# Patient Record
Sex: Female | Born: 1945 | Race: White | Hispanic: No | Marital: Married | Smoking: Never smoker
Health system: Southern US, Community
[De-identification: ages and names within clinical notes are randomized; demographics above are authoritative.]

## PROBLEM LIST (undated history)

## (undated) DIAGNOSIS — B019 Varicella without complication: Secondary | ICD-10-CM

## (undated) DIAGNOSIS — Z9889 Other specified postprocedural states: Secondary | ICD-10-CM

## (undated) DIAGNOSIS — R609 Edema, unspecified: Secondary | ICD-10-CM

## (undated) DIAGNOSIS — R739 Hyperglycemia, unspecified: Secondary | ICD-10-CM

## (undated) DIAGNOSIS — R112 Nausea with vomiting, unspecified: Secondary | ICD-10-CM

## (undated) DIAGNOSIS — R7303 Prediabetes: Secondary | ICD-10-CM

## (undated) DIAGNOSIS — M8580 Other specified disorders of bone density and structure, unspecified site: Secondary | ICD-10-CM

## (undated) DIAGNOSIS — I10 Essential (primary) hypertension: Secondary | ICD-10-CM

## (undated) DIAGNOSIS — E785 Hyperlipidemia, unspecified: Secondary | ICD-10-CM

## (undated) HISTORY — PX: ABDOMINAL HYSTERECTOMY: SHX81

## (undated) HISTORY — PX: CHOLECYSTECTOMY: SHX55

## (undated) HISTORY — PX: BREAST SURGERY: SHX581

## (undated) HISTORY — PX: FRACTURE SURGERY: SHX138

## (undated) HISTORY — PX: OTHER SURGICAL HISTORY: SHX169

## (undated) HISTORY — PX: MANDIBLE FRACTURE SURGERY: SHX706

## (undated) HISTORY — PX: WRIST SURGERY: SHX841

## (undated) HISTORY — PX: FLEXIBLE SIGMOIDOSCOPY: SHX1649

---

## 1999-03-27 HISTORY — PX: BREAST BIOPSY: SHX20

## 1999-03-27 HISTORY — PX: BREAST EXCISIONAL BIOPSY: SUR124

## 2004-07-11 ENCOUNTER — Ambulatory Visit: Payer: Self-pay | Admitting: Internal Medicine

## 2005-10-25 ENCOUNTER — Ambulatory Visit: Payer: Self-pay | Admitting: Internal Medicine

## 2006-10-30 ENCOUNTER — Ambulatory Visit: Payer: Self-pay | Admitting: Internal Medicine

## 2007-03-26 ENCOUNTER — Ambulatory Visit: Payer: Self-pay | Admitting: Physician Assistant

## 2007-11-03 ENCOUNTER — Ambulatory Visit: Payer: Self-pay | Admitting: Internal Medicine

## 2008-11-12 ENCOUNTER — Ambulatory Visit: Payer: Self-pay | Admitting: Internal Medicine

## 2009-11-18 ENCOUNTER — Ambulatory Visit: Payer: Self-pay | Admitting: Internal Medicine

## 2010-04-11 ENCOUNTER — Ambulatory Visit: Payer: Self-pay | Admitting: Gastroenterology

## 2010-12-07 ENCOUNTER — Ambulatory Visit: Payer: Self-pay | Admitting: Internal Medicine

## 2011-12-27 ENCOUNTER — Ambulatory Visit: Payer: Self-pay | Admitting: Internal Medicine

## 2012-07-09 ENCOUNTER — Emergency Department: Payer: Self-pay | Admitting: Emergency Medicine

## 2012-08-08 ENCOUNTER — Encounter: Payer: Self-pay | Admitting: Nurse Practitioner

## 2012-08-08 ENCOUNTER — Encounter: Payer: Self-pay | Admitting: Cardiothoracic Surgery

## 2012-08-24 ENCOUNTER — Encounter: Payer: Self-pay | Admitting: Nurse Practitioner

## 2012-08-24 ENCOUNTER — Encounter: Payer: Self-pay | Admitting: Cardiothoracic Surgery

## 2013-01-29 ENCOUNTER — Ambulatory Visit: Payer: Self-pay | Admitting: Internal Medicine

## 2013-09-26 DIAGNOSIS — R7303 Prediabetes: Secondary | ICD-10-CM | POA: Insufficient documentation

## 2013-09-26 DIAGNOSIS — M8580 Other specified disorders of bone density and structure, unspecified site: Secondary | ICD-10-CM | POA: Insufficient documentation

## 2013-09-26 DIAGNOSIS — I10 Essential (primary) hypertension: Secondary | ICD-10-CM | POA: Insufficient documentation

## 2013-09-26 DIAGNOSIS — R609 Edema, unspecified: Secondary | ICD-10-CM | POA: Insufficient documentation

## 2013-09-26 DIAGNOSIS — E782 Mixed hyperlipidemia: Secondary | ICD-10-CM | POA: Insufficient documentation

## 2013-09-26 DIAGNOSIS — E669 Obesity, unspecified: Secondary | ICD-10-CM | POA: Insufficient documentation

## 2014-02-04 ENCOUNTER — Ambulatory Visit: Payer: Self-pay | Admitting: Internal Medicine

## 2014-06-11 ENCOUNTER — Ambulatory Visit: Payer: Self-pay | Admitting: Internal Medicine

## 2014-06-11 DIAGNOSIS — Z0000 Encounter for general adult medical examination without abnormal findings: Secondary | ICD-10-CM | POA: Insufficient documentation

## 2014-08-30 ENCOUNTER — Encounter: Payer: Self-pay | Admitting: *Deleted

## 2014-08-31 ENCOUNTER — Ambulatory Visit
Admission: RE | Admit: 2014-08-31 | Discharge: 2014-08-31 | Disposition: A | Discharge status: Home / Self Care | Payer: PPO | Source: Ambulatory Visit | Attending: Gastroenterology | Admitting: Gastroenterology

## 2014-08-31 ENCOUNTER — Ambulatory Visit: Payer: PPO | Admitting: Anesthesiology

## 2014-08-31 ENCOUNTER — Encounter: Payer: Self-pay | Admitting: *Deleted

## 2014-08-31 ENCOUNTER — Encounter
Admission: RE | Disposition: A | Discharge status: Home / Self Care | Payer: Self-pay | Source: Ambulatory Visit | Attending: Gastroenterology

## 2014-08-31 DIAGNOSIS — K2970 Gastritis, unspecified, without bleeding: Secondary | ICD-10-CM | POA: Insufficient documentation

## 2014-08-31 DIAGNOSIS — R1013 Epigastric pain: Secondary | ICD-10-CM | POA: Diagnosis not present

## 2014-08-31 DIAGNOSIS — I10 Essential (primary) hypertension: Secondary | ICD-10-CM | POA: Insufficient documentation

## 2014-08-31 DIAGNOSIS — K648 Other hemorrhoids: Secondary | ICD-10-CM | POA: Diagnosis not present

## 2014-08-31 DIAGNOSIS — Z79899 Other long term (current) drug therapy: Secondary | ICD-10-CM | POA: Diagnosis not present

## 2014-08-31 DIAGNOSIS — L83 Acanthosis nigricans: Secondary | ICD-10-CM | POA: Insufficient documentation

## 2014-08-31 DIAGNOSIS — K644 Residual hemorrhoidal skin tags: Secondary | ICD-10-CM | POA: Diagnosis not present

## 2014-08-31 DIAGNOSIS — K317 Polyp of stomach and duodenum: Secondary | ICD-10-CM | POA: Diagnosis not present

## 2014-08-31 DIAGNOSIS — Z7982 Long term (current) use of aspirin: Secondary | ICD-10-CM | POA: Insufficient documentation

## 2014-08-31 DIAGNOSIS — E785 Hyperlipidemia, unspecified: Secondary | ICD-10-CM | POA: Diagnosis not present

## 2014-08-31 HISTORY — DX: Essential (primary) hypertension: I10

## 2014-08-31 HISTORY — PX: ESOPHAGOGASTRODUODENOSCOPY: SHX5428

## 2014-08-31 HISTORY — DX: Varicella without complication: B01.9

## 2014-08-31 HISTORY — DX: Hyperglycemia, unspecified: R73.9

## 2014-08-31 HISTORY — PX: COLONOSCOPY WITH PROPOFOL: SHX5780

## 2014-08-31 HISTORY — DX: Hyperlipidemia, unspecified: E78.5

## 2014-08-31 HISTORY — DX: Other specified disorders of bone density and structure, unspecified site: M85.80

## 2014-08-31 SURGERY — COLONOSCOPY WITH PROPOFOL
Anesthesia: General

## 2014-08-31 MED ORDER — PROPOFOL 10 MG/ML IV BOLUS
INTRAVENOUS | Status: DC | PRN
  Administered 2014-08-31: 30 mg via INTRAVENOUS

## 2014-08-31 MED ORDER — FENTANYL CITRATE (PF) 100 MCG/2ML IJ SOLN
INTRAMUSCULAR | Status: DC | PRN
  Administered 2014-08-31: 50 ug via INTRAVENOUS

## 2014-08-31 MED ORDER — PROPOFOL INFUSION 10 MG/ML OPTIME
INTRAVENOUS | Status: DC | PRN
  Administered 2014-08-31: 75 ug/kg/min via INTRAVENOUS

## 2014-08-31 MED ORDER — EPHEDRINE SULFATE 50 MG/ML IJ SOLN
INTRAMUSCULAR | Status: DC | PRN
  Administered 2014-08-31 (×2): 10 mg via INTRAVENOUS

## 2014-08-31 MED ORDER — LIDOCAINE HCL (PF) 1 % IJ SOLN
2.0000 mL | Freq: Once | INTRAMUSCULAR | Status: AC
  Administered 2014-08-31: 0.05 mL via INTRADERMAL

## 2014-08-31 MED ORDER — ALFENTANIL 500 MCG/ML IJ INJ
INJECTION | INTRAMUSCULAR | Status: DC | PRN
  Administered 2014-08-31: 500 ug via INTRAVENOUS

## 2014-08-31 MED ORDER — GLYCOPYRROLATE 0.2 MG/ML IJ SOLN
INTRAMUSCULAR | Status: DC | PRN
  Administered 2014-08-31: 0.2 mg via INTRAVENOUS

## 2014-08-31 MED ORDER — SODIUM CHLORIDE 0.9 % IV SOLN
INTRAVENOUS | Status: DC
  Administered 2014-08-31: 1000 mL via INTRAVENOUS
  Administered 2014-08-31: 13:00:00 via INTRAVENOUS

## 2014-08-31 MED ORDER — LIDOCAINE HCL (PF) 1 % IJ SOLN
INTRAMUSCULAR | Status: AC
  Administered 2014-08-31: 0.05 mL via INTRADERMAL
  Filled 2014-08-31: qty 2

## 2014-08-31 MED ORDER — SODIUM CHLORIDE 0.9 % IV SOLN: INTRAVENOUS | Status: DC

## 2014-08-31 MED ORDER — MIDAZOLAM HCL 5 MG/5ML IJ SOLN
INTRAMUSCULAR | Status: DC | PRN
  Administered 2014-08-31: 1 mg via INTRAVENOUS

## 2014-08-31 MED ORDER — LIDOCAINE HCL (CARDIAC) 20 MG/ML IV SOLN
INTRAVENOUS | Status: DC | PRN
  Administered 2014-08-31: 100 mg via INTRAVENOUS

## 2014-08-31 NOTE — Transfer of Care (Signed)
Immediate Anesthesia Transfer of Care Note  Patient: Shelly Armstrong  Procedure(s) Performed: Procedure(s): COLONOSCOPY WITH PROPOFOL (N/A) ESOPHAGOGASTRODUODENOSCOPY (EGD) (N/A)  Patient Location: PACU  Anesthesia Type:General  Level of Consciousness: sedated  Airway & Oxygen Therapy: Patient Spontanous Breathing and Patient connected to nasal cannula oxygen  Post-op Assessment: Report given to RN and Post -op Vital signs reviewed and stable  Post vital signs: Reviewed and stable  Last Vitals:  Filed Vitals:   08/31/14 1430  BP: 97/54  Pulse: 62  Temp: 36 C  Resp: 11    Complications: No apparent anesthesia complications

## 2014-08-31 NOTE — H&P (Signed)
Outpatient short stay form Pre-procedure 08/31/2014 12:37 PM Lollie Sails MD  Primary Physician: Dr. Frazier Richards  Reason for visit:  Recent episode of diverticulitis treated with Cipro and Flagyl and recurrent requiring a second course of anti-biotics. Abnormal CT scan at that time. History of dyspepsia and intermittent epigastric pain. The epigastric abdominal pain may only occur 3 or 4 times a year and will last only for half an hour at a time. It is quite intense when it happens however. Never lasted longer enough for her to go to the hospital with it. He does have a history of a cholecystectomy remote done laparoscopically. He suddenly she had several bouts of diverticulitis requiring biotics. Her CT scan was done on 06/11/2014 for lower abdominal pain nausea vomiting diarrhea interpretation showed a 5-6 cm area of moderate to severe inflammation affecting the sigmoid and sigmoid mesentery. Palpable with acute diverticulitis however follow-up" neoplasm was recommended. There were no other acute or inflammatory findings in the abdomen or pelvis. No Dilated or inflamed loops of small bowel.  History of present illness:  As above    Current facility-administered medications:  .  0.9 %  sodium chloride infusion, , Intravenous, Continuous, Lollie Sails, MD .  0.9 %  sodium chloride infusion, , Intravenous, Continuous, Lollie Sails, MD .  lidocaine (PF) (XYLOCAINE) 1 % injection 2 mL, 2 mL, Intradermal, Once, Lollie Sails, MD  Prescriptions prior to admission  Medication Sig Dispense Refill Last Dose  . acetaminophen (TYLENOL) 500 MG tablet Take 1,000 mg by mouth every 6 (six) hours as needed for moderate pain.     Marland Kitchen aspirin 81 MG tablet Take 81 mg by mouth daily.     . Calcium-Vitamin D (CALTRATE 600 PLUS-VIT D PO) Take 1 tablet by mouth 2 (two) times daily.     Marland Kitchen losartan-hydrochlorothiazide (HYZAAR) 100-25 MG per tablet Take 1 tablet by mouth daily.   08/31/2014 at 07300   . Multiple Vitamin (MULTIVITAMIN WITH MINERALS) TABS tablet Take 1 tablet by mouth daily.     Marland Kitchen omeprazole (PRILOSEC) 40 MG capsule Take 40 mg by mouth daily.     . pravastatin (PRAVACHOL) 40 MG tablet Take 40 mg by mouth daily.        Allergies  Allergen Reactions  . Septra [Sulfamethoxazole-Trimethoprim]      Past Medical History  Diagnosis Date  . Hypertension   . Hyperlipidemia   . Hyperglycemia   . Osteopenia   . Chickenpox     Review of systems:      Physical Exam    Heart and lungs: Regular rate and rhythm without rub or gallop lungs are bilaterally clear    HEENT: Normocephalic atraumatic eyes are anicteric    Other:     Pertinant exam for procedure: Soft nontender nondistended bowel sounds positive and normal active    Planned proceedures: EGD and colonoscopy. I have discussed the risks benefits and complications of procedures to include not limited to bleeding, infection, perforation and the risk of sedation and the patient wishes to proceed. I have discussed the risks benefits and complications of procedures to include not limited to bleeding, infection, perforation and the risk of sedation and the patient wishes to proceed.    Lollie Sails, MD Gastroenterology 08/31/2014  12:37 PM

## 2014-08-31 NOTE — Anesthesia Postprocedure Evaluation (Signed)
  Anesthesia Post-op Note  Patient: Shelly Armstrong  Procedure(s) Performed: Procedure(s): COLONOSCOPY WITH PROPOFOL (N/A) ESOPHAGOGASTRODUODENOSCOPY (EGD) (N/A)  Anesthesia type:General  Patient location: PACU  Post pain: Pain level controlled  Post assessment: Post-op Vital signs reviewed, Patient's Cardiovascular Status Stable, Respiratory Function Stable, Patent Airway and No signs of Nausea or vomiting  Post vital signs: Reviewed and stable  Last Vitals:  Filed Vitals:   08/31/14 1458  BP: 146/57  Pulse: 57  Temp:   Resp: 9    Level of consciousness: awake, alert  and patient cooperative  Complications: No apparent anesthesia complications

## 2014-08-31 NOTE — Op Note (Addendum)
[facility] Gastroenterology Patient Name: Shelly Armstrong Procedure Date: 08/31/2014 12:58 PM MRN: [id number] Account #: [id number] Date of Birth: 1945/06/07 Admit Type: Outpatient Age: 69 Room: [facility] ENDO ROOM 3 Gender: Female Note Status: Supervisor Override Procedure:         Upper GI endoscopy Indications:       Epigastric abdominal pain Providers:         Lollie Sails, MD Referring MD:      Ocie Cornfield. Ouida Sills, MD (Referring MD) Medicines:         Monitored Anesthesia Care Complications:     No immediate complications. Small ecchymosis noted in the                     upper esophagus from scope passage, possible small web or                     ring opened . Procedure:         Pre-Anesthesia Assessment:                    - ASA Grade Assessment: II - A patient with mild systemic                     disease.                    After obtaining informed consent, the endoscope was passed                     under direct vision. Throughout the procedure, the                     patient's blood pressure, pulse, and oxygen saturations                     were monitored continuously. The Olympus GIF-160 endoscope                     (S#. [id number]) was introduced through the mouth, and                     advanced to the third part of duodenum. The upper GI                     endoscopy was accomplished without difficulty. The patient                     tolerated the procedure well. The patient tolerated the                     procedure well. Findings:      The Z-line was regular. Biopsies were taken with a cold forceps for       histology. The distal esophagus is noted to be tortuous.      The exam of the esophagus was otherwise normal.      Patchy minimal inflammation characterized by erythema was found in the       gastric body and in the gastric antrum. Biopsies were taken with a cold       forceps for histology. Biopsies were taken with a cold forceps  for       histology.      Multiple pale plaque-like sessile polyps with no bleeding and no       stigmata of recent bleeding were  found in the gastric body. Biopsies       were taken with a cold forceps for histology.      The examined duodenum was normal.      The cardia and gastric fundus were normal on retroflexion otherwise.      The scope was reintroduced to remove a small squamous papilloma in the       distal third of the esophagus. On Withdrawl of the scope there is       noticed a small mucosal ecchymosis above an inlet patch in the upper       third of the esophagus, no deeper defect was seen. Impression:        - Z-line regular. Biopsied.                    - Bile gastritis. Biopsied.                    - Multiple gastric polyps. Biopsied.                    - Normal examined duodenum.                    -small papillomatous lesion in the distal third of the                     esophagus removed. Recommendation:    - Await pathology results.                    - Return to GI clinic in 3 weeks.                    - Clear liquid diet today.                    - Full liquid diet for 2 days. Procedure Code(s): --- Professional ---                    [phone], Esophagogastroduodenoscopy, flexible, transoral;                     with biopsy, single or multiple Diagnosis Code(s): --- Professional ---                    535.40, Other specified gastritis, without mention of                     hemorrhage                    211.1, Benign neoplasm of stomach                    789.06, Abdominal pain, epigastric CPT copyright 2014 American Medical Association. All rights reserved. The codes documented in this report are preliminary and upon coder review may  be revised to meet current compliance requirements. Lollie Sails, MD 08/31/2014 1:24:45 PM This report has been signed electronically. Number of Addenda: 0 Note Initiated On: 08/31/2014 12:58 PM      [facility]

## 2014-08-31 NOTE — Anesthesia Preprocedure Evaluation (Signed)
Anesthesia Evaluation  Patient identified by MRN, date of birth, ID band  Reviewed: Allergy & Precautions, NPO status , Patient's Chart, lab work & pertinent test results  Airway Mallampati: II   Neck ROM: Full    Dental  (+) Upper Dentures, Lower Dentures   Pulmonary          Cardiovascular hypertension, Pt. on medications     Neuro/Psych    GI/Hepatic   Endo/Other    Renal/GU      Musculoskeletal   Abdominal   Peds  Hematology   Anesthesia Other Findings   Reproductive/Obstetrics                             Anesthesia Physical Anesthesia Plan  ASA: II  Anesthesia Plan: General   Post-op Pain Management:    Induction: Intravenous  Airway Management Planned: Nasal Cannula  Additional Equipment:   Intra-op Plan:   Post-operative Plan:   Informed Consent: I have reviewed the patients History and Physical, chart, labs and discussed the procedure including the risks, benefits and alternatives for the proposed anesthesia with the patient or authorized representative who has indicated his/her understanding and acceptance.     Plan Discussed with:   Anesthesia Plan Comments:         Anesthesia Quick Evaluation

## 2014-08-31 NOTE — Op Note (Signed)
[facility] Gastroenterology Patient Name: Shelly Armstrong Procedure Date: 08/31/2014 12:58 PM MRN: [id number] Account #: [id number] Date of Birth: 04-17-1945 Admit Type: Outpatient Age: 69 Room: [facility] ENDO ROOM 3 Gender: Female Note Status: Finalized Procedure:         Colonoscopy Indications:       Epigastric abdominal pain, Abnormal CT of the GI tract,                     Follow-up of diverticulitis Providers:         Lollie Sails, MD Referring MD:      Ocie Cornfield. Ouida Sills, MD (Referring MD) Medicines:         Propofol per Anesthesia, Monitored Anesthesia Care Complications:     No immediate complications. Procedure:         Pre-Anesthesia Assessment:                    - ASA Grade Assessment: III - A patient with severe                     systemic disease.                    After obtaining informed consent, the colonoscope was                     passed under direct vision. Throughout the procedure, the                     patient's blood pressure, pulse, and oxygen saturations                     were monitored continuously. The Colonoscope was                     introduced through the anus and advanced to the the cecum,                     identified by appendiceal orifice and ileocecal valve. The                     colonoscopy was unusually difficult due to significant                     looping and a tortuous colon. Successful completion of the                     procedure was aided by changing the patient to a supine                     position, changing the patient to a prone position and                     using manual pressure. The patient tolerated the procedure                     well. The quality of the bowel preparation was good. Findings:      The sigmoid colon, descending colon, splenic flexure, transverse colon       and hepatic flexure were significantly tortuous.      Multiple medium-mouthed diverticula were found in the sigmoid  colon, in       the descending colon and in the transverse colon.      Non-bleeding external and  internal hemorrhoids were found during       retroflexion, during perianal exam and during anoscopy. The hemorrhoids       were small. Impression:        - Tortuous colon.                    - Diverticulosis in the sigmoid colon, in the descending                     colon and in the transverse colon.                    - Non-bleeding external and internal hemorrhoids.                    - No specimens collected. Recommendation:    - Repeat colonoscopy in 10 years for screening purposes. Procedure Code(s): --- Professional ---                    [phone], Colonoscopy, flexible; diagnostic, including                     collection of specimen(s) by brushing or washing, when                     performed (separate procedure) Diagnosis Code(s): --- Professional ---                    455.0, Internal hemorrhoids without mention of complication                    455.3, External hemorrhoids without mention of complication                    789.06, Abdominal pain, epigastric                    562.11, Diverticulitis of colon (without mention of                     hemorrhage)                    751.5, Other anomalies of intestine                    793.4, Nonspecific (abnormal) findings on radiological and                     other examination of gastrointestinal tract CPT copyright 2014 American Medical Association. All rights reserved. The codes documented in this report are preliminary and upon coder review may  be revised to meet current compliance requirements. Lollie Sails, MD 08/31/2014 2:07:08 PM This report has been signed electronically. Number of Addenda: 0 Note Initiated On: 08/31/2014 12:58 PM Scope Withdrawal Time: 0 hours 8 minutes 58 seconds  Total Procedure Duration: 0 hours 35 minutes 0 seconds       [facility]

## 2014-09-02 ENCOUNTER — Encounter: Payer: Self-pay | Admitting: Gastroenterology

## 2014-09-02 LAB — SURGICAL PATHOLOGY

## 2014-10-14 ENCOUNTER — Other Ambulatory Visit: Payer: PPO

## 2014-10-18 ENCOUNTER — Encounter: Payer: Self-pay | Admitting: *Deleted

## 2014-10-18 DIAGNOSIS — I10 Essential (primary) hypertension: Secondary | ICD-10-CM | POA: Diagnosis not present

## 2014-10-18 DIAGNOSIS — Z79899 Other long term (current) drug therapy: Secondary | ICD-10-CM | POA: Diagnosis not present

## 2014-10-18 DIAGNOSIS — H2511 Age-related nuclear cataract, right eye: Secondary | ICD-10-CM | POA: Diagnosis not present

## 2014-10-18 DIAGNOSIS — Z7982 Long term (current) use of aspirin: Secondary | ICD-10-CM | POA: Diagnosis not present

## 2014-10-18 DIAGNOSIS — E119 Type 2 diabetes mellitus without complications: Secondary | ICD-10-CM | POA: Diagnosis not present

## 2014-10-19 ENCOUNTER — Ambulatory Visit: Payer: PPO | Admitting: Anesthesiology

## 2014-10-19 ENCOUNTER — Ambulatory Visit
Admission: RE | Admit: 2014-10-19 | Discharge: 2014-10-19 | Disposition: A | Discharge status: Home / Self Care | Payer: PPO | Source: Ambulatory Visit | Attending: Ophthalmology | Admitting: Ophthalmology

## 2014-10-19 ENCOUNTER — Encounter
Admission: RE | Disposition: A | Discharge status: Home / Self Care | Payer: Self-pay | Source: Ambulatory Visit | Attending: Ophthalmology

## 2014-10-19 ENCOUNTER — Encounter: Payer: Self-pay | Admitting: *Deleted

## 2014-10-19 DIAGNOSIS — Z7982 Long term (current) use of aspirin: Secondary | ICD-10-CM | POA: Insufficient documentation

## 2014-10-19 DIAGNOSIS — H2511 Age-related nuclear cataract, right eye: Secondary | ICD-10-CM | POA: Insufficient documentation

## 2014-10-19 DIAGNOSIS — E119 Type 2 diabetes mellitus without complications: Secondary | ICD-10-CM | POA: Insufficient documentation

## 2014-10-19 DIAGNOSIS — I10 Essential (primary) hypertension: Secondary | ICD-10-CM | POA: Insufficient documentation

## 2014-10-19 DIAGNOSIS — Z79899 Other long term (current) drug therapy: Secondary | ICD-10-CM | POA: Insufficient documentation

## 2014-10-19 HISTORY — PX: CATARACT EXTRACTION W/PHACO: SHX586

## 2014-10-19 HISTORY — DX: Nausea with vomiting, unspecified: R11.2

## 2014-10-19 HISTORY — DX: Other specified postprocedural states: Z98.890

## 2014-10-19 HISTORY — DX: Edema, unspecified: R60.9

## 2014-10-19 SURGERY — PHACOEMULSIFICATION, CATARACT, WITH IOL INSERTION
Anesthesia: Monitor Anesthesia Care | Laterality: Right

## 2014-10-19 MED ORDER — CEFUROXIME OPHTHALMIC INJECTION 1 MG/0.1 ML
INJECTION | OPHTHALMIC | Status: AC
  Filled 2014-10-19: qty 0.1

## 2014-10-19 MED ORDER — MIDAZOLAM HCL 2 MG/2ML IJ SOLN
INTRAMUSCULAR | Status: DC | PRN
  Administered 2014-10-19: 1 mg via INTRAVENOUS

## 2014-10-19 MED ORDER — EPINEPHRINE HCL 1 MG/ML IJ SOLN
INTRAOCULAR | Status: DC | PRN
  Administered 2014-10-19: 250 mL

## 2014-10-19 MED ORDER — CARBACHOL 0.01 % IO SOLN
INTRAOCULAR | Status: DC | PRN
  Administered 2014-10-19: .5 mL via INTRAOCULAR

## 2014-10-19 MED ORDER — NA CHONDROIT SULF-NA HYALURON 40-17 MG/ML IO SOLN
INTRAOCULAR | Status: AC
  Filled 2014-10-19: qty 1

## 2014-10-19 MED ORDER — FENTANYL CITRATE (PF) 100 MCG/2ML IJ SOLN
INTRAMUSCULAR | Status: DC | PRN
  Administered 2014-10-19: 50 ug via INTRAVENOUS

## 2014-10-19 MED ORDER — POVIDONE-IODINE 5 % OP SOLN
1.0000 "application " | OPHTHALMIC | Status: AC | PRN
  Administered 2014-10-19: 1 via OPHTHALMIC

## 2014-10-19 MED ORDER — CEFUROXIME OPHTHALMIC INJECTION 1 MG/0.1 ML
INJECTION | OPHTHALMIC | Status: DC | PRN
  Administered 2014-10-19: 0.1 mL via INTRACAMERAL

## 2014-10-19 MED ORDER — MOXIFLOXACIN HCL 0.5 % OP SOLN: 1.0000 [drp] | Freq: Once | OPHTHALMIC | Status: DC

## 2014-10-19 MED ORDER — POVIDONE-IODINE 5 % OP SOLN
OPHTHALMIC | Status: AC
  Filled 2014-10-19: qty 30

## 2014-10-19 MED ORDER — TETRACAINE HCL 0.5 % OP SOLN
OPHTHALMIC | Status: AC
  Filled 2014-10-19: qty 2

## 2014-10-19 MED ORDER — MOXIFLOXACIN HCL 0.5 % OP SOLN
OPHTHALMIC | Status: AC
  Filled 2014-10-19: qty 3

## 2014-10-19 MED ORDER — TETRACAINE HCL 0.5 % OP SOLN
1.0000 [drp] | OPHTHALMIC | Status: AC | PRN
  Administered 2014-10-19: 1 [drp] via OPHTHALMIC

## 2014-10-19 MED ORDER — ARMC OPHTHALMIC DILATING GEL
1.0000 "application " | OPHTHALMIC | Status: AC | PRN
  Administered 2014-10-19 (×2): 1 via OPHTHALMIC

## 2014-10-19 MED ORDER — EPINEPHRINE HCL 1 MG/ML IJ SOLN
INTRAMUSCULAR | Status: AC
  Filled 2014-10-19: qty 1

## 2014-10-19 MED ORDER — MOXIFLOXACIN HCL 0.5 % OP SOLN
OPHTHALMIC | Status: DC | PRN
  Administered 2014-10-19: 2 [drp] via OPHTHALMIC

## 2014-10-19 MED ORDER — SODIUM CHLORIDE 0.9 % IV SOLN
INTRAVENOUS | Status: DC
  Administered 2014-10-19: 11:00:00 via INTRAVENOUS

## 2014-10-19 SURGICAL SUPPLY — 19 items
CANNULA ANT/CHMB 27GA (MISCELLANEOUS) ×3 IMPLANT
GLOVE BIO SURGEON STRL SZ8 (GLOVE) ×6 IMPLANT
GLOVE BIOGEL M 6.5 STRL (GLOVE) ×3 IMPLANT
GLOVE SURG LX 8.0 MICRO (GLOVE) ×2
GLOVE SURG LX STRL 8.0 MICRO (GLOVE) ×1 IMPLANT
GOWN STRL REUS W/ TWL LRG LVL3 (GOWN DISPOSABLE) ×2 IMPLANT
GOWN STRL REUS W/TWL LRG LVL3 (GOWN DISPOSABLE) ×4
LENS IOL TECNIS 29.5 (Intraocular Lens) ×3 IMPLANT
PACK CATARACT (MISCELLANEOUS) ×3 IMPLANT
PACK CATARACT BRASINGTON LX (MISCELLANEOUS) ×3 IMPLANT
PACK EYE AFTER SURG (MISCELLANEOUS) ×3 IMPLANT
SOL BSS BAG (MISCELLANEOUS) ×3
SOL PREP PVP 2OZ (MISCELLANEOUS)
SOLUTION BSS BAG (MISCELLANEOUS) ×1 IMPLANT
SOLUTION PREP PVP 2OZ (MISCELLANEOUS) IMPLANT
SYR 5ML LL (SYRINGE) ×3 IMPLANT
SYR TB 1ML 27GX1/2 LL (SYRINGE) ×3 IMPLANT
WATER STERILE IRR 1000ML POUR (IV SOLUTION) ×3 IMPLANT
WIPE NON LINTING 3.25X3.25 (MISCELLANEOUS) ×3 IMPLANT

## 2014-10-19 NOTE — Transfer of Care (Signed)
Immediate Anesthesia Transfer of Care Note  Patient: Shelly Armstrong  Procedure(s) Performed: Procedure(s) with comments: CATARACT EXTRACTION PHACO AND INTRAOCULAR LENS PLACEMENT (IOC) (Right) - casette lot# [id number]   [place]  00:29 AP17.7 CDE 5.09  Patient Location: PACU  Anesthesia Type:MAC  Level of Consciousness: awake, alert , oriented and patient cooperative  Airway & Oxygen Therapy: Patient Spontanous Breathing  Post-op Assessment: Report given to RN and Post -op Vital signs reviewed and stable  Post vital signs: Reviewed and stable  Last Vitals:  Filed Vitals:   10/19/14 1141  BP: 149/66  Pulse:   Temp: 36.5 C  Resp: 16    Complications: No apparent anesthesia complications

## 2014-10-19 NOTE — Anesthesia Preprocedure Evaluation (Signed)
Anesthesia Evaluation  Patient identified by MRN, date of birth, ID band Patient awake    Reviewed: Allergy & Precautions, H&P , NPO status , Patient's Chart, lab work & pertinent test results, reviewed documented beta blocker date and time   History of Anesthesia Complications (+) PONV and history of anesthetic complications  Airway Mallampati: II  TM Distance: >3 FB Neck ROM: full    Dental no notable dental hx. (+) Teeth Intact   Pulmonary neg pulmonary ROS,  breath sounds clear to auscultation  Pulmonary exam normal       Cardiovascular Exercise Tolerance: Good hypertension, negative cardio ROS  Rhythm:regular Rate:Normal     Neuro/Psych negative neurological ROS  negative psych ROS   GI/Hepatic negative GI ROS, Neg liver ROS,   Endo/Other  negative endocrine ROSdiabetes  Renal/GU      Musculoskeletal   Abdominal   Peds  Hematology negative hematology ROS (+)   Anesthesia Other Findings   Reproductive/Obstetrics negative OB ROS                             Anesthesia Physical Anesthesia Plan  ASA: II  Anesthesia Plan: MAC   Post-op Pain Management:    Induction:   Airway Management Planned:   Additional Equipment:   Intra-op Plan:   Post-operative Plan:   Informed Consent: I have reviewed the patients History and Physical, chart, labs and discussed the procedure including the risks, benefits and alternatives for the proposed anesthesia with the patient or authorized representative who has indicated his/her understanding and acceptance.     Plan Discussed with: CRNA  Anesthesia Plan Comments:         Anesthesia Quick Evaluation

## 2014-10-19 NOTE — H&P (Signed)
  All labs reviewed. Abnormal studies sent to patients PCP when indicated.  Previous H&P reviewed, patient examined, there are NO CHANGES.  Shelly Armstrong LOUIS7/26/201611:14 AM

## 2014-10-19 NOTE — Op Note (Signed)
PREOPERATIVE DIAGNOSIS:  Nuclear sclerotic cataract of the right eye.   POSTOPERATIVE DIAGNOSIS: nuclear slerotic cataract  right eye   OPERATIVE PROCEDURE:  Procedure(s): CATARACT EXTRACTION PHACO AND INTRAOCULAR LENS PLACEMENT (IOC)   SURGEON:  Birder Robson, MD.   ANESTHESIA:  Anesthesiologist: Molli Barrows, MD CRNA: Bernardo Heater, CRNA  1.      Managed anesthesia care. 2.      Topical tetracaine drops followed by 2% Xylocaine jelly applied in the preoperative holding area.   COMPLICATIONS:  None.   TECHNIQUE:   Stop and chop   DESCRIPTION OF PROCEDURE:  The patient was examined and consented in the preoperative holding area where the aforementioned topical anesthesia was applied to the right eye and then brought back to the Operating Room where the right eye was prepped and draped in the usual sterile ophthalmic fashion and a lid speculum was placed. A paracentesis was created with the side port blade and the anterior chamber was filled with viscoelastic. A near clear corneal incision was performed with the steel keratome. A continuous curvilinear capsulorrhexis was performed with a cystotome followed by the capsulorrhexis forceps. Hydrodissection and hydrodelineation were carried out with BSS on a blunt cannula. The lens was removed in a stop and chop  technique and the remaining cortical material was removed with the irrigation-aspiration handpiece. The capsular bag was inflated with viscoelastic and the Technis [id number]  lens was placed in the capsular bag without complication. The remaining viscoelastic was removed from the eye with the irrigation-aspiration handpiece. The wounds were hydrated. The anterior chamber was flushed with Miostat and the eye was inflated to physiologic pressure. 0.1 mL of cefuroxime concentration 10 mg/mL was placed in the anterior chamber. The wounds were found to be water tight. The eye was dressed with Vigamox. The patient was given protective glasses to  wear throughout the day and a shield with which to sleep tonight. The patient was also given drops with which to begin a drop regimen today and will follow-up with me in one day.  Implant Name Type Inv. Item Serial No. Manufacturer Lot No. LRB No. Used  LENS IOL TECNIS  29.5 - [id number] Intraocular Lens LENS IOL TECNIS  29.5 [id number] AMO   Right 1   Procedure(s) with comments: CATARACT EXTRACTION PHACO AND INTRAOCULAR LENS PLACEMENT (IOC) (Right) - casette lot# [id number]   [place]  00:29 AP17.7 CDE 5.09  Electronically signed: Shanena Pellegrino LOUIS 10/19/2014 11:39 AM

## 2014-10-19 NOTE — Anesthesia Postprocedure Evaluation (Signed)
  Anesthesia Post-op Note  Patient: Shelly Armstrong  Procedure(s) Performed: Procedure(s) with comments: CATARACT EXTRACTION PHACO AND INTRAOCULAR LENS PLACEMENT (IOC) (Right) - casette lot# [id number]   [place]  00:29 AP17.7 CDE 5.09  Anesthesia type:MAC  Patient location: PACU  Post pain: Pain level controlled  Post assessment: Post-op Vital signs reviewed, Patient's Cardiovascular Status Stable, Respiratory Function Stable, Patent Airway and No signs of Nausea or vomiting  Post vital signs: Reviewed and stable  Last Vitals:  Filed Vitals:   10/19/14 1141  BP: 149/66  Pulse:   Temp: 36.5 C  Resp: 16    Level of consciousness: awake, alert  and patient cooperative  Complications: No apparent anesthesia complications

## 2014-10-19 NOTE — Discharge Instructions (Signed)
AMBULATORY SURGERY  °DISCHARGE INSTRUCTIONS ° ° °1) The drugs that you were given will stay in your system until tomorrow so for the next 24 hours you should not: ° °A) Drive an automobile °B) Make any legal decisions °C) Drink any alcoholic beverage ° ° °2) You may resume regular meals tomorrow.  Today it is better to start with liquids and gradually work up to solid foods. ° °You may eat anything you prefer, but it is better to start with liquids, then soup and crackers, and gradually work up to solid foods. ° ° °3) Please notify your doctor immediately if you have any unusual bleeding, trouble breathing, redness and pain at the surgery site, drainage, fever, or pain not relieved by medication. ° ° ° °4) Additional Instructions: ° ° ° °Cataract Surgery °Care After °Refer to this sheet in the next few weeks. These instructions provide you with information on caring for yourself after your procedure. Your caregiver may also give you more specific instructions. Your treatment has been planned according to current medical practices, but problems sometimes occur. Call your caregiver if you have any problems or questions after your procedure.  °HOME CARE INSTRUCTIONS  °· Avoid strenuous activities as directed by your caregiver. °· Ask your caregiver when you can resume driving. °· Use eyedrops or other medicines to help healing and control pressure inside your eye as directed by your caregiver. °· Only take over-the-counter or prescription medicines for pain, discomfort, or fever as directed by your caregiver. °· Do not to touch or rub your eyes. °· You may be instructed to use a protective shield during the first few days and nights after surgery. If not, wear sunglasses to protect your eyes. This is to protect the eye from pressure or from being accidentally bumped. °· Keep the area around your eye clean and dry. Avoid swimming or allowing water to hit you directly in the face while showering. Keep soap and shampoo  out of your eyes. °· Do not bend or lift heavy objects. Bending increases pressure in the eye. You can walk, climb stairs, and do light household chores. °· Do not put a contact lens into the eye that had surgery until your caregiver says it is okay to do so. °· Ask your doctor when you can return to work. This will depend on the kind of work that you do. If you work in a dusty environment, you may be advised to wear protective eyewear for a period of time. °· Ask your caregiver when it will be safe to engage in sexual activity. °· Continue with your regular eye exams as directed by your caregiver. °What to expect: °· It is normal to feel itching and mild discomfort for a few days after cataract surgery. Some fluid discharge is also common, and your eye may be sensitive to light and touch. °· After 1 to 2 days, even moderate discomfort should disappear. In most cases, healing will take about 6 weeks. °· If you received an intraocular lens (IOL), you may notice that colors are very bright or have a blue tinge. Also, if you have been in bright sunlight, everything may appear reddish for a few hours. If you see these color tinges, it is because your lens is clear and no longer cloudy. Within a few months after receiving an IOL, these extra colors should go away. When you have healed, you will probably need new glasses. °SEEK MEDICAL CARE IF:  °· You have increased bruising around your   eye. °· You have discomfort not helped by medicine. °SEEK IMMEDIATE MEDICAL CARE IF:  °· You have a  fever. °· You have a worsening or sudden vision loss. °· You have redness, swelling, or increasing pain in the eye. °· You have a thick discharge from the eye that had surgery. °MAKE SURE YOU: °· Understand these instructions. °· Will watch your condition. °· Will get help right away if you are not doing well or get worse. °Document Released: 09/29/2004 Document Revised: 06/04/2011 Document Reviewed: 11/03/2010 °[employer]® Patient  Information ©2015 [employer]. This information is not intended to replace advice given to you by your health care provider. Make sure you discuss any questions you have with your health care provider. ° ° ° ° °Please contact your physician with any problems or Same Day Surgery at [phone], Monday through Friday 6 am to 4 pm, or [place] [facility] [place] Main number at [phone]. °

## 2014-11-01 ENCOUNTER — Encounter: Payer: Self-pay | Admitting: *Deleted

## 2014-11-01 DIAGNOSIS — E780 Pure hypercholesterolemia: Secondary | ICD-10-CM | POA: Diagnosis not present

## 2014-11-01 DIAGNOSIS — Z9841 Cataract extraction status, right eye: Secondary | ICD-10-CM | POA: Diagnosis not present

## 2014-11-01 DIAGNOSIS — I10 Essential (primary) hypertension: Secondary | ICD-10-CM | POA: Diagnosis not present

## 2014-11-01 DIAGNOSIS — Z882 Allergy status to sulfonamides status: Secondary | ICD-10-CM | POA: Diagnosis not present

## 2014-11-01 DIAGNOSIS — Z79899 Other long term (current) drug therapy: Secondary | ICD-10-CM | POA: Diagnosis not present

## 2014-11-01 DIAGNOSIS — H2512 Age-related nuclear cataract, left eye: Secondary | ICD-10-CM | POA: Diagnosis present

## 2014-11-01 DIAGNOSIS — Z7982 Long term (current) use of aspirin: Secondary | ICD-10-CM | POA: Diagnosis not present

## 2014-11-01 DIAGNOSIS — Z9049 Acquired absence of other specified parts of digestive tract: Secondary | ICD-10-CM | POA: Diagnosis not present

## 2014-11-01 DIAGNOSIS — Z9889 Other specified postprocedural states: Secondary | ICD-10-CM | POA: Diagnosis not present

## 2014-11-02 ENCOUNTER — Ambulatory Visit: Payer: PPO | Admitting: Anesthesiology

## 2014-11-02 ENCOUNTER — Encounter: Payer: Self-pay | Admitting: *Deleted

## 2014-11-02 ENCOUNTER — Encounter
Admission: RE | Disposition: A | Discharge status: Home / Self Care | Payer: Self-pay | Source: Ambulatory Visit | Attending: Ophthalmology

## 2014-11-02 ENCOUNTER — Ambulatory Visit
Admission: RE | Admit: 2014-11-02 | Discharge: 2014-11-02 | Disposition: A | Discharge status: Home / Self Care | Payer: PPO | Source: Ambulatory Visit | Attending: Ophthalmology | Admitting: Ophthalmology

## 2014-11-02 DIAGNOSIS — Z9889 Other specified postprocedural states: Secondary | ICD-10-CM | POA: Insufficient documentation

## 2014-11-02 DIAGNOSIS — Z9049 Acquired absence of other specified parts of digestive tract: Secondary | ICD-10-CM | POA: Insufficient documentation

## 2014-11-02 DIAGNOSIS — H2512 Age-related nuclear cataract, left eye: Secondary | ICD-10-CM | POA: Insufficient documentation

## 2014-11-02 DIAGNOSIS — I10 Essential (primary) hypertension: Secondary | ICD-10-CM | POA: Insufficient documentation

## 2014-11-02 DIAGNOSIS — E780 Pure hypercholesterolemia: Secondary | ICD-10-CM | POA: Insufficient documentation

## 2014-11-02 DIAGNOSIS — Z7982 Long term (current) use of aspirin: Secondary | ICD-10-CM | POA: Insufficient documentation

## 2014-11-02 DIAGNOSIS — Z9841 Cataract extraction status, right eye: Secondary | ICD-10-CM | POA: Insufficient documentation

## 2014-11-02 DIAGNOSIS — Z882 Allergy status to sulfonamides status: Secondary | ICD-10-CM | POA: Insufficient documentation

## 2014-11-02 DIAGNOSIS — Z79899 Other long term (current) drug therapy: Secondary | ICD-10-CM | POA: Insufficient documentation

## 2014-11-02 HISTORY — PX: CATARACT EXTRACTION W/PHACO: SHX586

## 2014-11-02 SURGERY — PHACOEMULSIFICATION, CATARACT, WITH IOL INSERTION
Anesthesia: Monitor Anesthesia Care | Site: Eye | Laterality: Left | Wound class: Clean

## 2014-11-02 MED ORDER — POVIDONE-IODINE 5 % OP SOLN
1.0000 "application " | OPHTHALMIC | Status: AC | PRN
  Administered 2014-11-02: 1 via OPHTHALMIC

## 2014-11-02 MED ORDER — ARMC OPHTHALMIC DILATING GEL
1.0000 "application " | OPHTHALMIC | Status: DC | PRN
  Administered 2014-11-02: 1 via OPHTHALMIC

## 2014-11-02 MED ORDER — FENTANYL CITRATE (PF) 100 MCG/2ML IJ SOLN
INTRAMUSCULAR | Status: DC | PRN
  Administered 2014-11-02: 50 ug via INTRAVENOUS

## 2014-11-02 MED ORDER — TETRACAINE HCL 0.5 % OP SOLN
OPHTHALMIC | Status: AC
  Administered 2014-11-02: 1 [drp] via OPHTHALMIC
  Filled 2014-11-02: qty 2

## 2014-11-02 MED ORDER — NA CHONDROIT SULF-NA HYALURON 40-17 MG/ML IO SOLN
INTRAOCULAR | Status: DC | PRN
  Administered 2014-11-02: 1 mL via INTRAOCULAR

## 2014-11-02 MED ORDER — ONDANSETRON HCL 4 MG/2ML IJ SOLN
INTRAMUSCULAR | Status: DC | PRN
Start: 2014-11-02 — End: 2014-11-02
  Administered 2014-11-02: 4 mg via INTRAVENOUS

## 2014-11-02 MED ORDER — SODIUM CHLORIDE 0.9 % IV SOLN
INTRAVENOUS | Status: DC
  Administered 2014-11-02 (×2): via INTRAVENOUS

## 2014-11-02 MED ORDER — MOXIFLOXACIN HCL 0.5 % OP SOLN
OPHTHALMIC | Status: AC
  Filled 2014-11-02: qty 3

## 2014-11-02 MED ORDER — EPINEPHRINE HCL 1 MG/ML IJ SOLN
INTRAMUSCULAR | Status: AC
  Filled 2014-11-02: qty 1

## 2014-11-02 MED ORDER — TETRACAINE HCL 0.5 % OP SOLN
1.0000 [drp] | OPHTHALMIC | Status: AC | PRN
  Administered 2014-11-02: 1 [drp] via OPHTHALMIC

## 2014-11-02 MED ORDER — CEFUROXIME OPHTHALMIC INJECTION 1 MG/0.1 ML
INJECTION | OPHTHALMIC | Status: AC
  Filled 2014-11-02: qty 0.1

## 2014-11-02 MED ORDER — EPINEPHRINE HCL 1 MG/ML IJ SOLN
INTRAOCULAR | Status: DC | PRN
  Administered 2014-11-02: 200 mL via OPHTHALMIC

## 2014-11-02 MED ORDER — CARBACHOL 0.01 % IO SOLN
INTRAOCULAR | Status: DC | PRN
  Administered 2014-11-02: 0.5 mL via INTRAOCULAR

## 2014-11-02 MED ORDER — NA CHONDROIT SULF-NA HYALURON 40-17 MG/ML IO SOLN
INTRAOCULAR | Status: AC
  Filled 2014-11-02: qty 1

## 2014-11-02 MED ORDER — MOXIFLOXACIN HCL 0.5 % OP SOLN
OPHTHALMIC | Status: DC | PRN
  Administered 2014-11-02: 1 [drp] via OPHTHALMIC

## 2014-11-02 MED ORDER — MIDAZOLAM HCL 2 MG/2ML IJ SOLN
INTRAMUSCULAR | Status: DC | PRN
Start: 2014-11-02 — End: 2014-11-02
  Administered 2014-11-02: 1 mg via INTRAVENOUS

## 2014-11-02 MED ORDER — CEFUROXIME OPHTHALMIC INJECTION 1 MG/0.1 ML
INJECTION | OPHTHALMIC | Status: DC | PRN
  Administered 2014-11-02: 0.1 mL via INTRACAMERAL

## 2014-11-02 SURGICAL SUPPLY — 21 items
CANNULA ANT/CHMB 27GA (MISCELLANEOUS) ×3 IMPLANT
CUP MEDICINE 2OZ PLAST GRAD ST (MISCELLANEOUS) ×3 IMPLANT
GLOVE BIO SURGEON STRL SZ8 (GLOVE) ×3 IMPLANT
GLOVE BIOGEL M 6.5 STRL (GLOVE) ×3 IMPLANT
GLOVE SURG LX 8.0 MICRO (GLOVE) ×2
GLOVE SURG LX STRL 8.0 MICRO (GLOVE) ×1 IMPLANT
GOWN STRL REUS W/ TWL LRG LVL3 (GOWN DISPOSABLE) ×2 IMPLANT
GOWN STRL REUS W/TWL LRG LVL3 (GOWN DISPOSABLE) ×4
LENS IOL TECNIS 29.5 (Intraocular Lens) ×3 IMPLANT
PACK CATARACT (MISCELLANEOUS) ×3 IMPLANT
PACK CATARACT BRASINGTON LX (MISCELLANEOUS) ×3 IMPLANT
PACK EYE AFTER SURG (MISCELLANEOUS) ×3 IMPLANT
SOL BSS BAG (MISCELLANEOUS) ×3
SOL PREP PVP 2OZ (MISCELLANEOUS) ×3
SOLUTION BSS BAG (MISCELLANEOUS) ×1 IMPLANT
SOLUTION PREP PVP 2OZ (MISCELLANEOUS) ×1 IMPLANT
SYR 3ML LL SCALE MARK (SYRINGE) ×3 IMPLANT
SYR 5ML LL (SYRINGE) ×3 IMPLANT
SYR TB 1ML 27GX1/2 LL (SYRINGE) ×3 IMPLANT
WATER STERILE IRR 1000ML POUR (IV SOLUTION) ×3 IMPLANT
WIPE NON LINTING 3.25X3.25 (MISCELLANEOUS) ×3 IMPLANT

## 2014-11-02 NOTE — Anesthesia Procedure Notes (Signed)
Procedure Name: MAC Date/Time: 11/02/2014 12:52 PM Performed by: Nelda Marseille Pre-anesthesia Checklist: Patient identified, Emergency Drugs available, Suction available, Patient being monitored and Timeout performed

## 2014-11-02 NOTE — Anesthesia Preprocedure Evaluation (Signed)
Anesthesia Evaluation  Patient identified by MRN, date of birth, ID band Patient awake    Reviewed: Allergy & Precautions, H&P , NPO status , Patient's Chart, lab work & pertinent test results, reviewed documented beta blocker date and time   History of Anesthesia Complications (+) PONV and history of anesthetic complications  Airway Mallampati: II  TM Distance: >3 FB Neck ROM: full    Dental no notable dental hx. (+) Teeth Intact   Pulmonary neg pulmonary ROS,  breath sounds clear to auscultation  Pulmonary exam normal       Cardiovascular Exercise Tolerance: Good hypertension, negative cardio ROS  Rhythm:regular Rate:Normal     Neuro/Psych negative neurological ROS  negative psych ROS   GI/Hepatic negative GI ROS, Neg liver ROS,   Endo/Other  negative endocrine ROSdiabetes  Renal/GU      Musculoskeletal   Abdominal   Peds  Hematology negative hematology ROS (+)   Anesthesia Other Findings   Reproductive/Obstetrics negative OB ROS                             Anesthesia Physical  Anesthesia Plan  ASA: II  Anesthesia Plan: MAC   Post-op Pain Management:    Induction:   Airway Management Planned:   Additional Equipment:   Intra-op Plan:   Post-operative Plan:   Informed Consent: I have reviewed the patients History and Physical, chart, labs and discussed the procedure including the risks, benefits and alternatives for the proposed anesthesia with the patient or authorized representative who has indicated his/her understanding and acceptance.     Plan Discussed with: CRNA  Anesthesia Plan Comments:         Anesthesia Quick Evaluation

## 2014-11-02 NOTE — Discharge Instructions (Signed)
Eye Surgery Discharge Instructions  Expect mild scratchy sensation or mild soreness. DO NOT RUB YOUR EYE!  The day of surgery:  Minimal physical activity, but bed rest is not required  No reading, computer work, or close hand work  No bending, lifting, or straining.  May watch TV  For 24 hours:  No driving, legal decisions, or alcoholic beverages  Safety precautions  Eat anything you prefer: It is better to start with liquids, then soup then solid foods.  _____ Eye patch should be worn until postoperative exam tomorrow.  ____ Solar shield eyeglasses should be worn for comfort in the sunlight/patch while sleeping  Resume all regular medications including aspirin or Coumadin if these were discontinued prior to surgery. You may shower, bathe, shave, or wash your hair. Tylenol may be taken for mild discomfort.  Call your doctor if you experience significant pain, nausea, or vomiting, fever > 101 or other signs of infection. [phone] or [phone] Specific instructions:  Follow-up Information    Follow up with Tim Lair, MD On 11/03/2014.   Specialty:  Ophthalmology   Why:  10:40   Contact information:   [address] [place] [address] [phone]

## 2014-11-02 NOTE — Op Note (Signed)
PREOPERATIVE DIAGNOSIS:  Nuclear sclerotic cataract of the left eye.   POSTOPERATIVE DIAGNOSIS:  left nuclear sclerotic cataract   OPERATIVE PROCEDURE:  Procedure(s): CATARACT EXTRACTION PHACO AND INTRAOCULAR LENS PLACEMENT (IOC)   SURGEON:  Birder Robson, MD.   ANESTHESIA:   No anesthesia staff entered.  1.      Managed anesthesia care. 2.      Topical tetracaine drops followed by 2% Xylocaine jelly applied in the preoperative holding area.   COMPLICATIONS:  None.   TECHNIQUE:   Stop and chop   DESCRIPTION OF PROCEDURE:  The patient was examined and consented in the preoperative holding area where the aforementioned topical anesthesia was applied to the left eye and then brought back to the Operating Room where the left eye was prepped and draped in the usual sterile ophthalmic fashion and a lid speculum was placed. A paracentesis was created with the side port blade and the anterior chamber was filled with viscoelastic. A near clear corneal incision was performed with the steel keratome. A continuous curvilinear capsulorrhexis was performed with a cystotome followed by the capsulorrhexis forceps. Hydrodissection and hydrodelineation were carried out with BSS on a blunt cannula. The lens was removed in a stop and chop  technique and the remaining cortical material was removed with the irrigation-aspiration handpiece. The capsular bag was inflated with viscoelastic and the Technis [id number] lens was placed in the capsular bag without complication. The remaining viscoelastic was removed from the eye with the irrigation-aspiration handpiece. The wounds were hydrated. The anterior chamber was flushed with Miostat and the eye was inflated to physiologic pressure. 0.1 mL of cefuroxime concentration 10 mg/mL was placed in the anterior chamber. The wounds were found to be water tight. The eye was dressed with Vigamox. The patient was given protective glasses to wear throughout the day and a shield with  which to sleep tonight. The patient was also given drops with which to begin a drop regimen today and will follow-up with me in one day.  Implant Name Type Inv. Item Serial No. Manufacturer Lot No. LRB No. Used  LENS IOL TECNIS  29.5 - [id number] Intraocular Lens LENS IOL TECNIS  29.5 [id number] AMO   Left 1   Procedure(s) with comments: CATARACT EXTRACTION PHACO AND INTRAOCULAR LENS PLACEMENT (IOC) (Left) - [place]: 00:34.2 AP%: 22.5 CDE: 7.71  Fluid lot# [id number]  Electronically signed: [place] 11/02/2014 1:06 PM

## 2014-11-02 NOTE — H&P (Signed)
  All labs reviewed. Abnormal studies sent to patients PCP when indicated.  Previous H&P reviewed, patient examined, there are NO CHANGES.  Shelly Aina LOUIS8/9/201612:42 PM

## 2014-11-02 NOTE — Transfer of Care (Signed)
Immediate Anesthesia Transfer of Care Note  Patient: Shelly Armstrong  Procedure(s) Performed: Procedure(s) with comments: CATARACT EXTRACTION PHACO AND INTRAOCULAR LENS PLACEMENT (IOC) (Left) - [place]: 00:34.2 AP%: 22.5 CDE: 7.71  Fluid lot# [id number]  Patient Location: PACU  Anesthesia Type:MAC  Level of Consciousness: awake, alert  and oriented  Airway & Oxygen Therapy: Patient Spontanous Breathing  Post-op Assessment: Report given to RN and Post -op Vital signs reviewed and stable  Post vital signs: Reviewed and stable  Last Vitals:  Filed Vitals:   11/02/14 1212  BP: 147/77  Pulse: 55  Temp: 36.8 C  Resp: 16    Complications: No apparent anesthesia complications

## 2014-11-02 NOTE — Anesthesia Postprocedure Evaluation (Signed)
  Anesthesia Post-op Note  Patient: Shelly Armstrong  Procedure(s) Performed: Procedure(s) with comments: CATARACT EXTRACTION PHACO AND INTRAOCULAR LENS PLACEMENT (IOC) (Left) - [place]: 00:34.2 AP%: 22.5 CDE: 7.71  Fluid lot# [id number]  Anesthesia type:MAC  Patient location: PACU  Post pain: Pain level controlled  Post assessment: Post-op Vital signs reviewed, Patient's Cardiovascular Status Stable, Respiratory Function Stable, Patent Airway and No signs of Nausea or vomiting  Post vital signs: Reviewed and stable  Last Vitals:  Filed Vitals:   11/02/14 1212  BP: 147/77  Pulse: 55  Temp: 36.8 C  Resp: 16    Level of consciousness: awake, alert  and patient cooperative  Complications: No apparent anesthesia complications

## 2014-11-24 ENCOUNTER — Other Ambulatory Visit: Payer: Self-pay | Admitting: Gastroenterology

## 2014-11-24 DIAGNOSIS — R1013 Epigastric pain: Secondary | ICD-10-CM

## 2014-12-01 ENCOUNTER — Ambulatory Visit
Admission: RE | Admit: 2014-12-01 | Discharge: 2014-12-01 | Disposition: A | Discharge status: Home / Self Care | Payer: PPO | Source: Ambulatory Visit | Attending: Gastroenterology | Admitting: Gastroenterology

## 2014-12-01 DIAGNOSIS — R1013 Epigastric pain: Secondary | ICD-10-CM | POA: Diagnosis present

## 2014-12-01 DIAGNOSIS — Z9049 Acquired absence of other specified parts of digestive tract: Secondary | ICD-10-CM | POA: Diagnosis not present

## 2015-03-08 ENCOUNTER — Other Ambulatory Visit: Payer: Self-pay | Admitting: Internal Medicine

## 2015-03-08 DIAGNOSIS — Z1231 Encounter for screening mammogram for malignant neoplasm of breast: Secondary | ICD-10-CM

## 2015-03-17 ENCOUNTER — Ambulatory Visit: Payer: PPO

## 2015-03-24 ENCOUNTER — Ambulatory Visit
Admission: RE | Admit: 2015-03-24 | Discharge: 2015-03-24 | Disposition: A | Discharge status: Home / Self Care | Payer: PPO | Source: Ambulatory Visit | Attending: Internal Medicine | Admitting: Internal Medicine

## 2015-03-24 DIAGNOSIS — Z1231 Encounter for screening mammogram for malignant neoplasm of breast: Secondary | ICD-10-CM | POA: Insufficient documentation

## 2015-04-07 DIAGNOSIS — E782 Mixed hyperlipidemia: Secondary | ICD-10-CM | POA: Diagnosis not present

## 2015-04-14 DIAGNOSIS — R601 Generalized edema: Secondary | ICD-10-CM | POA: Diagnosis not present

## 2015-04-14 DIAGNOSIS — Z0000 Encounter for general adult medical examination without abnormal findings: Secondary | ICD-10-CM | POA: Diagnosis not present

## 2015-04-14 DIAGNOSIS — I10 Essential (primary) hypertension: Secondary | ICD-10-CM | POA: Diagnosis not present

## 2015-04-14 DIAGNOSIS — E782 Mixed hyperlipidemia: Secondary | ICD-10-CM | POA: Diagnosis not present

## 2015-04-14 DIAGNOSIS — R739 Hyperglycemia, unspecified: Secondary | ICD-10-CM | POA: Diagnosis not present

## 2015-06-02 DIAGNOSIS — Z961 Presence of intraocular lens: Secondary | ICD-10-CM | POA: Diagnosis not present

## 2015-07-21 DIAGNOSIS — M65332 Trigger finger, left middle finger: Secondary | ICD-10-CM | POA: Diagnosis not present

## 2015-10-06 DIAGNOSIS — E782 Mixed hyperlipidemia: Secondary | ICD-10-CM | POA: Diagnosis not present

## 2015-10-06 DIAGNOSIS — I10 Essential (primary) hypertension: Secondary | ICD-10-CM | POA: Diagnosis not present

## 2015-10-06 DIAGNOSIS — R739 Hyperglycemia, unspecified: Secondary | ICD-10-CM | POA: Diagnosis not present

## 2015-10-13 DIAGNOSIS — R601 Generalized edema: Secondary | ICD-10-CM | POA: Diagnosis not present

## 2015-10-13 DIAGNOSIS — R739 Hyperglycemia, unspecified: Secondary | ICD-10-CM | POA: Diagnosis not present

## 2015-10-13 DIAGNOSIS — I10 Essential (primary) hypertension: Secondary | ICD-10-CM | POA: Diagnosis not present

## 2015-10-13 DIAGNOSIS — E782 Mixed hyperlipidemia: Secondary | ICD-10-CM | POA: Diagnosis not present

## 2015-11-04 DIAGNOSIS — S5011XA Contusion of right forearm, initial encounter: Secondary | ICD-10-CM | POA: Diagnosis not present

## 2016-02-09 DIAGNOSIS — K5909 Other constipation: Secondary | ICD-10-CM | POA: Diagnosis not present

## 2016-02-09 DIAGNOSIS — K210 Gastro-esophageal reflux disease with esophagitis: Secondary | ICD-10-CM | POA: Diagnosis not present

## 2016-02-22 ENCOUNTER — Other Ambulatory Visit: Payer: Self-pay | Admitting: Internal Medicine

## 2016-02-22 DIAGNOSIS — Z1231 Encounter for screening mammogram for malignant neoplasm of breast: Secondary | ICD-10-CM

## 2016-04-05 ENCOUNTER — Ambulatory Visit
Admission: RE | Admit: 2016-04-05 | Discharge: 2016-04-05 | Disposition: A | Discharge status: Home / Self Care | Payer: PPO | Source: Ambulatory Visit | Attending: Internal Medicine | Admitting: Internal Medicine

## 2016-04-05 DIAGNOSIS — Z1231 Encounter for screening mammogram for malignant neoplasm of breast: Secondary | ICD-10-CM | POA: Insufficient documentation

## 2016-04-19 DIAGNOSIS — Z0000 Encounter for general adult medical examination without abnormal findings: Secondary | ICD-10-CM | POA: Diagnosis not present

## 2016-04-19 DIAGNOSIS — I10 Essential (primary) hypertension: Secondary | ICD-10-CM | POA: Diagnosis not present

## 2016-04-19 DIAGNOSIS — R739 Hyperglycemia, unspecified: Secondary | ICD-10-CM | POA: Diagnosis not present

## 2016-04-19 DIAGNOSIS — E782 Mixed hyperlipidemia: Secondary | ICD-10-CM | POA: Diagnosis not present

## 2016-04-19 DIAGNOSIS — R3915 Urgency of urination: Secondary | ICD-10-CM | POA: Diagnosis not present

## 2016-07-26 DIAGNOSIS — H35372 Puckering of macula, left eye: Secondary | ICD-10-CM | POA: Diagnosis not present

## 2016-08-23 DIAGNOSIS — M79671 Pain in right foot: Secondary | ICD-10-CM | POA: Diagnosis not present

## 2016-08-23 DIAGNOSIS — B070 Plantar wart: Secondary | ICD-10-CM | POA: Diagnosis not present

## 2016-09-11 DIAGNOSIS — M79671 Pain in right foot: Secondary | ICD-10-CM | POA: Diagnosis not present

## 2016-09-11 DIAGNOSIS — B070 Plantar wart: Secondary | ICD-10-CM | POA: Diagnosis not present

## 2016-10-11 DIAGNOSIS — E782 Mixed hyperlipidemia: Secondary | ICD-10-CM | POA: Diagnosis not present

## 2016-10-11 DIAGNOSIS — Z0000 Encounter for general adult medical examination without abnormal findings: Secondary | ICD-10-CM | POA: Diagnosis not present

## 2016-10-11 DIAGNOSIS — R739 Hyperglycemia, unspecified: Secondary | ICD-10-CM | POA: Diagnosis not present

## 2016-10-11 DIAGNOSIS — I10 Essential (primary) hypertension: Secondary | ICD-10-CM | POA: Diagnosis not present

## 2016-10-18 DIAGNOSIS — I10 Essential (primary) hypertension: Secondary | ICD-10-CM | POA: Diagnosis not present

## 2016-10-18 DIAGNOSIS — R739 Hyperglycemia, unspecified: Secondary | ICD-10-CM | POA: Diagnosis not present

## 2016-10-18 DIAGNOSIS — R601 Generalized edema: Secondary | ICD-10-CM | POA: Diagnosis not present

## 2016-10-18 DIAGNOSIS — E782 Mixed hyperlipidemia: Secondary | ICD-10-CM | POA: Diagnosis not present

## 2016-12-03 ENCOUNTER — Observation Stay
Admission: EM | Admit: 2016-12-03 | Discharge: 2016-12-04 | Disposition: A | Discharge status: Home / Self Care | Payer: PPO | Attending: Internal Medicine | Admitting: Internal Medicine

## 2016-12-03 ENCOUNTER — Emergency Department: Payer: PPO

## 2016-12-03 ENCOUNTER — Encounter: Payer: Self-pay | Admitting: *Deleted

## 2016-12-03 DIAGNOSIS — I10 Essential (primary) hypertension: Secondary | ICD-10-CM | POA: Insufficient documentation

## 2016-12-03 DIAGNOSIS — E785 Hyperlipidemia, unspecified: Secondary | ICD-10-CM | POA: Insufficient documentation

## 2016-12-03 DIAGNOSIS — I25110 Atherosclerotic heart disease of native coronary artery with unstable angina pectoris: Secondary | ICD-10-CM | POA: Diagnosis not present

## 2016-12-03 DIAGNOSIS — I200 Unstable angina: Secondary | ICD-10-CM | POA: Diagnosis present

## 2016-12-03 DIAGNOSIS — Z6830 Body mass index (BMI) 30.0-30.9, adult: Secondary | ICD-10-CM | POA: Insufficient documentation

## 2016-12-03 DIAGNOSIS — M8580 Other specified disorders of bone density and structure, unspecified site: Secondary | ICD-10-CM | POA: Diagnosis not present

## 2016-12-03 DIAGNOSIS — Z79899 Other long term (current) drug therapy: Secondary | ICD-10-CM | POA: Insufficient documentation

## 2016-12-03 DIAGNOSIS — Z7982 Long term (current) use of aspirin: Secondary | ICD-10-CM | POA: Insufficient documentation

## 2016-12-03 DIAGNOSIS — K219 Gastro-esophageal reflux disease without esophagitis: Secondary | ICD-10-CM | POA: Diagnosis not present

## 2016-12-03 DIAGNOSIS — R079 Chest pain, unspecified: Secondary | ICD-10-CM | POA: Diagnosis not present

## 2016-12-03 DIAGNOSIS — E669 Obesity, unspecified: Secondary | ICD-10-CM | POA: Insufficient documentation

## 2016-12-03 DIAGNOSIS — R0602 Shortness of breath: Secondary | ICD-10-CM | POA: Diagnosis not present

## 2016-12-03 DIAGNOSIS — I714 Abdominal aortic aneurysm, without rupture: Secondary | ICD-10-CM | POA: Diagnosis not present

## 2016-12-03 DIAGNOSIS — Z8249 Family history of ischemic heart disease and other diseases of the circulatory system: Secondary | ICD-10-CM | POA: Insufficient documentation

## 2016-12-03 DIAGNOSIS — R0789 Other chest pain: Secondary | ICD-10-CM | POA: Diagnosis not present

## 2016-12-03 DIAGNOSIS — R7989 Other specified abnormal findings of blood chemistry: Secondary | ICD-10-CM | POA: Diagnosis not present

## 2016-12-03 LAB — PROTIME-INR
INR: 1
PROTHROMBIN TIME: 13.1 s (ref 11.4–15.2)

## 2016-12-03 LAB — CBC
HCT: 40.9 % (ref 35.0–47.0)
HEMOGLOBIN: 14.1 g/dL (ref 12.0–16.0)
MCH: 33.2 pg (ref 26.0–34.0)
MCHC: 34.6 g/dL (ref 32.0–36.0)
MCV: 96.1 fL (ref 80.0–100.0)
Platelets: 231 10*3/uL (ref 150–440)
RBC: 4.25 MIL/uL (ref 3.80–5.20)
RDW: 13.3 % (ref 11.5–14.5)
WBC: 8.4 10*3/uL (ref 3.6–11.0)

## 2016-12-03 LAB — BASIC METABOLIC PANEL
ANION GAP: 10 (ref 5–15)
BUN: 22 mg/dL — ABNORMAL HIGH (ref 6–20)
CHLORIDE: 101 mmol/L (ref 101–111)
CO2: 24 mmol/L (ref 22–32)
Calcium: 9.2 mg/dL (ref 8.9–10.3)
Creatinine, Ser: 0.62 mg/dL (ref 0.44–1.00)
GFR calc Af Amer: >60 mL/min (ref >60)
Glucose, Bld: 115 mg/dL — ABNORMAL HIGH (ref 65–99)
POTASSIUM: 4.5 mmol/L (ref 3.5–5.1)
SODIUM: 135 mmol/L (ref 135–145)

## 2016-12-03 LAB — LIPID PANEL
Cholesterol: 156 mg/dL (ref 0–200)
HDL: 56 mg/dL (ref >40)
LDL Cholesterol: 88 mg/dL (ref 0–99)
Total CHOL/HDL Ratio: 2.8 RATIO
Triglycerides: 60 mg/dL (ref <150)
VLDL: 12 mg/dL (ref 0–40)

## 2016-12-03 LAB — TROPONIN I
TROPONIN I: 0.11 ng/mL — AB (ref <0.03)
Troponin I: <0.03 ng/mL (ref <0.03)
Troponin I: 0.15 ng/mL (ref <0.03)

## 2016-12-03 LAB — APTT: APTT: 35 s (ref 24–36)

## 2016-12-03 MED ORDER — LOSARTAN POTASSIUM-HCTZ 100-25 MG PO TABS: 1.0000 | ORAL_TABLET | Freq: Every day | ORAL | Status: DC

## 2016-12-03 MED ORDER — ADULT MULTIVITAMIN W/MINERALS CH: 1.0000 | ORAL_TABLET | Freq: Every day | ORAL | Status: DC

## 2016-12-03 MED ORDER — HYDROCHLOROTHIAZIDE 25 MG PO TABS
25.0000 mg | ORAL_TABLET | Freq: Every day | ORAL | Status: DC
  Administered 2016-12-04: 25 mg via ORAL
  Filled 2016-12-03: qty 1

## 2016-12-03 MED ORDER — HEPARIN (PORCINE) IN NACL 100-0.45 UNIT/ML-% IJ SOLN
1000.0000 [IU]/h | INTRAMUSCULAR | Status: DC
  Administered 2016-12-04: 1000 [IU]/h via INTRAVENOUS
  Filled 2016-12-03: qty 250

## 2016-12-03 MED ORDER — IOPAMIDOL (ISOVUE-370) INJECTION 76%
100.0000 mL | Freq: Once | INTRAVENOUS | Status: AC | PRN
  Administered 2016-12-03: 100 mL via INTRAVENOUS

## 2016-12-03 MED ORDER — PANTOPRAZOLE SODIUM 40 MG PO TBEC
40.0000 mg | DELAYED_RELEASE_TABLET | Freq: Every day | ORAL | Status: DC
  Administered 2016-12-03: 40 mg via ORAL
  Filled 2016-12-03: qty 1

## 2016-12-03 MED ORDER — ASPIRIN EC 81 MG PO TBEC
81.0000 mg | DELAYED_RELEASE_TABLET | Freq: Every evening | ORAL | Status: DC
Start: 2016-12-03 — End: 2016-12-04
  Administered 2016-12-03: 81 mg via ORAL
  Filled 2016-12-03: qty 1

## 2016-12-03 MED ORDER — NITROGLYCERIN 0.4 MG SL SUBL: 0.4000 mg | SUBLINGUAL_TABLET | SUBLINGUAL | Status: DC | PRN

## 2016-12-03 MED ORDER — ENOXAPARIN SODIUM 80 MG/0.8ML ~~LOC~~ SOLN
1.0000 mg/kg | Freq: Once | SUBCUTANEOUS | Status: AC
  Administered 2016-12-03: 75 mg via SUBCUTANEOUS
  Filled 2016-12-03: qty 0.8

## 2016-12-03 MED ORDER — NITROGLYCERIN 0.4 MG SL SUBL
0.4000 mg | SUBLINGUAL_TABLET | Freq: Once | SUBLINGUAL | Status: AC
  Administered 2016-12-03: 0.4 mg via SUBLINGUAL
  Filled 2016-12-03: qty 1

## 2016-12-03 MED ORDER — ACETAMINOPHEN 325 MG PO TABS: 650.0000 mg | ORAL_TABLET | Freq: Four times a day (QID) | ORAL | Status: DC | PRN

## 2016-12-03 MED ORDER — ONDANSETRON HCL 4 MG PO TABS: 4.0000 mg | ORAL_TABLET | Freq: Four times a day (QID) | ORAL | Status: DC | PRN

## 2016-12-03 MED ORDER — ACETAMINOPHEN 650 MG RE SUPP: 650.0000 mg | Freq: Four times a day (QID) | RECTAL | Status: DC | PRN

## 2016-12-03 MED ORDER — ENOXAPARIN SODIUM 40 MG/0.4ML ~~LOC~~ SOLN: 40.0000 mg | SUBCUTANEOUS | Status: DC

## 2016-12-03 MED ORDER — LOSARTAN POTASSIUM 50 MG PO TABS
100.0000 mg | ORAL_TABLET | Freq: Every day | ORAL | Status: DC
  Administered 2016-12-04: 100 mg via ORAL
  Filled 2016-12-03: qty 2

## 2016-12-03 MED ORDER — NITROGLYCERIN 2 % TD OINT: 1.0000 [in_us] | TOPICAL_OINTMENT | Freq: Once | TRANSDERMAL | Status: DC

## 2016-12-03 MED ORDER — ATORVASTATIN CALCIUM 20 MG PO TABS
40.0000 mg | ORAL_TABLET | Freq: Every evening | ORAL | Status: DC
  Administered 2016-12-03: 40 mg via ORAL
  Filled 2016-12-03: qty 2

## 2016-12-03 MED ORDER — SODIUM CHLORIDE 0.9 % IV SOLN
Freq: Once | INTRAVENOUS | Status: AC
  Administered 2016-12-03: 14:00:00 via INTRAVENOUS

## 2016-12-03 MED ORDER — ONDANSETRON HCL 4 MG/2ML IJ SOLN
4.0000 mg | Freq: Four times a day (QID) | INTRAMUSCULAR | Status: DC | PRN
Start: 2016-12-03 — End: 2016-12-04

## 2016-12-03 MED ORDER — ASPIRIN 81 MG PO CHEW
243.0000 mg | CHEWABLE_TABLET | Freq: Once | ORAL | Status: AC
  Administered 2016-12-03: 243 mg via ORAL
  Filled 2016-12-03: qty 3

## 2016-12-03 MED ORDER — POLYETHYLENE GLYCOL 3350 17 G PO PACK: 17.0000 g | PACK | Freq: Every day | ORAL | Status: DC | PRN

## 2016-12-03 NOTE — ED Notes (Signed)
Patient transported to CT 

## 2016-12-03 NOTE — ED Triage Notes (Signed)
Pt states mid chest tightness that began this AM while at work, states the pain goes to her back with cold sweats and SOB, states some left neck tightness

## 2016-12-03 NOTE — Progress Notes (Signed)
ANTICOAGULATION CONSULT NOTE - Initial Consult  Pharmacy Consult for Heparin  Indication: chest pain/ACS  Allergies  Allergen Reactions  . Septra [Sulfamethoxazole-Trimethoprim] Rash    Patient Measurements: Height: 5\' 3"  (160 cm) Weight: 170 lb (77.1 kg) IBW/kg (Calculated) : 52.4 Heparin Dosing Weight: 69 kg   Vital Signs: Temp: 98.1 F (36.7 C) (09/10 1058) Temp Source: Oral (09/10 1058) BP: 108/66 (09/10 1518) Pulse Rate: 93 (09/10 1518)  Labs:  Recent Labs  12/03/16 1059 12/03/16 1650  HGB 14.1  --   HCT 40.9  --   PLT 231  --   CREATININE 0.62  --   TROPONINI <0.03 0.11*    Estimated Creatinine Clearance: 63.4 mL/min (by C-G formula based on SCr of 0.62 mg/dL).   Medical History: Past Medical History:  Diagnosis Date  . Chickenpox   . Edema    feet/legs  . Hyperglycemia   . Hyperlipidemia   . Hypertension   . Osteopenia   . PONV (postoperative nausea and vomiting)    after hand surgery    Medications:  Prescriptions Prior to Admission  Medication Sig Dispense Refill Last Dose  . aspirin 81 MG tablet Take 81 mg by mouth daily.   12/02/2016 at 2100  . atorvastatin (LIPITOR) 40 MG tablet Take 40 mg by mouth daily.   12/02/2016 at 1800  . Calcium-Vitamin D (CALTRATE 600 PLUS-VIT D PO) Take 1 tablet by mouth 2 (two) times daily.   12/03/2016 at 0800  . losartan-hydrochlorothiazide (HYZAAR) 100-25 MG per tablet Take 1 tablet by mouth daily.   12/03/2016 at 0800  . Multiple Vitamins-Minerals (CENTRUM SILVER PO) Take 1 tablet by mouth daily.   12/02/2016 at 0800  . omeprazole (PRILOSEC) 40 MG capsule Take 40 mg by mouth daily.   12/02/2016 at 0700  . Polyethylene Glycol 3350-GRX POWD Take 17 g by mouth daily as needed.   PRN at PRN  . acetaminophen (TYLENOL) 500 MG tablet Take 1,000 mg by mouth every 6 (six) hours as needed for moderate pain.   prn at prn    Assessment: Pharmacy consulted to dose heparin in this 71 year old female with ACS/NSTEMI.  CrCl = 63.4  ml/min Pt received lovenox 75 mg SQ X 1 on 9/10 @ 14:00.  Goal of Therapy:  Heparin level 0.3-0.7 units/ml Monitor platelets by anticoagulation protocol: Yes   Plan:  Will draw baseline INR and aPTT.  Will begin Heparin drip @ 1000 units/hr on 9/11 @0200 . Will draw heparin level on 9/11 @ 0800.  Will check CBC daily.   Lamere Lightner D 12/03/2016,6:17 PM

## 2016-12-03 NOTE — Progress Notes (Signed)
Patient presenting with chest pain- EKG with RBBB First troponin negative, second one elevated at 0.11 Will start heparin drip, paged Dr. Saralyn Pilar- awaiting call back If remains elevated, might need cardiac cath Keep her NPO after midnight

## 2016-12-03 NOTE — H&P (Signed)
Shelly Armstrong at [facility] NAME: Shelly Armstrong    MR#:  [id number]  DATE OF BIRTH:  07-Apr-1945  DATE OF ADMISSION:  12/03/2016  PRIMARY CARE PHYSICIAN: Kirk Ruths, MD   REQUESTING/REFERRING PHYSICIAN: Dr. Lenise Arena  CHIEF COMPLAINT:   Chief Complaint  Patient presents with  . Chest Pain    HISTORY OF PRESENT ILLNESS:  Shelly Armstrong  is a 71 y.o. female with a known history of hypertension, hyperlipidemia, osteopenia presents to hospital secondary to sudden onset of chest tightness that started this morning. Patient says that she's never had prior chest tightness or chest pain symptoms. No prior cardiac history. She woke up fine this morning, ate her breakfast went to work, 30 minutes later started feeling diaphoretic, sweat dripping down her forehead and she felt extensive tightness in her chest. She also had dyspneic symptoms which she feels secondary to anxiety from her chest tightness. Her symptoms remained until she presented to the ER and got aspirin and nitroglycerin. Right now she is symptom free. First troponin is negative. EKG with right bundle branch block. She is being admitted for unstable angina.  PAST MEDICAL HISTORY:   Past Medical History:  Diagnosis Date  . Chickenpox   . Edema    feet/legs  . Hyperglycemia   . Hyperlipidemia   . Hypertension   . Osteopenia   . PONV (postoperative nausea and vomiting)    after hand surgery    PAST SURGICAL HISTORY:   Past Surgical History:  Procedure Laterality Date  . ABDOMINAL HYSTERECTOMY    . BREAST BIOPSY Left 2001   stereotactic biopsy - negative  . BREAST SURGERY     Breast excisional biopsy  . CATARACT EXTRACTION W/PHACO Right 10/19/2014   Procedure: CATARACT EXTRACTION PHACO AND INTRAOCULAR LENS PLACEMENT (IOC);  Surgeon: Birder Robson, MD;  Location: [facility] ORS;  Service: Ophthalmology;  Laterality: Right;  casette lot# [id number]   [place]  00:29 AP17.7  CDE 5.09  . CATARACT EXTRACTION W/PHACO Left 11/02/2014   Procedure: CATARACT EXTRACTION PHACO AND INTRAOCULAR LENS PLACEMENT (IOC);  Surgeon: Birder Robson, MD;  Location: [facility] ORS;  Service: Ophthalmology;  Laterality: Left;  [place]: 00:34.2 AP%: 22.5 CDE: 7.71  Fluid lot# [id number]  . CHOLECYSTECTOMY    . COLONOSCOPY WITH PROPOFOL N/A 08/31/2014   Procedure: COLONOSCOPY WITH PROPOFOL;  Surgeon: Lollie Sails, MD;  Location: [facility] ENDOSCOPY;  Service: Endoscopy;  Laterality: N/A;  . ctr    . ESOPHAGOGASTRODUODENOSCOPY N/A 08/31/2014   Procedure: ESOPHAGOGASTRODUODENOSCOPY (EGD);  Surgeon: Lollie Sails, MD;  Location: [facility] ENDOSCOPY;  Service: Endoscopy;  Laterality: N/A;  . FRACTURE SURGERY     Jaw fx repair; Hand procedure after trauma  . MANDIBLE FRACTURE SURGERY    . WRIST SURGERY     CTR    SOCIAL HISTORY:   Social History  Substance Use Topics  . Smoking status: Never Smoker  . Smokeless tobacco: Never Used  . Alcohol use No    FAMILY HISTORY:   Family History  Problem Relation Age of Onset  . Heart failure Mother   . CAD Father   . Breast cancer Neg Hx     DRUG ALLERGIES:   Allergies  Allergen Reactions  . Septra [Sulfamethoxazole-Trimethoprim] Rash    REVIEW OF SYSTEMS:   Review of Systems  Constitutional: Positive for diaphoresis. Negative for chills, fever, malaise/fatigue and weight loss.  HENT: Negative for ear discharge, ear pain, hearing loss, nosebleeds and tinnitus.  Eyes: Negative for blurred vision, double vision and photophobia.  Respiratory: Negative for cough, hemoptysis, shortness of breath and wheezing.   Cardiovascular: Positive for chest pain. Negative for palpitations, orthopnea and leg swelling.  Gastrointestinal: Positive for nausea. Negative for abdominal pain, constipation, diarrhea, heartburn, melena and vomiting.  Genitourinary: Negative for dysuria, frequency, hematuria and urgency.  Musculoskeletal: Negative for back pain,  myalgias and neck pain.  Skin: Negative for rash.  Neurological: Negative for dizziness, tingling, tremors, sensory change, speech change, focal weakness and headaches.  Endo/Heme/Allergies: Does not bruise/bleed easily.  Psychiatric/Behavioral: Negative for depression.    MEDICATIONS AT HOME:   Prior to Admission medications   Medication Sig Start Date End Date Taking? Authorizing Provider  aspirin 81 MG tablet Take 81 mg by mouth daily.   Yes [provider]  atorvastatin (LIPITOR) 40 MG tablet Take 40 mg by mouth daily.   Yes [provider]  Calcium-Vitamin D (CALTRATE 600 PLUS-VIT D PO) Take 1 tablet by mouth 2 (two) times daily.   Yes [provider]  losartan-hydrochlorothiazide (HYZAAR) 100-25 MG per tablet Take 1 tablet by mouth daily.   Yes [provider]  Multiple Vitamins-Minerals (CENTRUM SILVER PO) Take 1 tablet by mouth daily.   Yes [provider]  omeprazole (PRILOSEC) 40 MG capsule Take 40 mg by mouth daily.   Yes [provider]  Polyethylene Glycol 3350-GRX POWD Take 17 g by mouth daily as needed.   Yes [provider]  acetaminophen (TYLENOL) 500 MG tablet Take 1,000 mg by mouth every 6 (six) hours as needed for moderate pain.    [provider]      VITAL SIGNS:  Blood pressure 114/70, pulse 92, temperature 98.1 F (36.7 C), temperature source Oral, resp. rate 17, height 5\' 3"  (1.6 m), weight 77.1 kg (170 lb), SpO2 100 %.  PHYSICAL EXAMINATION:   Physical Exam  GENERAL:  71 y.o.-year-old patient lying in the bed with no acute distress.  EYES: Pupils equal, round, reactive to light and accommodation. No scleral icterus. Extraocular muscles intact.  HEENT: Head atraumatic, normocephalic. Oropharynx and nasopharynx clear.  NECK:  Supple, no jugular venous distention. No thyroid enlargement, no tenderness.  LUNGS: Normal breath sounds bilaterally, no wheezing, rales,rhonchi or crepitation. No  use of accessory muscles of respiration.  CARDIOVASCULAR: S1, S2 normal. No rubs, or gallops. 2/6 systolic murmur present ABDOMEN: Soft, nontender, nondistended. Bowel sounds present. No organomegaly or mass.  EXTREMITIES: No pedal edema, cyanosis, or clubbing.  NEUROLOGIC: Cranial nerves II through XII are intact. Muscle strength 5/5 in all extremities. Sensation intact. Gait not checked.  PSYCHIATRIC: The patient is alert and oriented x 3.  SKIN: No obvious rash, lesion, or ulcer.   LABORATORY PANEL:   CBC  Recent Labs Lab 12/03/16 1059  WBC 8.4  HGB 14.1  HCT 40.9  PLT 231   ------------------------------------------------------------------------------------------------------------------  Chemistries   Recent Labs Lab 12/03/16 1059  NA 135  K 4.5  CL 101  CO2 24  GLUCOSE 115*  BUN 22*  CREATININE 0.62  CALCIUM 9.2   ------------------------------------------------------------------------------------------------------------------  Cardiac Enzymes  Recent Labs Lab 12/03/16 1059  TROPONINI <0.03   ------------------------------------------------------------------------------------------------------------------  RADIOLOGY:  Dg Chest 2 View  Result Date: 12/03/2016 CLINICAL DATA:  Shortness of breath and chest tightness.  Nausea. EXAM: CHEST  2 VIEW COMPARISON:  None. FINDINGS: There is mild scarring in the upper lobes. There is no edema or consolidation. The heart size and pulmonary vascularity are normal. No adenopathy.  There is degenerative change in the thoracic spine. There is thoracolumbar levoscoliosis. IMPRESSION: Mild upper lobe scarring bilaterally.  No edema or consolidation. Electronically Signed   By: Lowella Grip III M.D.   On: 12/03/2016 11:52   Ct Angio Chest/abd/pel For Dissection W And/or Wo Contrast  Result Date: 12/03/2016 CLINICAL DATA:  Chest and back pain, acute. Aortic dissection is suspected. EXAM: CT ANGIOGRAPHY CHEST, ABDOMEN AND  PELVIS TECHNIQUE: Multidetector CT imaging through the chest, abdomen and pelvis was performed using the standard protocol during bolus administration of intravenous contrast. Multiplanar reconstructed images and MIPs were obtained and reviewed to evaluate the vascular anatomy. CONTRAST:  100 cc Isovue 370 intravenous COMPARISON:  None. FINDINGS: CTA CHEST FINDINGS Cardiovascular: Noncontrast phase shows no evidence of intramural hematoma. Preferential opacification of the thoracic aorta. No evidence of thoracic aortic aneurysm or dissection. Normal heart size. No pericardial effusion. There is atherosclerotic calcification of the aorta and coronaries. No evidence of pulmonary embolism. Mediastinum/Nodes: Negative for adenopathy or mass. Lungs/Pleura: There is no edema, consolidation, effusion, or pneumothorax. Musculoskeletal: No acute or aggressive finding. Thoracic disc degeneration spurring. Review of the MIP images confirms the above findings. CTA ABDOMEN AND PELVIS FINDINGS VASCULAR Aorta: Negative for aneurysm or dissection. Mild atheromatous changes. Celiac: Atherosclerosis at the ostium without stenosis or beading. Tortuosity of the splenic artery and to a lesser extent the common hepatic artery. SMA: Smooth and widely patent vessels. Renals: Atheromatous plaque at the renal artery origins without stenosis. Negative for beading or aneurysm. There is a small accessory right inferior pole extensor renal artery. IMA: Patent Inflow: Tortuosity without stenosis or dissection. No aneurysm or atheromatous changes. Veins: Unremarkable in the arterial phase. Review of the MIP images confirms the above findings. NON-VASCULAR Hepatobiliary: No focal liver abnormality.Cholecystectomy which may account for the dilated common bile duct which measures 11 mm in diameter, mildly progressed since 2016. No evidence of choledocholithiasis. Pancreas: Unremarkable. Spleen: Unremarkable. Adrenals/Urinary Tract: Negative adrenals.  No hydronephrosis or stone. Unremarkable bladder. Stomach/Bowel: No obstruction. Moderate colonic diverticulosis, most heavily affecting the distal colon. Prominent mucosal density of the stomach, but generalized and without masslike feature. Lymphatic:   No mass or adenopathy. Reproductive:Hysterectomy.  Negative adnexae. Other: No ascites or pneumoperitoneum. Musculoskeletal: Degenerative changes without acute or aggressive finding. Review of the MIP images confirms the above findings. IMPRESSION: 1. Negative for aortic dissection or aneurysm.  No acute finding. 2.  Aortic Atherosclerosis (ICD10-I70.0).  Coronary atherosclerosis. 3. Dilated common bile duct which could be related to previous cholecystectomy. Please correlate with biliary labs. Electronically Signed   By: Monte Fantasia M.D.   On: 12/03/2016 13:00    EKG:   Orders placed or performed during the hospital encounter of 12/03/16  . EKG 12-Lead  . EKG 12-Lead  . ED EKG within 10 minutes  . ED EKG within 10 minutes    IMPRESSION AND PLAN:   Aileana Hodder  is a 71 y.o. female with a known history of hypertension, hyperlipidemia, osteopenia presents to hospital secondary to sudden onset of chest tightness that started this morning.  #1unstable angina- typical presentation, admitted to telemetry. -Recycle troponins. Echocardiogram. Cardiology consulted -Myoview in a.m. If troponins remain negative. -Continue aspirin, statin. Check lipid panel  #2 hypertension-on losartan, hydrochlorothiazide.  #3 GERD-on PPI  #4 hyperlipidemia- on statin  #5 DVT prophylaxis-Lovenox   All the records are reviewed and case discussed with ED provider. Management plans discussed with the patient, family and they are in agreement.  CODE STATUS: full code  TOTAL TIME TAKING CARE OF THIS PATIENT: 50 minutes.    Gladstone Lighter M.D on 12/03/2016 at 2:14 PM  Between 7am to 6pm - Pager - [phone]  After 6pm go to [email or web address] -  password [email or web address] [facility]  Office  [phone]  CC: Primary care physician; Kirk Ruths, MD

## 2016-12-03 NOTE — Consult Note (Signed)
[facility] Cardiology  CARDIOLOGY CONSULT NOTE  Patient ID: Shelly Armstrong MRN: [id number] DOB/AGE: [birth year] 71 y.o.  Admit date: 12/03/2016 Referring Physician Tressia Miners Primary Physician [facility] Primary Cardiologist None per patient Reason for Consultation Unstable angina  HPI: 71 year old female referred for evaluation of unstable angina. The patient has a history of essential hypertension, hyperlipidemia, and obesity, with no prior cardiac history. 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 The patient had been in her usual state of health until about 30 minutes after starting work when she developed diffuse diaphoresis, substernal chest tightness, nausea, shortness of breath, and lightheadedness, lasting about 30 minutes, which progressively worsened despite resting. The patient drove herself to [facility] ER. The chest pain resolved after aspirin and nitroglycerin. ECG revealed sinus tachycardia at a rate of 127 bpm with right bundle branch block and left anterior fascicular block, not evident on prior ECG from 1999. Admission labs notable for negative initial troponin. Chest CT negative for aortic dissection or aneurysm. Currently, the patient denies significant chest pain or shortness of breath. She states she has intermittent "twinges" of chest discomfort, but nothing like what she experienced earlier today. The patient is typically active, works 40 hours per week. She denies having experienced this type of chest pain before.The patient denies a prior history of MI, stroke, heart failure, diabetes, CKD, valvular heart disease, or prior cardiac catheterization.  Review of systems complete and found to be negative unless listed above     Past Medical History:  Diagnosis Date  . Chickenpox   . Edema    feet/legs  . Hyperglycemia   . Hyperlipidemia   . Hypertension   . Osteopenia   . PONV (postoperative nausea and vomiting)    after  hand surgery    Past Surgical History:  Procedure Laterality Date  . ABDOMINAL HYSTERECTOMY    . BREAST BIOPSY Left 2001   stereotactic biopsy - negative  . BREAST SURGERY     Breast excisional biopsy  . CATARACT EXTRACTION W/PHACO Right 10/19/2014   Procedure: CATARACT EXTRACTION PHACO AND INTRAOCULAR LENS PLACEMENT (IOC);  Surgeon: Birder Robson, MD;  Location: [facility] ORS;  Service: Ophthalmology;  Laterality: Right;  casette lot# [id number]   [place]  00:29 AP17.7 CDE 5.09  . CATARACT EXTRACTION W/PHACO Left 11/02/2014   Procedure: CATARACT EXTRACTION PHACO AND INTRAOCULAR LENS PLACEMENT (IOC);  Surgeon: Birder Robson, MD;  Location: [facility] ORS;  Service: Ophthalmology;  Laterality: Left;  [place]: 00:34.2 AP%: 22.5 CDE: 7.71  Fluid lot# [id number]  . CHOLECYSTECTOMY    . COLONOSCOPY WITH PROPOFOL N/A 08/31/2014   Procedure: COLONOSCOPY WITH PROPOFOL;  Surgeon: Lollie Sails, MD;  Location: [facility] ENDOSCOPY;  Service: Endoscopy;  Laterality: N/A;  . ctr    . ESOPHAGOGASTRODUODENOSCOPY N/A 08/31/2014   Procedure: ESOPHAGOGASTRODUODENOSCOPY (EGD);  Surgeon: Lollie Sails, MD;  Location: [facility] ENDOSCOPY;  Service: Endoscopy;  Laterality: N/A;  . FRACTURE SURGERY     Jaw fx repair; Hand procedure after trauma  . MANDIBLE FRACTURE SURGERY    . WRIST SURGERY     CTR    Prescriptions Prior to Admission  Medication Sig Dispense Refill Last Dose  . aspirin 81 MG tablet Take 81 mg by mouth daily.   12/02/2016 at 2100  . atorvastatin (LIPITOR) 40 MG tablet Take 40 mg by mouth daily.   12/02/2016 at 1800  . Calcium-Vitamin D (CALTRATE 600 PLUS-VIT D PO) Take 1 tablet by mouth 2 (two) times  daily.   12/03/2016 at 0800  . losartan-hydrochlorothiazide (HYZAAR) 100-25 MG per tablet Take 1 tablet by mouth daily.   12/03/2016 at 0800  . Multiple Vitamins-Minerals (CENTRUM SILVER PO) Take 1 tablet by mouth daily.   12/02/2016 at 0800  . omeprazole (PRILOSEC) 40 MG capsule Take 40 mg by mouth daily.   12/02/2016 at 0700   . Polyethylene Glycol 3350-GRX POWD Take 17 g by mouth daily as needed.   PRN at PRN  . acetaminophen (TYLENOL) 500 MG tablet Take 1,000 mg by mouth every 6 (six) hours as needed for moderate pain.   prn at prn   Social History   Social History  . Marital status: Married    Spouse name: N/A  . Number of children: N/A  . Years of education: N/A   Occupational History  . Not on file.   Social History Main Topics  . Smoking status: Never Smoker  . Smokeless tobacco: Never Used  . Alcohol use No  . Drug use: No  . Sexual activity: Not on file   Other Topics Concern  . Not on file   Social History Narrative   Lives with husband at home, independent and still active, working    Family History  Problem Relation Age of Onset  . Heart failure Mother   . CAD Father   . Breast cancer Neg Hx       Review of systems complete and found to be negative unless listed above      PHYSICAL EXAM  General: Well developed, well nourished, Pleasant, in no acute distress HEENT:  Normocephalic and atramatic Neck:  No JVD.  Lungs: Clear bilaterally to auscultation. Normal effort of breathing on room air Heart: HRRR . Normal S1 and S2 without gallops or murmurs.  Abdomen: Bowel sounds are positive, abdomen soft and non-tender  Msk:  Back normal, able to sit upright in bed. Gait not assessed. Normal strength and tone for age. Extremities: No clubbing, cyanosis or edema.   Neuro: Alert and oriented X 3. Psych:  Good affect, responds appropriately  Labs:   Lab Results  Component Value Date   WBC 8.4 12/03/2016   HGB 14.1 12/03/2016   HCT 40.9 12/03/2016   MCV 96.1 12/03/2016   PLT 231 12/03/2016    Recent Labs Lab 12/03/16 1059  NA 135  K 4.5  CL 101  CO2 24  BUN 22*  CREATININE 0.62  CALCIUM 9.2  GLUCOSE 115*   Lab Results  Component Value Date   TROPONINI <0.03 12/03/2016   No results found for: CHOL No results found for: HDL No results found for: LDLCALC No  results found for: TRIG No results found for: CHOLHDL No results found for: LDLDIRECT    Radiology: Dg Chest 2 View  Result Date: 12/03/2016 CLINICAL DATA:  Shortness of breath and chest tightness.  Nausea. EXAM: CHEST  2 VIEW COMPARISON:  None. FINDINGS: There is mild scarring in the upper lobes. There is no edema or consolidation. The heart size and pulmonary vascularity are normal. No adenopathy. There is degenerative change in the thoracic spine. There is thoracolumbar levoscoliosis. IMPRESSION: Mild upper lobe scarring bilaterally.  No edema or consolidation. Electronically Signed   By: Lowella Grip III M.D.   On: 12/03/2016 11:52   Ct Angio Chest/abd/pel For Dissection W And/or Wo Contrast  Result Date: 12/03/2016 CLINICAL DATA:  Chest and back pain, acute. Aortic dissection is suspected. EXAM: CT ANGIOGRAPHY CHEST, ABDOMEN AND PELVIS TECHNIQUE: Multidetector CT  imaging through the chest, abdomen and pelvis was performed using the standard protocol during bolus administration of intravenous contrast. Multiplanar reconstructed images and MIPs were obtained and reviewed to evaluate the vascular anatomy. CONTRAST:  100 cc Isovue 370 intravenous COMPARISON:  None. FINDINGS: CTA CHEST FINDINGS Cardiovascular: Noncontrast phase shows no evidence of intramural hematoma. Preferential opacification of the thoracic aorta. No evidence of thoracic aortic aneurysm or dissection. Normal heart size. No pericardial effusion. There is atherosclerotic calcification of the aorta and coronaries. No evidence of pulmonary embolism. Mediastinum/Nodes: Negative for adenopathy or mass. Lungs/Pleura: There is no edema, consolidation, effusion, or pneumothorax. Musculoskeletal: No acute or aggressive finding. Thoracic disc degeneration spurring. Review of the MIP images confirms the above findings. CTA ABDOMEN AND PELVIS FINDINGS VASCULAR Aorta: Negative for aneurysm or dissection. Mild atheromatous changes. Celiac:  Atherosclerosis at the ostium without stenosis or beading. Tortuosity of the splenic artery and to a lesser extent the common hepatic artery. SMA: Smooth and widely patent vessels. Renals: Atheromatous plaque at the renal artery origins without stenosis. Negative for beading or aneurysm. There is a small accessory right inferior pole extensor renal artery. IMA: Patent Inflow: Tortuosity without stenosis or dissection. No aneurysm or atheromatous changes. Veins: Unremarkable in the arterial phase. Review of the MIP images confirms the above findings. NON-VASCULAR Hepatobiliary: No focal liver abnormality.Cholecystectomy which may account for the dilated common bile duct which measures 11 mm in diameter, mildly progressed since 2016. No evidence of choledocholithiasis. Pancreas: Unremarkable. Spleen: Unremarkable. Adrenals/Urinary Tract: Negative adrenals. No hydronephrosis or stone. Unremarkable bladder. Stomach/Bowel: No obstruction. Moderate colonic diverticulosis, most heavily affecting the distal colon. Prominent mucosal density of the stomach, but generalized and without masslike feature. Lymphatic:   No mass or adenopathy. Reproductive:Hysterectomy.  Negative adnexae. Other: No ascites or pneumoperitoneum. Musculoskeletal: Degenerative changes without acute or aggressive finding. Review of the MIP images confirms the above findings. IMPRESSION: 1. Negative for aortic dissection or aneurysm.  No acute finding. 2.  Aortic Atherosclerosis (ICD10-I70.0).  Coronary atherosclerosis. 3. Dilated common bile duct which could be related to previous cholecystectomy. Please correlate with biliary labs. Electronically Signed   By: Monte Fantasia M.D.   On: 12/03/2016 13:00    EKG: Sinus rhythm, 97 bpm  ASSESSMENT AND PLAN:  1. Chest pain, with no prior history of coronary artery disease, with initial troponin less than 0.03, without recurrence of significant chest pain after aspirin and nitroglycerin. ECG revealed  sinus tachycardia at a rate of 127 bpm with right bundle branch block and left anterior fascicular block.  2. Essential hypertension, low normal on current BP medications. 3. Hyperlipidemia   Recommendations: 1. Continue current medications. 2. Cycle cardiac enzymes 3. Continue aspirin, losartan, atorvastatin, and as needed nitroglycerin 4. Proceed with Lexiscan Myoview in the morning, pending cardiac enzymes 5. Further recommendations pending cycled cardiac enzymes, and results of Myoview.   Signed: Clabe Seal PA-C 12/03/2016, 4:23 PM

## 2016-12-03 NOTE — ED Provider Notes (Signed)
[facility] Emergency Department Provider Note       Time seen: ----------------------------------------- 11:16 AM on 12/03/2016 -----------------------------------------     I have reviewed the triage vital signs and the nursing notes.   HISTORY   Chief Complaint Chest Pain    HPI Shelly Armstrong is a 71 y.o. female who presents to the ED for chest tightness that began this morning while at work. Patient states the pain goes to her back with cold sweats and shortness of breath. She does have some left-sided neck tightness that is 6 out of 10. Patient states she's had chest tightness, sweats, nausea and shortness of breath but has never had these symptoms before. Currently the nausea seems to be resolved. She takes a baby aspirin daily and has never had a heart attack or symptoms like this.   Past Medical History:  Diagnosis Date  . Chickenpox   . Edema    feet/legs  . Hyperglycemia   . Hyperlipidemia   . Hypertension   . Osteopenia   . PONV (postoperative nausea and vomiting)    after hand surgery    There are no active problems to display for this patient.   Past Surgical History:  Procedure Laterality Date  . ABDOMINAL HYSTERECTOMY    . BREAST BIOPSY Left 2001   stereotactic biopsy - negative  . BREAST SURGERY     Breast excisional biopsy  . CATARACT EXTRACTION W/PHACO Right 10/19/2014   Procedure: CATARACT EXTRACTION PHACO AND INTRAOCULAR LENS PLACEMENT (IOC);  Surgeon: Birder Robson, MD;  Location: [facility] ORS;  Service: Ophthalmology;  Laterality: Right;  casette lot# [id number]   [place]  00:29 AP17.7 CDE 5.09  . CATARACT EXTRACTION W/PHACO Left 11/02/2014   Procedure: CATARACT EXTRACTION PHACO AND INTRAOCULAR LENS PLACEMENT (IOC);  Surgeon: Birder Robson, MD;  Location: [facility] ORS;  Service: Ophthalmology;  Laterality: Left;  [place]: 00:34.2 AP%: 22.5 CDE: 7.71  Fluid lot# [id number]  . CHOLECYSTECTOMY    . COLONOSCOPY WITH PROPOFOL N/A  08/31/2014   Procedure: COLONOSCOPY WITH PROPOFOL;  Surgeon: Lollie Sails, MD;  Location: [facility] ENDOSCOPY;  Service: Endoscopy;  Laterality: N/A;  . ctr    . ESOPHAGOGASTRODUODENOSCOPY N/A 08/31/2014   Procedure: ESOPHAGOGASTRODUODENOSCOPY (EGD);  Surgeon: Lollie Sails, MD;  Location: [facility] ENDOSCOPY;  Service: Endoscopy;  Laterality: N/A;  . FRACTURE SURGERY     Jaw fx repair; Hand procedure after trauma  . MANDIBLE FRACTURE SURGERY    . WRIST SURGERY     CTR    Allergies Septra [sulfamethoxazole-trimethoprim]  Social History Social History  Substance Use Topics  . Smoking status: Never Smoker  . Smokeless tobacco: Never Used  . Alcohol use No    Review of Systems Constitutional: Negative for fever. Eyes: Negative for vision changes ENT:  Negative for congestion, sore throat Cardiovascular:Positive for chest pain Respiratory: Positive for shortness of breath Gastrointestinal: Negative for abdominal pain, positive for nausea Genitourinary: Negative for dysuria. Musculoskeletal: Negative for back pain. Skin: Positive for sweats Neurological: Negative for headaches, focal weakness or numbness.  All systems negative/normal/unremarkable except as stated in the HPI  ____________________________________________   PHYSICAL EXAM:  VITAL SIGNS: ED Triage Vitals  Enc Vitals Group     BP 12/03/16 1058 105/60     Pulse Rate 12/03/16 1058 (!) 124     Resp 12/03/16 1058 18     Temp 12/03/16 1058 98.1 F (36.7 C)     Temp Source 12/03/16 1058 Oral     SpO2 12/03/16  1058 98 %     Weight 12/03/16 1056 170 lb (77.1 kg)     Height 12/03/16 1056 5\' 3"  (1.6 m)     Head Circumference --      Peak Flow --      Pain Score 12/03/16 1056 6     Pain Loc --      Pain Edu? --      Excl. in [place]? --     Constitutional: Alert and oriented. Mild distress Eyes: Conjunctivae are normal. Normal extraocular movements. ENT   Head: Normocephalic and atraumatic.   Nose: No  congestion/rhinnorhea.   Mouth/Throat: Mucous membranes are moist.   Neck: No stridor. Cardiovascular: Normal rate, regular rhythm. No murmurs, rubs, or gallops. Respiratory: Normal respiratory effort without tachypnea nor retractions. Breath sounds are clear and equal bilaterally. No wheezes/rales/rhonchi. Gastrointestinal: Soft and nontender. Normal bowel sounds Musculoskeletal: Nontender with normal range of motion in extremities. No lower extremity tenderness nor edema. Neurologic:  Normal speech and language. No gross focal neurologic deficits are appreciated.  Skin:  Skin is warm, dry and intact. No rash noted. Psychiatric: Mood and affect are normal. Speech and behavior are normal.  ____________________________________________  EKG: Interpreted by me. Sinus tachycardia with first-degree AV block, wide QRS, right bundle branch block, left anterior fascicular block  ____________________________________________  ED COURSE:  Pertinent labs & imaging results that were available during my care of the patient were reviewed by me and considered in my medical decision making (see chart for details). Patient presents for chest pain and concerning symptoms for ACS, we will assess with labs and imaging as indicated.   Procedures ____________________________________________   LABS (pertinent positives/negatives)  Labs Reviewed  CBC  BASIC METABOLIC PANEL  TROPONIN I   CRITICAL CARE Performed by: Earleen Newport   Total critical care time: 30 minutes  Critical care time was exclusive of separately billable procedures and treating other patients.  Critical care was necessary to treat or prevent imminent or life-threatening deterioration.  Critical care was time spent personally by me on the following activities: development of treatment plan with patient and/or surrogate as well as nursing, discussions with consultants, evaluation of patient's response to treatment,  examination of patient, obtaining history from patient or surrogate, ordering and performing treatments and interventions, ordering and review of laboratory studies, ordering and review of radiographic studies, pulse oximetry and re-evaluation of patient's condition.  RADIOLOGY Images were viewed by me  Chest x-ray, CT angiogram IMPRESSION: 1. Negative for aortic dissection or aneurysm. No acute finding. 2. Aortic Atherosclerosis (ICD10-I70.0). Coronary atherosclerosis. 3. Dilated common bile duct which could be related to previous cholecystectomy. Please correlate with biliary labs. ____________________________________________  FINAL ASSESSMENT AND PLAN  Chest pain   Plan: Patient's labs and imaging were dictated above. Patient had presented for chest pain with sweats, nausea, shortness of breath and pain that radiates into her back. I do think this represents unstable angina. She is improved with nitroglycerin and we have given a dose of Lovenox. She is stable for cardiac rule out at this time.   Earleen Newport, MD   Note: This note was generated in part or whole with voice recognition software. Voice recognition is usually quite accurate but there are transcription errors that can and very often do occur. I apologize for any typographical errors that were not detected and corrected.     Earleen Newport, MD 12/03/16 1310

## 2016-12-04 ENCOUNTER — Encounter: Payer: Self-pay | Admitting: Radiology

## 2016-12-04 ENCOUNTER — Observation Stay
Admit: 2016-12-04 | Discharge: 2016-12-04 | Disposition: A | Discharge status: Home / Self Care | Payer: PPO | Attending: Internal Medicine | Admitting: Internal Medicine

## 2016-12-04 ENCOUNTER — Observation Stay: Payer: PPO

## 2016-12-04 DIAGNOSIS — R079 Chest pain, unspecified: Secondary | ICD-10-CM | POA: Diagnosis not present

## 2016-12-04 DIAGNOSIS — K219 Gastro-esophageal reflux disease without esophagitis: Secondary | ICD-10-CM | POA: Diagnosis not present

## 2016-12-04 DIAGNOSIS — I249 Acute ischemic heart disease, unspecified: Secondary | ICD-10-CM | POA: Diagnosis not present

## 2016-12-04 LAB — CBC
HEMATOCRIT: 36.9 % (ref 35.0–47.0)
Hemoglobin: 12.8 g/dL (ref 12.0–16.0)
MCH: 33.6 pg (ref 26.0–34.0)
MCHC: 34.6 g/dL (ref 32.0–36.0)
MCV: 97.2 fL (ref 80.0–100.0)
Platelets: 176 10*3/uL (ref 150–440)
RBC: 3.8 MIL/uL (ref 3.80–5.20)
RDW: 13.6 % (ref 11.5–14.5)
WBC: 5.4 10*3/uL (ref 3.6–11.0)

## 2016-12-04 LAB — BASIC METABOLIC PANEL
ANION GAP: 7 (ref 5–15)
BUN: 18 mg/dL (ref 6–20)
CO2: 25 mmol/L (ref 22–32)
Calcium: 8.3 mg/dL — ABNORMAL LOW (ref 8.9–10.3)
Chloride: 105 mmol/L (ref 101–111)
Creatinine, Ser: 0.58 mg/dL (ref 0.44–1.00)
GFR calc Af Amer: >60 mL/min (ref >60)
GLUCOSE: 118 mg/dL — AB (ref 65–99)
POTASSIUM: 3.9 mmol/L (ref 3.5–5.1)
SODIUM: 137 mmol/L (ref 135–145)

## 2016-12-04 LAB — NM MYOCAR MULTI W/SPECT W/WALL MOTION / EF
CHL CUP MPHR: 149 {beats}/min
CHL CUP NUCLEAR SDS: 1
CHL CUP NUCLEAR SRS: 15
CSEPED: 1 min
CSEPEW: 1 METS
Exercise duration (sec): 0 s
LV sys vol: 15 mL
LVDIAVOL: 51 mL (ref 46–106)
NUC STRESS TID: 1
Peak HR: 100 {beats}/min
Percent HR: 67 %
Rest HR: 69 {beats}/min
SSS: 5

## 2016-12-04 LAB — TROPONIN I: Troponin I: 0.13 ng/mL (ref <0.03)

## 2016-12-04 LAB — HEPARIN LEVEL (UNFRACTIONATED): HEPARIN UNFRACTIONATED: 0.93 [IU]/mL — AB (ref 0.30–0.70)

## 2016-12-04 MED ORDER — TECHNETIUM TC 99M TETROFOSMIN IV KIT
31.2590 | PACK | Freq: Once | INTRAVENOUS | Status: AC | PRN
  Administered 2016-12-04: 31.259 via INTRAVENOUS

## 2016-12-04 MED ORDER — TECHNETIUM TC 99M TETROFOSMIN IV KIT
10.0000 | PACK | Freq: Once | INTRAVENOUS | Status: AC | PRN
  Administered 2016-12-04: 13.08 via INTRAVENOUS

## 2016-12-04 MED ORDER — REGADENOSON 0.4 MG/5ML IV SOLN
0.4000 mg | Freq: Once | INTRAVENOUS | Status: AC
  Administered 2016-12-04: 0.4 mg via INTRAVENOUS

## 2016-12-04 NOTE — Progress Notes (Signed)
Patient alert and oriented, vss, no complaints of pain.  Removed telemetry and removed PIV.  No questions at this time.  Patient to be escorted out of hospital via wheelchair by volunteers.

## 2016-12-04 NOTE — Discharge Summary (Signed)
[place] [facility] [place] NAME: Shelly Armstrong    MR#:  [id number]  DATE OF BIRTH:  [birth year]  DATE OF ADMISSION:  12/03/2016 ADMITTING PHYSICIAN: Gladstone Lighter, MD  DATE OF DISCHARGE: 12/04/2016  4:39 PM  PRIMARY CARE PHYSICIAN: Kirk Ruths, MD   ADMISSION DIAGNOSIS:  Nonspecific chest pain [R07.9]  DISCHARGE DIAGNOSIS:  Active Problems:   Unstable angina ([place])   SECONDARY DIAGNOSIS:   Past Medical History:  Diagnosis Date  . Chickenpox   . Edema    feet/legs  . Hyperglycemia   . Hyperlipidemia   . Hypertension   . Osteopenia   . PONV (postoperative nausea and vomiting)    after hand surgery     ADMITTING HISTORY  HISTORY OF PRESENT ILLNESS:  Shelly Armstrong  is a 71 y.o. female with a known history of hypertension, hyperlipidemia, osteopenia presents to hospital secondary to sudden onset of chest tightness that started this morning. Patient says that she's never had prior chest tightness or chest pain symptoms. No prior cardiac history. She woke up fine this morning, ate her breakfast went to work, 30 minutes later started feeling diaphoretic, sweat dripping down her forehead and she felt extensive tightness in her chest. She also had dyspneic symptoms which she feels secondary to anxiety from her chest tightness. Her symptoms remained until she presented to the ER and got aspirin and nitroglycerin. Right now she is symptom free. First troponin is negative. EKG with right bundle branch block. She is being admitted for unstable angina.   HOSPITAL COURSE:   * chest pain likely due to GERD Patient was admitted to telemetry floor. Repeat troponin was mildly increased and due to concern regarding possible non-ST elevation MI heparin drip was started Overnight. Troponin remained stable and the heparin drip was stopped. Patient was seen by cardiology. A Myoview stress test was low risk scan without any reversible ischemia.  Discussed with Dr. Tomasita Crumble shows of cardiology. Patient is being discharged home with Prilosec for possible GERD causing chest pain. Other comorbidities remained stable and medications were not changed.  Patient discharged home in stable condition. Advised to follow-up with Dr. Tomasita Crumble shows in 1 week. May need cardiac catheterization if recurrent chest pain.  CONSULTS OBTAINED:  Treatment Team:  Isaias Cowman, MD  DRUG ALLERGIES:   Allergies  Allergen Reactions  . Septra [Sulfamethoxazole-Trimethoprim] Rash    DISCHARGE MEDICATIONS:   Discharge Medication List as of 12/04/2016  4:08 PM    CONTINUE these medications which have NOT CHANGED   Details  aspirin 81 MG tablet Take 81 mg by mouth daily., Until Discontinued, Historical Med    atorvastatin (LIPITOR) 40 MG tablet Take 40 mg by mouth daily., Historical Med    Calcium-Vitamin D (CALTRATE 600 PLUS-VIT D PO) Take 1 tablet by mouth 2 (two) times daily., Until Discontinued, Historical Med    losartan-hydrochlorothiazide (HYZAAR) 100-25 MG per tablet Take 1 tablet by mouth daily., Until Discontinued, Historical Med    Multiple Vitamins-Minerals (CENTRUM SILVER PO) Take 1 tablet by mouth daily., Historical Med    omeprazole (PRILOSEC) 40 MG capsule Take 40 mg by mouth daily., Until Discontinued, Historical Med    Polyethylene Glycol 3350-GRX POWD Take 17 g by mouth daily as needed., Historical Med    acetaminophen (TYLENOL) 500 MG tablet Take 1,000 mg by mouth every 6 (six) hours as needed for moderate pain., Until Discontinued, Historical Med        Today   VITAL  SIGNS:  Blood pressure (!) 136/49, pulse 83, temperature 98.2 F (36.8 C), temperature source Oral, resp. rate 18, height 5\' 3"  (1.6 m), weight 77.1 kg (170 lb), SpO2 98 %.  I/O:   Intake/Output Summary (Last 24 hours) at 12/04/16 1754 Last data filed at 12/04/16 1359  Gross per 24 hour  Intake           543.83 ml  Output              450 ml  Net             93.83 ml    PHYSICAL EXAMINATION:  Physical Exam  GENERAL:  71 y.o.-year-old patient lying in the bed with no acute distress.  LUNGS: Normal breath sounds bilaterally, no wheezing, rales,rhonchi or crepitation. No use of accessory muscles of respiration.  CARDIOVASCULAR: S1, S2 normal. No murmurs, rubs, or gallops.  ABDOMEN: Soft, non-tender, non-distended. Bowel sounds present. No organomegaly or mass.  NEUROLOGIC: Moves all 4 extremities. PSYCHIATRIC: The patient is alert and oriented x 3.  SKIN: No obvious rash, lesion, or ulcer.   DATA REVIEW:   CBC  Recent Labs Lab 12/04/16 0458  WBC 5.4  HGB 12.8  HCT 36.9  PLT 176    Chemistries   Recent Labs Lab 12/04/16 0458  NA 137  K 3.9  CL 105  CO2 25  GLUCOSE 118*  BUN 18  CREATININE 0.58  CALCIUM 8.3*    Cardiac Enzymes  Recent Labs Lab 12/04/16 0458  TROPONINI 0.13*    Microbiology Results  No results found for this or any previous visit.  RADIOLOGY:  Dg Chest 2 View  Result Date: 12/03/2016 CLINICAL DATA:  Shortness of breath and chest tightness.  Nausea. EXAM: CHEST  2 VIEW COMPARISON:  None. FINDINGS: There is mild scarring in the upper lobes. There is no edema or consolidation. The heart size and pulmonary vascularity are normal. No adenopathy. There is degenerative change in the thoracic spine. There is thoracolumbar levoscoliosis. IMPRESSION: Mild upper lobe scarring bilaterally.  No edema or consolidation. Electronically Signed   By: Lowella Grip III M.D.   On: 12/03/2016 11:52   Nm Myocar Multi W/spect W/wall Motion / Ef  Result Date: 12/04/2016  Blood pressure demonstrated a normal response to exercise.  The study is normal.  This is a low risk study.  The left ventricular ejection fraction is normal (55-65%).    Ct Angio Chest/abd/pel For Dissection W And/or Wo Contrast  Result Date: 12/03/2016 CLINICAL DATA:  Chest and back pain, acute. Aortic dissection is suspected. EXAM:  CT ANGIOGRAPHY CHEST, ABDOMEN AND PELVIS TECHNIQUE: Multidetector CT imaging through the chest, abdomen and pelvis was performed using the standard protocol during bolus administration of intravenous contrast. Multiplanar reconstructed images and MIPs were obtained and reviewed to evaluate the vascular anatomy. CONTRAST:  100 cc Isovue 370 intravenous COMPARISON:  None. FINDINGS: CTA CHEST FINDINGS Cardiovascular: Noncontrast phase shows no evidence of intramural hematoma. Preferential opacification of the thoracic aorta. No evidence of thoracic aortic aneurysm or dissection. Normal heart size. No pericardial effusion. There is atherosclerotic calcification of the aorta and coronaries. No evidence of pulmonary embolism. Mediastinum/Nodes: Negative for adenopathy or mass. Lungs/Pleura: There is no edema, consolidation, effusion, or pneumothorax. Musculoskeletal: No acute or aggressive finding. Thoracic disc degeneration spurring. Review of the MIP images confirms the above findings. CTA ABDOMEN AND PELVIS FINDINGS VASCULAR Aorta: Negative for aneurysm or dissection. Mild atheromatous changes. Celiac: Atherosclerosis at the ostium without stenosis or beading.  Tortuosity of the splenic artery and to a lesser extent the common hepatic artery. SMA: Smooth and widely patent vessels. Renals: Atheromatous plaque at the renal artery origins without stenosis. Negative for beading or aneurysm. There is a small accessory right inferior pole extensor renal artery. IMA: Patent Inflow: Tortuosity without stenosis or dissection. No aneurysm or atheromatous changes. Veins: Unremarkable in the arterial phase. Review of the MIP images confirms the above findings. NON-VASCULAR Hepatobiliary: No focal liver abnormality.Cholecystectomy which may account for the dilated common bile duct which measures 11 mm in diameter, mildly progressed since 2016. No evidence of choledocholithiasis. Pancreas: Unremarkable. Spleen: Unremarkable.  Adrenals/Urinary Tract: Negative adrenals. No hydronephrosis or stone. Unremarkable bladder. Stomach/Bowel: No obstruction. Moderate colonic diverticulosis, most heavily affecting the distal colon. Prominent mucosal density of the stomach, but generalized and without masslike feature. Lymphatic:   No mass or adenopathy. Reproductive:Hysterectomy.  Negative adnexae. Other: No ascites or pneumoperitoneum. Musculoskeletal: Degenerative changes without acute or aggressive finding. Review of the MIP images confirms the above findings. IMPRESSION: 1. Negative for aortic dissection or aneurysm.  No acute finding. 2.  Aortic Atherosclerosis (ICD10-I70.0).  Coronary atherosclerosis. 3. Dilated common bile duct which could be related to previous cholecystectomy. Please correlate with biliary labs. Electronically Signed   By: Monte Fantasia M.D.   On: 12/03/2016 13:00    Follow up with PCP in 1 week.  Management plans discussed with the patient, family and they are in agreement.  CODE STATUS:     Code Status Orders        Start     Ordered   12/03/16 1554  Full code  Continuous     12/03/16 1553    Code Status History    Date Active Date Inactive Code Status Order ID Comments User Context   This patient has a current code status but no historical code status.    Advance Directive Documentation     Most Recent Value  Type of Advance Directive  Healthcare Power of Attorney, Living will  Pre-existing out of facility DNR order (yellow form or pink MOST form)  -  "MOST" Form in Place?  -      TOTAL TIME TAKING CARE OF THIS PATIENT ON DAY OF DISCHARGE: more than 30 minutes.   Hillary Bow R M.D on 12/04/2016 at 5:54 PM  Between 7am to 6pm - Pager - [phone]  After 6pm go to [email or web address] - password [email or web address] [facility]  Office  [phone]  CC: Primary care physician; Kirk Ruths, MD  Note: This dictation was prepared with Dragon dictation along with  smaller phrase technology. Any transcriptional errors that result from this process are unintentional.

## 2016-12-04 NOTE — Care Management Obs Status (Signed)
[place] NOTIFICATION   Patient Details  Name: Shelly Armstrong MRN: [id number] Date of Birth: [birth year]   Medicare Observation Status Notification Given:  No < 24 hours   Katrina Stack, RN 12/04/2016, 4:35 PM

## 2016-12-05 LAB — ECHOCARDIOGRAM COMPLETE
Height: 63 in
WEIGHTICAEL: 2720 [oz_av]

## 2016-12-10 DIAGNOSIS — R110 Nausea: Secondary | ICD-10-CM | POA: Diagnosis not present

## 2016-12-10 DIAGNOSIS — R1013 Epigastric pain: Secondary | ICD-10-CM | POA: Diagnosis not present

## 2016-12-10 DIAGNOSIS — M546 Pain in thoracic spine: Secondary | ICD-10-CM | POA: Diagnosis not present

## 2016-12-11 DIAGNOSIS — I200 Unstable angina: Secondary | ICD-10-CM | POA: Diagnosis not present

## 2016-12-11 DIAGNOSIS — E782 Mixed hyperlipidemia: Secondary | ICD-10-CM | POA: Diagnosis not present

## 2016-12-11 DIAGNOSIS — I10 Essential (primary) hypertension: Secondary | ICD-10-CM | POA: Diagnosis not present

## 2017-01-08 DIAGNOSIS — B070 Plantar wart: Secondary | ICD-10-CM | POA: Diagnosis not present

## 2017-01-24 DIAGNOSIS — D2370 Other benign neoplasm of skin of unspecified lower limb, including hip: Secondary | ICD-10-CM | POA: Diagnosis not present

## 2017-01-24 DIAGNOSIS — M79671 Pain in right foot: Secondary | ICD-10-CM | POA: Diagnosis not present

## 2017-03-22 ENCOUNTER — Other Ambulatory Visit: Payer: Self-pay | Admitting: Internal Medicine

## 2017-03-22 DIAGNOSIS — Z1231 Encounter for screening mammogram for malignant neoplasm of breast: Secondary | ICD-10-CM

## 2017-04-11 ENCOUNTER — Ambulatory Visit
Admission: RE | Admit: 2017-04-11 | Discharge: 2017-04-11 | Disposition: A | Discharge status: Home / Self Care | Payer: PPO | Source: Ambulatory Visit | Attending: Internal Medicine | Admitting: Internal Medicine

## 2017-04-11 DIAGNOSIS — Z1231 Encounter for screening mammogram for malignant neoplasm of breast: Secondary | ICD-10-CM | POA: Insufficient documentation

## 2017-04-18 DIAGNOSIS — I10 Essential (primary) hypertension: Secondary | ICD-10-CM | POA: Diagnosis not present

## 2017-04-18 DIAGNOSIS — E782 Mixed hyperlipidemia: Secondary | ICD-10-CM | POA: Diagnosis not present

## 2017-04-18 DIAGNOSIS — R739 Hyperglycemia, unspecified: Secondary | ICD-10-CM | POA: Diagnosis not present

## 2017-04-25 DIAGNOSIS — I10 Essential (primary) hypertension: Secondary | ICD-10-CM | POA: Diagnosis not present

## 2017-04-25 DIAGNOSIS — Z0000 Encounter for general adult medical examination without abnormal findings: Secondary | ICD-10-CM | POA: Diagnosis not present

## 2017-04-25 DIAGNOSIS — E782 Mixed hyperlipidemia: Secondary | ICD-10-CM | POA: Diagnosis not present

## 2017-04-25 DIAGNOSIS — R739 Hyperglycemia, unspecified: Secondary | ICD-10-CM | POA: Diagnosis not present

## 2017-10-17 DIAGNOSIS — E782 Mixed hyperlipidemia: Secondary | ICD-10-CM | POA: Diagnosis not present

## 2017-10-17 DIAGNOSIS — I10 Essential (primary) hypertension: Secondary | ICD-10-CM | POA: Diagnosis not present

## 2017-10-17 DIAGNOSIS — R739 Hyperglycemia, unspecified: Secondary | ICD-10-CM | POA: Diagnosis not present

## 2017-10-24 DIAGNOSIS — E782 Mixed hyperlipidemia: Secondary | ICD-10-CM | POA: Diagnosis not present

## 2017-10-24 DIAGNOSIS — M8580 Other specified disorders of bone density and structure, unspecified site: Secondary | ICD-10-CM | POA: Diagnosis not present

## 2017-10-24 DIAGNOSIS — I10 Essential (primary) hypertension: Secondary | ICD-10-CM | POA: Diagnosis not present

## 2017-10-24 DIAGNOSIS — R739 Hyperglycemia, unspecified: Secondary | ICD-10-CM | POA: Diagnosis not present

## 2018-01-31 ENCOUNTER — Other Ambulatory Visit: Payer: Self-pay

## 2018-01-31 ENCOUNTER — Emergency Department: Payer: PPO

## 2018-01-31 ENCOUNTER — Encounter: Payer: Self-pay | Admitting: Emergency Medicine

## 2018-01-31 ENCOUNTER — Emergency Department
Admission: EM | Admit: 2018-01-31 | Discharge: 2018-01-31 | Disposition: A | Discharge status: Home / Self Care | Payer: PPO | Attending: Emergency Medicine | Admitting: Emergency Medicine

## 2018-01-31 DIAGNOSIS — S0993XA Unspecified injury of face, initial encounter: Secondary | ICD-10-CM | POA: Diagnosis not present

## 2018-01-31 DIAGNOSIS — Z79899 Other long term (current) drug therapy: Secondary | ICD-10-CM | POA: Insufficient documentation

## 2018-01-31 DIAGNOSIS — Y999 Unspecified external cause status: Secondary | ICD-10-CM | POA: Diagnosis not present

## 2018-01-31 DIAGNOSIS — S0591XA Unspecified injury of right eye and orbit, initial encounter: Secondary | ICD-10-CM | POA: Diagnosis present

## 2018-01-31 DIAGNOSIS — S0511XA Contusion of eyeball and orbital tissues, right eye, initial encounter: Secondary | ICD-10-CM | POA: Insufficient documentation

## 2018-01-31 DIAGNOSIS — Y939 Activity, unspecified: Secondary | ICD-10-CM | POA: Insufficient documentation

## 2018-01-31 DIAGNOSIS — I10 Essential (primary) hypertension: Secondary | ICD-10-CM | POA: Diagnosis not present

## 2018-01-31 DIAGNOSIS — Z7982 Long term (current) use of aspirin: Secondary | ICD-10-CM | POA: Diagnosis not present

## 2018-01-31 DIAGNOSIS — Y92481 Parking lot as the place of occurrence of the external cause: Secondary | ICD-10-CM | POA: Insufficient documentation
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
## 2018-03-27 ENCOUNTER — Other Ambulatory Visit: Payer: Self-pay | Admitting: Internal Medicine

## 2018-03-27 DIAGNOSIS — Z1231 Encounter for screening mammogram for malignant neoplasm of breast: Secondary | ICD-10-CM

## 2018-04-16 ENCOUNTER — Ambulatory Visit
Admission: RE | Admit: 2018-04-16 | Discharge: 2018-04-16 | Disposition: A | Discharge status: Home / Self Care | Payer: PPO | Source: Ambulatory Visit | Attending: Internal Medicine | Admitting: Internal Medicine

## 2018-04-16 DIAGNOSIS — Z1231 Encounter for screening mammogram for malignant neoplasm of breast: Secondary | ICD-10-CM | POA: Diagnosis not present

## 2018-05-01 DIAGNOSIS — R739 Hyperglycemia, unspecified: Secondary | ICD-10-CM | POA: Diagnosis not present

## 2018-05-01 DIAGNOSIS — E782 Mixed hyperlipidemia: Secondary | ICD-10-CM | POA: Diagnosis not present

## 2018-05-01 DIAGNOSIS — I10 Essential (primary) hypertension: Secondary | ICD-10-CM | POA: Diagnosis not present

## 2018-05-08 DIAGNOSIS — Z0000 Encounter for general adult medical examination without abnormal findings: Secondary | ICD-10-CM | POA: Diagnosis not present

## 2018-05-08 DIAGNOSIS — E782 Mixed hyperlipidemia: Secondary | ICD-10-CM | POA: Diagnosis not present

## 2018-05-08 DIAGNOSIS — I10 Essential (primary) hypertension: Secondary | ICD-10-CM | POA: Diagnosis not present

## 2018-05-08 DIAGNOSIS — R739 Hyperglycemia, unspecified: Secondary | ICD-10-CM | POA: Diagnosis not present

## 2018-06-14 ENCOUNTER — Other Ambulatory Visit: Payer: Self-pay

## 2018-06-14 ENCOUNTER — Emergency Department
Admission: EM | Admit: 2018-06-14 | Discharge: 2018-06-14 | Disposition: A | Discharge status: Home / Self Care | Payer: PRIVATE HEALTH INSURANCE | Attending: Emergency Medicine | Admitting: Emergency Medicine

## 2018-06-14 ENCOUNTER — Emergency Department: Payer: PRIVATE HEALTH INSURANCE

## 2018-06-14 DIAGNOSIS — Y9301 Activity, walking, marching and hiking: Secondary | ICD-10-CM | POA: Insufficient documentation

## 2018-06-14 DIAGNOSIS — S42201A Unspecified fracture of upper end of right humerus, initial encounter for closed fracture: Secondary | ICD-10-CM | POA: Diagnosis not present

## 2018-06-14 DIAGNOSIS — W010XXA Fall on same level from slipping, tripping and stumbling without subsequent striking against object, initial encounter: Secondary | ICD-10-CM | POA: Insufficient documentation

## 2018-06-14 DIAGNOSIS — Y9289 Other specified places as the place of occurrence of the external cause: Secondary | ICD-10-CM | POA: Diagnosis not present

## 2018-06-14 DIAGNOSIS — M25519 Pain in unspecified shoulder: Secondary | ICD-10-CM | POA: Diagnosis not present

## 2018-06-14 DIAGNOSIS — Z7982 Long term (current) use of aspirin: Secondary | ICD-10-CM | POA: Insufficient documentation

## 2018-06-14 DIAGNOSIS — S42291A Other displaced fracture of upper end of right humerus, initial encounter for closed fracture: Secondary | ICD-10-CM | POA: Diagnosis not present

## 2018-06-14 DIAGNOSIS — I10 Essential (primary) hypertension: Secondary | ICD-10-CM | POA: Insufficient documentation

## 2018-06-14 DIAGNOSIS — Z79899 Other long term (current) drug therapy: Secondary | ICD-10-CM | POA: Diagnosis not present

## 2018-06-14 DIAGNOSIS — S4991XA Unspecified injury of right shoulder and upper arm, initial encounter: Secondary | ICD-10-CM | POA: Diagnosis present

## 2018-06-14 DIAGNOSIS — Y998 Other external cause status: Secondary | ICD-10-CM | POA: Insufficient documentation

## 2018-06-14 DIAGNOSIS — W19XXXA Unspecified fall, initial encounter: Secondary | ICD-10-CM | POA: Diagnosis not present

## 2018-06-14 DIAGNOSIS — E785 Hyperlipidemia, unspecified: Secondary | ICD-10-CM | POA: Insufficient documentation

## 2018-06-14 DIAGNOSIS — R52 Pain, unspecified: Secondary | ICD-10-CM | POA: Diagnosis not present

## 2018-06-14 MED ORDER — OXYCODONE-ACETAMINOPHEN 5-325 MG PO TABS: 1.0000 | ORAL_TABLET | Freq: Four times a day (QID) | ORAL | 0 refills | Status: AC | PRN

## 2018-06-14 MED ORDER — ACETAMINOPHEN 500 MG PO TABS
1000.0000 mg | ORAL_TABLET | Freq: Once | ORAL | Status: AC
  Administered 2018-06-14: 1000 mg via ORAL
  Filled 2018-06-14: qty 2

## 2018-06-14 MED ORDER — OXYCODONE HCL 5 MG PO TABS
5.0000 mg | ORAL_TABLET | Freq: Once | ORAL | Status: AC
  Administered 2018-06-14: 5 mg via ORAL
  Filled 2018-06-14: qty 1

## 2018-06-14 NOTE — ED Notes (Signed)
Pt in xray, will administer medication when pt returns.
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
## 2018-06-14 NOTE — ED Notes (Signed)
This RN attempted to make contact with [employer] multiple times for supervisor's decision on urine screen without success.

## 2018-06-14 NOTE — ED Provider Notes (Signed)
[facility] Emergency Department Provider Note   ____________________________________________   I have reviewed the triage vital signs and the nursing notes.   HISTORY  Chief Complaint Right shoulder pain  History limited by: Not Limited   HPI Shelly Armstrong is a 73 y.o. female who presents to the emergency department today via EMS because of concerns for right shoulder pain.  Patient was at work today when she tripped over an empty water carton.  She landed onto her right side.  She is having severe pain in that right shoulder.  It is worse with movement. She denies any radiation or numbness going down her arm.  The patient states that she also had bruising to her left shin but denies any pain.  She denies hitting her head. Denies any previous issues with that shoulder.   Records reviewed. Per medical record review patient has a history of HLD, HTN.  Past Medical History:  Diagnosis Date  . Chickenpox   . Edema    feet/legs  . Hyperglycemia   . Hyperlipidemia   . Hypertension   . Osteopenia   . PONV (postoperative nausea and vomiting)    after hand surgery    Patient Active Problem List   Diagnosis Date Noted  . Unstable angina ([place]) 12/03/2016    Past Surgical History:  Procedure Laterality Date  . ABDOMINAL HYSTERECTOMY    . BREAST BIOPSY Left 2001   stereotactic biopsy - negative  . BREAST SURGERY     Breast excisional biopsy  . CATARACT EXTRACTION W/PHACO Right 10/19/2014   Procedure: CATARACT EXTRACTION PHACO AND INTRAOCULAR LENS PLACEMENT (IOC);  Surgeon: Birder Robson, MD;  Location: [facility] ORS;  Service: Ophthalmology;  Laterality: Right;  casette lot# [id number]   [place]  00:29 AP17.7 CDE 5.09  . CATARACT EXTRACTION W/PHACO Left 11/02/2014   Procedure: CATARACT EXTRACTION PHACO AND INTRAOCULAR LENS PLACEMENT (IOC);  Surgeon: Birder Robson, MD;  Location: [facility] ORS;  Service: Ophthalmology;  Laterality: Left;  [place]: 00:34.2 AP%: 22.5 CDE:  7.71  Fluid lot# [id number]  . CHOLECYSTECTOMY    . COLONOSCOPY WITH PROPOFOL N/A 08/31/2014   Procedure: COLONOSCOPY WITH PROPOFOL;  Surgeon: Lollie Sails, MD;  Location: [facility] ENDOSCOPY;  Service: Endoscopy;  Laterality: N/A;  . ctr    . ESOPHAGOGASTRODUODENOSCOPY N/A 08/31/2014   Procedure: ESOPHAGOGASTRODUODENOSCOPY (EGD);  Surgeon: Lollie Sails, MD;  Location: [facility] ENDOSCOPY;  Service: Endoscopy;  Laterality: N/A;  . FRACTURE SURGERY     Jaw fx repair; Hand procedure after trauma  . MANDIBLE FRACTURE SURGERY    . WRIST SURGERY     CTR    Prior to Admission medications   Medication Sig Start Date End Date Taking? Authorizing Provider  acetaminophen (TYLENOL) 500 MG tablet Take 1,000 mg by mouth every 6 (six) hours as needed for moderate pain.    [provider]  aspirin 81 MG tablet Take 81 mg by mouth daily.    [provider]  atorvastatin (LIPITOR) 40 MG tablet Take 40 mg by mouth daily.    [provider]  Calcium-Vitamin D (CALTRATE 600 PLUS-VIT D PO) Take 1 tablet by mouth 2 (two) times daily.    [provider]  losartan-hydrochlorothiazide (HYZAAR) 100-25 MG per tablet Take 1 tablet by mouth daily.    [provider]  Multiple Vitamins-Minerals (CENTRUM SILVER PO) Take 1 tablet by mouth daily.    [provider]  omeprazole (PRILOSEC) 40 MG capsule Take 40 mg by mouth daily.  [provider]  Polyethylene Glycol 3350-GRX POWD Take 17 g by mouth daily as needed.    [provider]    Allergies Sulfamethoxazole-trimethoprim  Family History  Problem Relation Age of Onset  . Heart failure Mother   . CAD Father   . Breast cancer Neg Hx     Social History Social History   Tobacco Use  . Smoking status: Never Smoker  . Smokeless tobacco: Never Used  Substance Use Topics  . Alcohol use: No  . Drug use: No    Review of Systems Constitutional: No fever/chills Eyes: No visual  changes. ENT: No sore throat. Cardiovascular: Denies chest pain. Respiratory: Denies shortness of breath. Gastrointestinal: No abdominal pain.  No nausea, no vomiting.  No diarrhea.   Genitourinary: Negative for dysuria. Musculoskeletal: Positive for right shoulder pain.  Skin: Positive for bruising to the left shin.  Neurological: Negative for headaches, focal weakness or numbness.  ____________________________________________   PHYSICAL EXAM:  VITAL SIGNS: ED Triage Vitals  Enc Vitals Group     BP 06/14/18 1839 (!) 188/81     Pulse Rate 06/14/18 1839 65     Resp 06/14/18 1839 16     Temp 06/14/18 1839 97.6 F (36.4 C)     Temp Source 06/14/18 1839 Oral     SpO2 06/14/18 1839 100 %     Weight 06/14/18 1838 167 lb (75.8 kg)     Height 06/14/18 1838 5\' 3"  (1.6 m)     Head Circumference --      Peak Flow --      Pain Score 06/14/18 1837 10   Constitutional: Alert and oriented.  Eyes: Conjunctivae are normal.  ENT      Head: Normocephalic and atraumatic.      Nose: No congestion/rhinnorhea.      Mouth/Throat: Mucous membranes are moist.      Neck: No stridor. Hematological/Lymphatic/Immunilogical: No cervical lymphadenopathy. Cardiovascular: Normal rate, regular rhythm.  No murmurs, rubs, or gallops.  Respiratory: Normal respiratory effort without tachypnea nor retractions. Breath sounds are clear and equal bilaterally. No wheezes/rales/rhonchi. Gastrointestinal: Soft and non tender. No rebound. No guarding.  Genitourinary: Deferred Musculoskeletal: Some swelling to the right shoulder, tender to palpation and manipulation. Radial pulse 2+. Hematoma to left shin. No tenderness over the shin. Neurologic:  Normal speech and language. No gross focal neurologic deficits are appreciated.  Skin:  Skin is warm, dry and intact. No rash noted. Psychiatric: Mood and affect are normal. Speech and behavior are normal. Patient exhibits appropriate insight and  judgment.  ____________________________________________    LABS (pertinent positives/negatives)  None  ____________________________________________   EKG  None  ____________________________________________    RADIOLOGY  Right shoulder Proximal humerus fracture ____________________________________________   PROCEDURES  Procedures  ____________________________________________   INITIAL IMPRESSION / ASSESSMENT AND PLAN / ED COURSE  Pertinent labs & imaging results that were available during my care of the patient were reviewed by me and considered in my medical decision making (see chart for details).   Patient presented after a mechanical fall. Concern for right shoulder fracture/dislocation. X-ray shows a proximal humeral fracture. Will place patient in sling and have patient follow up with orthopedics. Discussed findings and plan with patient. Discussed return precautions.   ____________________________________________   FINAL CLINICAL IMPRESSION(S) / ED DIAGNOSES  Final diagnoses:  Closed fracture of proximal end of right humerus, unspecified fracture morphology, initial encounter     Note: This dictation was prepared with Dragon dictation. Any transcriptional errors that  result from this process are unintentional     Nance Pear, MD 06/14/18 [phone]

## 2018-06-14 NOTE — Discharge Instructions (Signed)
Please seek medical attention for any high fevers, chest pain, shortness of breath, change in behavior, persistent vomiting, bloody stool or any other new or concerning symptoms.  

## 2018-06-14 NOTE — ED Notes (Signed)
[facility] attempted to be called again. Pt being DC at this time.

## 2018-06-14 NOTE — ED Notes (Signed)
EDP at bedside  

## 2018-06-23 ENCOUNTER — Telehealth (INDEPENDENT_AMBULATORY_CARE_PROVIDER_SITE_OTHER): Payer: Self-pay | Admitting: *Deleted

## 2018-06-23 NOTE — Telephone Encounter (Signed)
Prescreened pt for COVID 19 for appt scheduled 06/24/2018 and pt answered NO to all questions 

## 2018-06-24 ENCOUNTER — Ambulatory Visit (INDEPENDENT_AMBULATORY_CARE_PROVIDER_SITE_OTHER): Payer: Worker's Compensation | Admitting: Orthopaedic Surgery

## 2018-06-24 ENCOUNTER — Ambulatory Visit (INDEPENDENT_AMBULATORY_CARE_PROVIDER_SITE_OTHER): Payer: Self-pay

## 2018-06-24 ENCOUNTER — Other Ambulatory Visit: Payer: Self-pay

## 2018-06-24 ENCOUNTER — Encounter (INDEPENDENT_AMBULATORY_CARE_PROVIDER_SITE_OTHER): Payer: Self-pay | Admitting: Orthopaedic Surgery

## 2018-06-24 ENCOUNTER — Telehealth (INDEPENDENT_AMBULATORY_CARE_PROVIDER_SITE_OTHER): Payer: Self-pay

## 2018-06-24 DIAGNOSIS — M25511 Pain in right shoulder: Secondary | ICD-10-CM | POA: Diagnosis not present

## 2018-06-24 DIAGNOSIS — S42201A Unspecified fracture of upper end of right humerus, initial encounter for closed fracture: Secondary | ICD-10-CM | POA: Diagnosis not present

## 2018-06-24 DIAGNOSIS — S8012XA Contusion of left lower leg, initial encounter: Secondary | ICD-10-CM

## 2018-06-24 DIAGNOSIS — M79605 Pain in left leg: Secondary | ICD-10-CM

## 2018-06-24 NOTE — Telephone Encounter (Signed)
Faxed todays office note and work note to wc adj

## 2018-06-24 NOTE — Progress Notes (Signed)
Office Visit Note   Patient: Shelly Armstrong           Date of Birth: [birth year]           MRN: [id number] Visit Date: 06/24/2018              Requested by: Kirk Ruths, MD [address], [place] [address] PCP: Kirk Ruths, MD   Assessment & Plan: Visit Diagnoses:  1. Pain in left leg   2. Acute pain of right shoulder   3. Closed fracture of proximal end of right humerus, unspecified fracture morphology, initial encounter   4. Contusion of left lower extremity, initial encounter     Plan: New sling applied.  With history of unstable angina we will proceed with nonoperative treatment and she should be able to get satisfactory healing.  We discussed some problems with reaching high overhead.  When she gets some partial healing we can start some exercises on her shoulder that she likely can do many on her own.  Return in 3 weeks repeat x-rays right shoulder on return.  Follow-Up Instructions: Return in about 3 weeks (around 07/15/2018).   Orders:  Orders Placed This Encounter  Procedures  . XR Tibia/Fibula Left  . XR Shoulder Right   No orders of the defined types were placed in this encounter.     Procedures: No procedures performed   Clinical Data: No additional findings.   Subjective: Chief Complaint  Patient presents with  . Right Shoulder - Fracture    OTJI 06/14/2018  . 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  She has some soft tissue swelling in the is ecchymosis laterally and medially in her elbow and heel.  She has been in a shoulder sling  which is a little bit short.  Date of injury was 06/14/2018 which was 10 days ago.  Review of Systems positive history of unstable angina otherwise noncontributory as pertains to HPI.   Objective: Vital Signs: There were no vitals taken for this visit.  Physical Exam Constitutional:      Appearance: She is well-developed.  HENT:     Head: Normocephalic.     Right Ear: External ear normal.     Left Ear: External ear normal.  Eyes:     Pupils: Pupils are equal, round, and reactive to light.  Neck:     Thyroid: No thyromegaly.     Trachea: No tracheal deviation.  Cardiovascular:     Rate and Rhythm: Normal rate.  Pulmonary:     Effort: Pulmonary effort is normal.  Abdominal:     Palpations: Abdomen is soft.  Skin:    General: Skin is warm and dry.  Neurological:     Mental Status: She is alert and oriented to person, place, and time.  Psychiatric:        Behavior: Behavior normal.     Ortho Exam patient has intact sensation to the hand.  Sling is little bit short and a sling was supplied and adjusted.  Patient has subcutaneous fluid collection over the left lower leg distal third over the lateral aspect of the tibia.  Anterior tib peroneals are active and intact.  Ecchymosis extended down to the foot to the soft tissue contusion.  Ankle dorsiflexion plantarflexion is intact.  Specialty Comments:  No specialty comments available.  Imaging: Xr Tibia/fibula Left  Result Date: 06/24/2018 AP lateral tib-fib x-rays are obtained and reviewed including the ankle.  This is negative for acute fracture.  There is subcutaneous soft tissue swelling noted anteriorly distal third of the leg. Impression: Soft tissue swelling consistent with contusion negative for acute bony injury.  Xr Shoulder Right  Result Date: 06/24/2018 Three-view x-rays right shoulder obtained and reviewed.  This shows slight inferior subluxation.  Comminuted proximal humerus fracture with greater tuberosity is a  separate fragment.  There is slight angulation of the fracture at about 30degrees.  No dislocation of the joint. Impression: Comminuted right proximal humerus fracture with mild angulation and some displacement greater tuberosity fragment.    PMFS History: Patient Active Problem List   Diagnosis Date Noted  . Closed fracture of right proximal humerus 06/24/2018  . Unstable angina ([place]) 12/03/2016   Past Medical History:  Diagnosis Date  . Chickenpox   . Edema    feet/legs  . Hyperglycemia   . Hyperlipidemia   . Hypertension   . Osteopenia   . PONV (postoperative nausea and vomiting)    after hand surgery    Family History  Problem Relation Age of Onset  . Heart failure Mother   . CAD Father   . Breast cancer Neg Hx     Past Surgical History:  Procedure Laterality Date  . ABDOMINAL HYSTERECTOMY    . BREAST BIOPSY Left 2001   stereotactic biopsy - negative  . BREAST SURGERY     Breast excisional biopsy  . CATARACT EXTRACTION W/PHACO Right 10/19/2014   Procedure: CATARACT EXTRACTION PHACO AND INTRAOCULAR LENS PLACEMENT (IOC);  Surgeon: Birder Robson, MD;  Location: [facility] ORS;  Service: Ophthalmology;  Laterality: Right;  casette lot# [id number] H   [place]  00:29 AP17.7 CDE 5.09  . CATARACT EXTRACTION W/PHACO Left 11/02/2014   Procedure: CATARACT EXTRACTION PHACO AND INTRAOCULAR LENS PLACEMENT (IOC);  Surgeon: Birder Robson, MD;  Location: [facility] ORS;  Service: Ophthalmology;  Laterality: Left;  [place]: 00:34.2 AP%: 22.5 CDE: 7.71  Fluid lot# [id number] H  . CHOLECYSTECTOMY    . COLONOSCOPY WITH PROPOFOL N/A 08/31/2014   Procedure: COLONOSCOPY WITH PROPOFOL;  Surgeon: Lollie Sails, MD;  [facility]COPY;  Service: Endoscopy;  Laterality: N/A;  . ctr    . ESOPHAGOGASTRODUODENOSCOPY N/A 08/31/2014   Procedure: ESOPHAGOGASTRODUODENOSCOPY (EGD);  Surgeon: Lollie Sails, MD;  Location: [facility] ENDOSCOPY;  Service: Endoscopy;  Laterality: N/A;  . FRACTURE SURGERY     Jaw fx repair;  Hand procedure after trauma  . MANDIBLE FRACTURE SURGERY    . WRIST SURGERY     CTR   Social History   Occupational History  . Not on file  Tobacco Use  . Smoking status: Never Smoker  . Smokeless tobacco: Never Used  Substance and Sexual Activity  . Alcohol use: No  . Drug use: No  . Sexual activity: Not on file

## 2018-06-24 NOTE — Telephone Encounter (Signed)
Faxed todays office note and work note to wc adj [employer] [phone]

## 2018-06-24 NOTE — Telephone Encounter (Signed)
-----   Message from Marybelle Killings, MD sent at 06/24/2018 10:25 AM EDT ----- Cc wc thx

## 2018-07-15 ENCOUNTER — Encounter (INDEPENDENT_AMBULATORY_CARE_PROVIDER_SITE_OTHER): Payer: Self-pay | Admitting: Orthopaedic Surgery

## 2018-07-15 ENCOUNTER — Other Ambulatory Visit: Payer: Self-pay

## 2018-07-15 ENCOUNTER — Ambulatory Visit (INDEPENDENT_AMBULATORY_CARE_PROVIDER_SITE_OTHER): Payer: Worker's Compensation | Admitting: Orthopaedic Surgery

## 2018-07-15 ENCOUNTER — Ambulatory Visit (INDEPENDENT_AMBULATORY_CARE_PROVIDER_SITE_OTHER): Payer: Worker's Compensation

## 2018-07-15 VITALS — Ht 63.0 in | Wt 167.0 lb

## 2018-07-15 DIAGNOSIS — M25511 Pain in right shoulder: Secondary | ICD-10-CM

## 2018-07-15 DIAGNOSIS — S42201D Unspecified fracture of upper end of right humerus, subsequent encounter for fracture with routine healing: Secondary | ICD-10-CM

## 2018-07-15 NOTE — Progress Notes (Signed)
Post-Op Visit Note   Patient: Shelly Armstrong           Date of Birth: 05/01/1945           MRN: [id number] Visit Date: 07/15/2018 PCP: Kirk Ruths, MD   Assessment & Plan: Follow-up right proximal humerus fracture date of injury 06/14/2018 which was an on-the-job injury.  Today x-rays show position is maintained.  With physical exam proximal and distal humerus is moving his 1 unit.  She can start some "rock the baby".  Elephant swings and floor cervical painting clockwise and counterclockwise.  Chief Complaint:  Chief Complaint  Patient presents with  . Right Shoulder - Fracture, Follow-up    OTJI 06/14/2018  . Left Leg - Follow-up    OTJI 06/14/2018   Visit Diagnoses:  1. Acute pain of right shoulder   2. Closed fracture of proximal end of right humerus with routine healing, unspecified fracture morphology, subsequent encounter     Plan: Work slip given no work x4 weeks and plan to recheck her in 3 weeks.  She can discontinue the waist strap on her shoulder immobilizer.  Continue sling.  She can remove the sling to work on exercises.  2 view x-rays right proximal humerus on return.  Work slip given no work x4 weeks.  Patient is not taking any pain medication currently.[employer] .   Follow-Up Instructions: Return in about 3 weeks (around 08/05/2018).   Orders:  Orders Placed This Encounter  Procedures  . XR Shoulder Right   No orders of the defined types were placed in this encounter.   Imaging: Xr Shoulder Right  Result Date: 07/15/2018 2 view x-rays right shoulder obtained and reviewed.  This shows unchanged position of proximal humerus fracture with slight angulation and minimal displacement. Impression: Right proximal humerus fracture.  Position is unchanged.   PMFS History: Patient Active Problem List   Diagnosis Date Noted  . Closed fracture of right proximal humerus 06/24/2018  . Contusion of left leg 06/24/2018  . Unstable angina ([place]) 12/03/2016   Past  Medical History:  Diagnosis Date  . Chickenpox   . Edema    feet/legs  . Hyperglycemia   . Hyperlipidemia   . Hypertension   . Osteopenia   . PONV (postoperative nausea and vomiting)    after hand surgery    Family History  Problem Relation Age of Onset  . Heart failure Mother   . CAD Father   . Breast cancer Neg Hx     Past Surgical History:  Procedure Laterality Date  . ABDOMINAL HYSTERECTOMY    . BREAST BIOPSY Left 2001   stereotactic biopsy - negative  . BREAST SURGERY     Breast excisional biopsy  . CATARACT EXTRACTION W/PHACO Right 10/19/2014   Procedure: CATARACT EXTRACTION PHACO AND INTRAOCULAR LENS PLACEMENT (IOC);  Surgeon: Birder Robson, MD;  Location: [facility] ORS;  Service: Ophthalmology;  Laterality: Right;  casette lot# [id number]   [place]  00:29 AP17.7 CDE 5.09  . CATARACT EXTRACTION W/PHACO Left 11/02/2014   Procedure: CATARACT EXTRACTION PHACO AND INTRAOCULAR LENS PLACEMENT (IOC);  Surgeon: Birder Robson, MD;  Location: [facility] ORS;  Service: Ophthalmology;  Laterality: Left;  [place]: 00:34.2 AP%: 22.5 CDE: 7.71  Fluid lot# [id number] H  . CHOLECYSTECTOMY    . COLONOSCOPY WITH PROPOFOL N/A 08/31/2014   Procedure: COLONOSCOPY WITH PROPOFOL;  Surgeon: Lollie Sails, MD;  Location: [facility] ENDOSCOPY;  Service: Endoscopy;  Laterality: N/A;  . ctr    . ESOPHAGOGASTRODUODENOSCOPY N/A  08/31/2014   Procedure: ESOPHAGOGASTRODUODENOSCOPY (EGD);  Surgeon: Lollie Sails, MD;  Location: [facility] ENDOSCOPY;  Service: Endoscopy;  Laterality: N/A;  . FRACTURE SURGERY     Jaw fx repair; Hand procedure after trauma  . MANDIBLE FRACTURE SURGERY    . WRIST SURGERY     CTR   Social History   Occupational History  . Not on file  Tobacco Use  . Smoking status: Never Smoker  . Smokeless tobacco: Never Used  Substance and Sexual Activity  . Alcohol use: No  . Drug use: No  . Sexual activity: Not on file

## 2018-07-16 ENCOUNTER — Telehealth (INDEPENDENT_AMBULATORY_CARE_PROVIDER_SITE_OTHER): Payer: Self-pay

## 2018-07-16 NOTE — Telephone Encounter (Signed)
Faxed note to wc adj Zara Council [phone]

## 2018-07-16 NOTE — Telephone Encounter (Signed)
-----   Message from Marybelle Killings, MD sent at 07/15/2018 10:24 AM EDT ----- Cc to W/C thanks

## 2018-08-05 ENCOUNTER — Encounter: Payer: Self-pay | Admitting: Orthopaedic Surgery

## 2018-08-05 ENCOUNTER — Other Ambulatory Visit: Payer: Self-pay

## 2018-08-05 ENCOUNTER — Ambulatory Visit: Payer: PRIVATE HEALTH INSURANCE

## 2018-08-05 ENCOUNTER — Telehealth: Payer: Self-pay

## 2018-08-05 ENCOUNTER — Ambulatory Visit (INDEPENDENT_AMBULATORY_CARE_PROVIDER_SITE_OTHER): Payer: PRIVATE HEALTH INSURANCE | Admitting: Orthopaedic Surgery

## 2018-08-05 VITALS — Ht 63.0 in | Wt 161.0 lb

## 2018-08-05 DIAGNOSIS — S42201D Unspecified fracture of upper end of right humerus, subsequent encounter for fracture with routine healing: Secondary | ICD-10-CM

## 2018-08-05 NOTE — Progress Notes (Signed)
Office Visit Note   Patient: Shelly Armstrong           Date of Birth: 05-Sep-1945           MRN: [id number] Visit Date: 08/05/2018              Requested by: Kirk Ruths, MD [address], [place] [address] PCP: Kirk Ruths, MD   Assessment & Plan: Visit Diagnoses:  1. Closed fracture of proximal end of right humerus with routine healing, unspecified fracture morphology, subsequent encounter     Plan: Follow-up proximal humerus fracture 06/14/2018.  X-rays show satisfactory position and alignment.  Her humerus is moving his 1 unit.  She can progress with some wall walking with her fingers.  We discussed using a stick in supine position.  She will work on wall walking with her fingertips use her opposite arm for assistance.  Recheck 3 weeks no x-ray needed on return visit.  She will call if she feels like she needs formal physical therapy but she is made good progress and should be able to do her exercises on her own.  Work slip given no work x1 month.  I will recheck her in 3 weeks.  This was an on-the-job injury.  I do not think at this point she will require formal physical therapy.  Will consider work resumption selection date when she returns in 3 weeks.  Follow-Up Instructions: Return in about 3 weeks (around 08/26/2018).   Orders:  Orders Placed This Encounter  Procedures  . XR Shoulder Right   No orders of the defined types were placed in this encounter.     Procedures: No procedures performed   Clinical Data: No additional findings.   Subjective: Chief Complaint  Patient presents with  . Right Shoulder - Fracture, Follow-up    OTJI 06/14/2018    HPI fall approximately humerus fracture, right.  No neurologic deficit.  Review of Systems updated unchanged.   Objective: Vital Signs: Ht 5\' 3"  (1.6 m)   Wt 161 lb (73 kg)   BMI 28.52 kg/m   Physical Exam swelling is down.  Proximal and distal humerus move  his 1 unit.  She is able to move her arm 3040 degrees flexion extension without pain and no pain with rotation.  Ortho Exam hand is neurovascularly intact.  Specialty Comments:  No specialty comments available.  Imaging: No results found.   PMFS History: Patient Active Problem List   Diagnosis Date Noted  . Closed fracture of right proximal humerus 06/24/2018  . Contusion of left leg 06/24/2018  . Unstable angina ([place]) 12/03/2016   Past Medical History:  Diagnosis Date  . Chickenpox   . Edema    feet/legs  . Hyperglycemia   . Hyperlipidemia   . Hypertension   . Osteopenia   . PONV (postoperative nausea and vomiting)    after hand surgery    Family History  Problem Relation Age of Onset  . Heart failure Mother   . CAD Father   . Breast cancer Neg Hx     Past Surgical History:  Procedure Laterality Date  . ABDOMINAL HYSTERECTOMY    . BREAST BIOPSY Left 2001   stereotactic biopsy - negative  . BREAST SURGERY     Breast excisional biopsy  . CATARACT EXTRACTION W/PHACO Right 10/19/2014   Procedure: CATARACT EXTRACTION PHACO AND INTRAOCULAR LENS PLACEMENT (IOC);  Surgeon: Birder Robson, MD;  Location: [facility] ORS;  Service: Ophthalmology;  Laterality: Right;  casette lot# [id number] H   [place]  00:29 AP17.7 CDE 5.09  . CATARACT EXTRACTION W/PHACO Left 11/02/2014   Procedure: CATARACT EXTRACTION PHACO AND INTRAOCULAR LENS PLACEMENT (IOC);  Surgeon: Birder Robson, MD;  Location: [facility] ORS;  Service: Ophthalmology;  Laterality: Left;  [place]: 00:34.2 AP%: 22.5 CDE: 7.71  Fluid lot# [id number] H  . CHOLECYSTECTOMY    . COLONOSCOPY WITH PROPOFOL N/A 08/31/2014   Procedure: COLONOSCOPY WITH PROPOFOL;  Surgeon: Lollie Sails, MD;  Location: [facility] ENDOSCOPY;  Service: Endoscopy;  Laterality: N/A;  . ctr    . ESOPHAGOGASTRODUODENOSCOPY N/A 08/31/2014   Procedure: ESOPHAGOGASTRODUODENOSCOPY (EGD);  Surgeon: Lollie Sails, MD;  Location: [facility] ENDOSCOPY;  Service: Endoscopy;  Laterality: N/A;   . FRACTURE SURGERY     Jaw fx repair; Hand procedure after trauma  . MANDIBLE FRACTURE SURGERY    . WRIST SURGERY     CTR   Social History   Occupational History  . Not on file  Tobacco Use  . Smoking status: Never Smoker  . Smokeless tobacco: Never Used  Substance and Sexual Activity  . Alcohol use: No  . Drug use: No  . Sexual activity: Not on file

## 2018-08-05 NOTE — Telephone Encounter (Signed)
Faxed office note to Zara Council with [employer] work comp [phone]

## 2018-08-05 NOTE — Telephone Encounter (Signed)
-----   Message from Marybelle Killings, MD sent at 08/05/2018 10:33 AM EDT ----- WC thx

## 2018-08-07 ENCOUNTER — Telehealth: Payer: Self-pay | Admitting: Orthopaedic Surgery

## 2018-08-07 NOTE — Telephone Encounter (Signed)
Please advise 

## 2018-08-07 NOTE — Telephone Encounter (Signed)
Pt wants to know if she can continue day to day activities without sling. Or should she wear it occasionally ?

## 2018-08-07 NOTE — Telephone Encounter (Signed)
I called patient and advised. 

## 2018-08-07 NOTE — Telephone Encounter (Signed)
Ok to remove it .  thanks

## 2018-08-08 ENCOUNTER — Encounter: Payer: Self-pay | Admitting: Orthopaedic Surgery

## 2018-08-26 ENCOUNTER — Other Ambulatory Visit: Payer: Self-pay

## 2018-08-26 ENCOUNTER — Encounter: Payer: Self-pay | Admitting: Orthopaedic Surgery

## 2018-08-26 ENCOUNTER — Ambulatory Visit (INDEPENDENT_AMBULATORY_CARE_PROVIDER_SITE_OTHER): Payer: PRIVATE HEALTH INSURANCE | Admitting: Orthopaedic Surgery

## 2018-08-26 VITALS — Ht 63.0 in | Wt 161.0 lb

## 2018-08-26 DIAGNOSIS — S42201D Unspecified fracture of upper end of right humerus, subsequent encounter for fracture with routine healing: Secondary | ICD-10-CM

## 2018-08-26 NOTE — Progress Notes (Signed)
Office Visit Note   Patient: Shelly Armstrong           Date of Birth: [birth year]           MRN: [id number] Visit Date: 08/26/2018              Requested by: Kirk Ruths, MD [address], [place] [address] PCP: Kirk Ruths, MD   Assessment & Plan: Visit Diagnoses:  1. Closed fracture of proximal end of right humerus with routine healing, unspecified fracture morphology, subsequent encounter     Plan: Work slip given for work resumption on 08/30/2018.  I will see her back in 1 month for impairment rating.  Should continue use the pulley work on stretching.  She can reach the top of her head but cannot hold a blow dryer up to blow dry her hair.  Follow-Up Instructions: Return in about 1 month (around 09/25/2018).   Orders:  No orders of the defined types were placed in this encounter.  No orders of the defined types were placed in this encounter.     Procedures: No procedures performed   Clinical Data: No additional findings.   Subjective: Chief Complaint  Patient presents with  . Right Shoulder - Fracture, Follow-up    OTJI 06/14/2018    HPI follow-up right proximal humerus fracture on-the-job injury 06/14/2018.  She has no problems using her hand down low.  She can reach her hand the top of her head was not able to use a hair dryer to dry her hair.  She still using the pulley.  When she lays down she can get her arms up overhead using the stick.  Review of Systems reviewed updated and unchanged from previous office visit.   Objective: Vital Signs: Ht 5\' 3"  (1.6 m)   Wt 161 lb (73 kg)   BMI 28.52 kg/m   Physical Exam Constitutional:      Appearance: She is well-developed.  HENT:     Head: Normocephalic.     Right Ear: External ear normal.     Left Ear: External ear normal.  Eyes:     Pupils: Pupils are equal, round, and reactive to light.  Neck:     Thyroid: No thyromegaly.     Trachea: No tracheal  deviation.  Cardiovascular:     Rate and Rhythm: Normal rate.  Pulmonary:     Effort: Pulmonary effort is normal.  Abdominal:     Palpations: Abdomen is soft.  Skin:    General: Skin is warm and dry.  Neurological:     Mental Status: She is alert and oriented to person, place, and time.  Psychiatric:        Behavior: Behavior normal.     Ortho Exam proximal distal humerus move his 1 unit.  She can reach her mouth top of her head with her right hand.  Limitation of internal rotation and posterior axillary line.  Specialty Comments:  No specialty comments available.  Imaging: No results found.   PMFS History: Patient Active Problem List   Diagnosis Date Noted  . Closed fracture of right proximal humerus 06/24/2018  . Contusion of left leg 06/24/2018  . Unstable angina ([place]) 12/03/2016   Past Medical History:  Diagnosis Date  . Chickenpox   . Edema    feet/legs  . Hyperglycemia   . Hyperlipidemia   . Hypertension   . Osteopenia   . PONV (postoperative nausea and vomiting)  after hand surgery    Family History  Problem Relation Age of Onset  . Heart failure Mother   . CAD Father   . Breast cancer Neg Hx     Past Surgical History:  Procedure Laterality Date  . ABDOMINAL HYSTERECTOMY    . BREAST BIOPSY Left 2001   stereotactic biopsy - negative  . BREAST SURGERY     Breast excisional biopsy  . CATARACT EXTRACTION W/PHACO Right 10/19/2014   Procedure: CATARACT EXTRACTION PHACO AND INTRAOCULAR LENS PLACEMENT (IOC);  Surgeon: Birder Robson, MD;  Location: [facility] ORS;  Service: Ophthalmology;  Laterality: Right;  casette lot# [id number]   [place]  00:29 AP17.7 CDE 5.09  . CATARACT EXTRACTION W/PHACO Left 11/02/2014   Procedure: CATARACT EXTRACTION PHACO AND INTRAOCULAR LENS PLACEMENT (IOC);  Surgeon: Birder Robson, MD;  Location: [facility] ORS;  Service: Ophthalmology;  Laterality: Left;  [place]: 00:34.2 AP%: 22.5 CDE: 7.71  Fluid lot# [id number] H  . CHOLECYSTECTOMY    .  COLONOSCOPY WITH PROPOFOL N/A 08/31/2014   Procedure: COLONOSCOPY WITH PROPOFOL;  Surgeon: Lollie Sails, MD;  Location: [facility] ENDOSCOPY;  Service: Endoscopy;  Laterality: N/A;  . ctr    . ESOPHAGOGASTRODUODENOSCOPY N/A 08/31/2014   Procedure: ESOPHAGOGASTRODUODENOSCOPY (EGD);  Surgeon: Lollie Sails, MD;  Location: [facility] ENDOSCOPY;  Service: Endoscopy;  Laterality: N/A;  . FRACTURE SURGERY     Jaw fx repair; Hand procedure after trauma  . MANDIBLE FRACTURE SURGERY    . WRIST SURGERY     CTR   Social History   Occupational History  . Not on file  Tobacco Use  . Smoking status: Never Smoker  . Smokeless tobacco: Never Used  Substance and Sexual Activity  . Alcohol use: No  . Drug use: No  . Sexual activity: Not on file

## 2018-08-29 ENCOUNTER — Telehealth: Payer: Self-pay

## 2018-08-29 NOTE — Telephone Encounter (Signed)
-----   Message from Marybelle Killings, MD sent at 08/26/2018 10:43 AM EDT ----- Cc W/C

## 2018-08-29 NOTE — Telephone Encounter (Signed)
Faxed office note and work note to [employer] with [employer]

## 2018-09-22 IMAGING — CR DG CHEST 2V
2 series · 2 of 2 positions shown · non-contrast
Comparison: None.

CLINICAL DATA: Shortness of breath and chest tightness.  Nausea.

EXAM:
CHEST  2 VIEW

[chest pa]
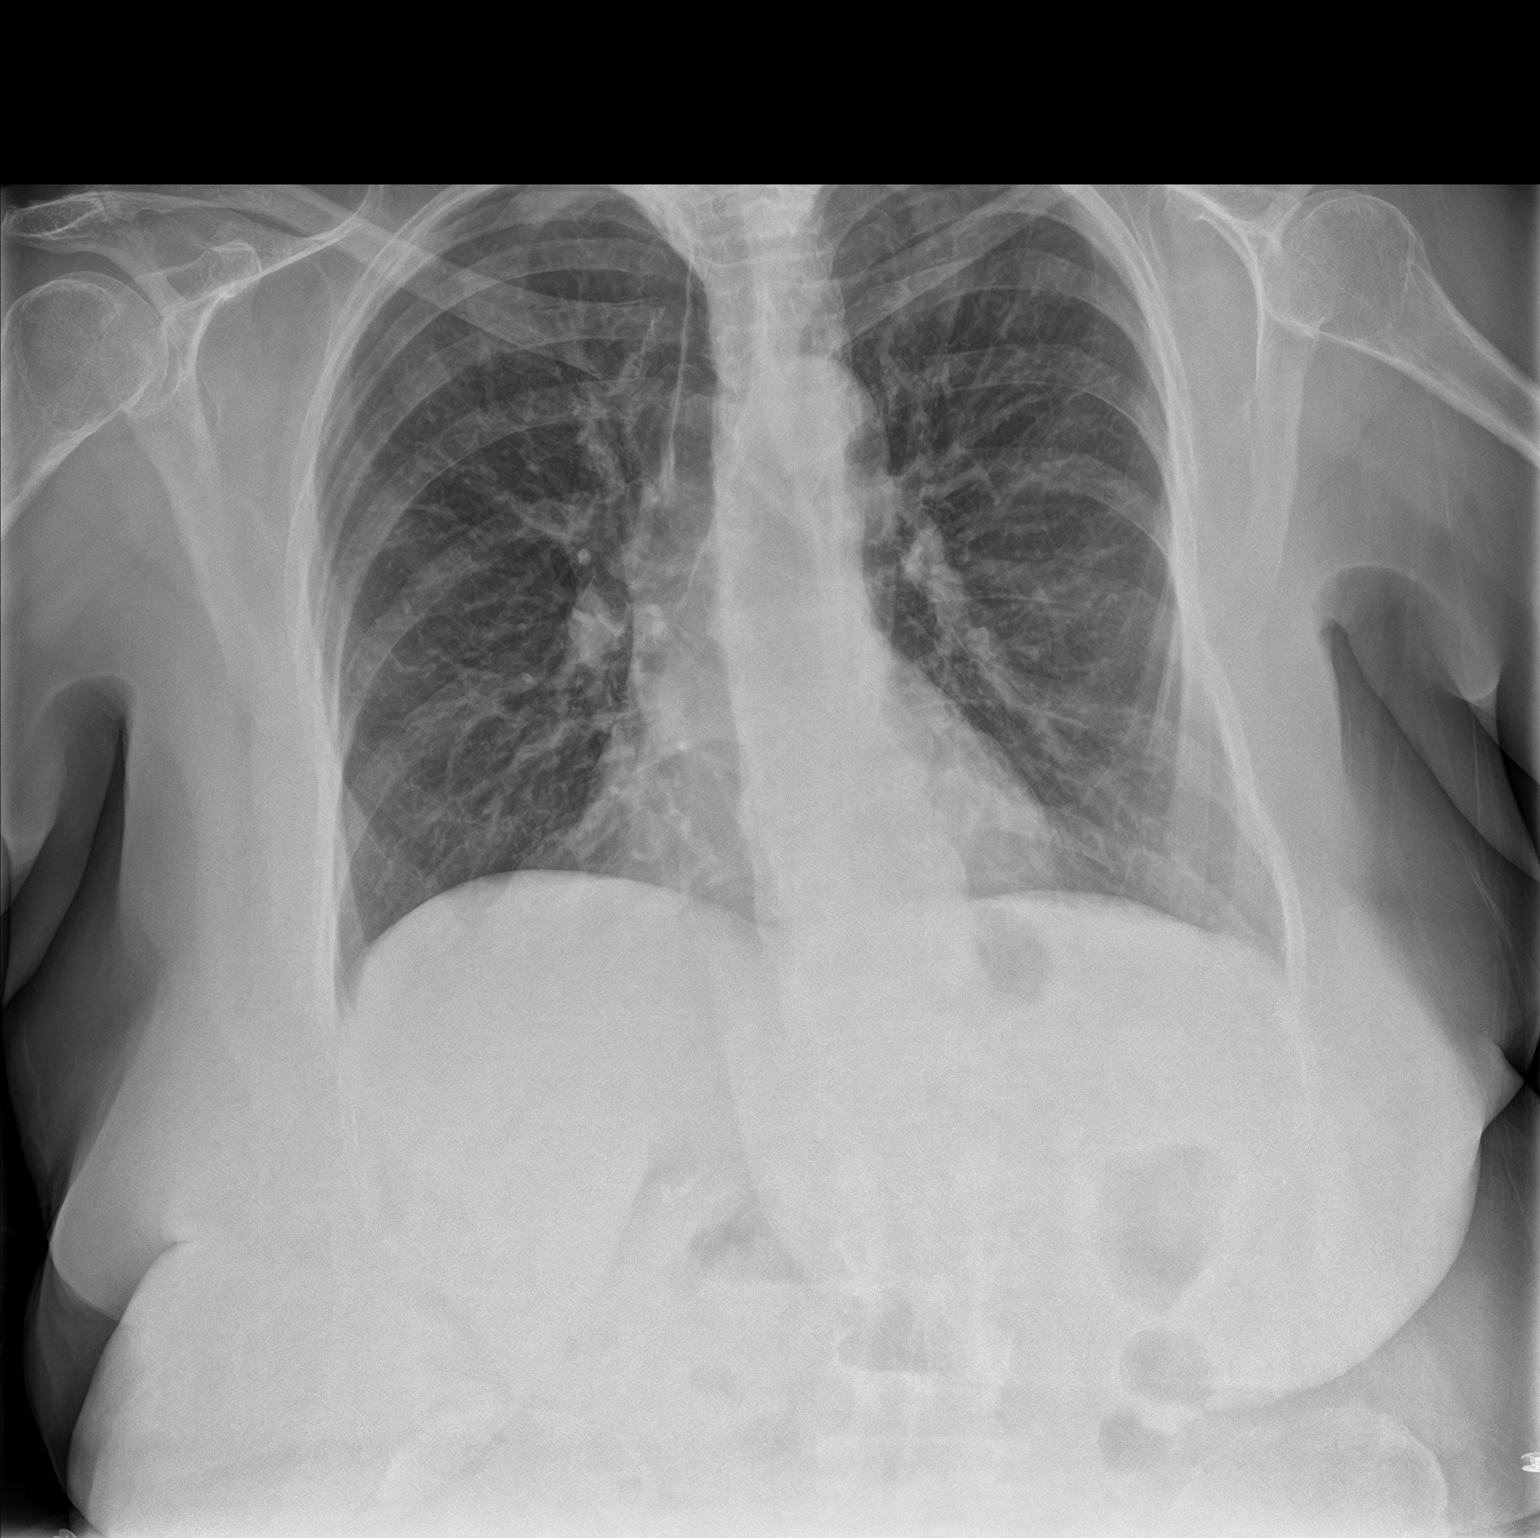

[chest lat]
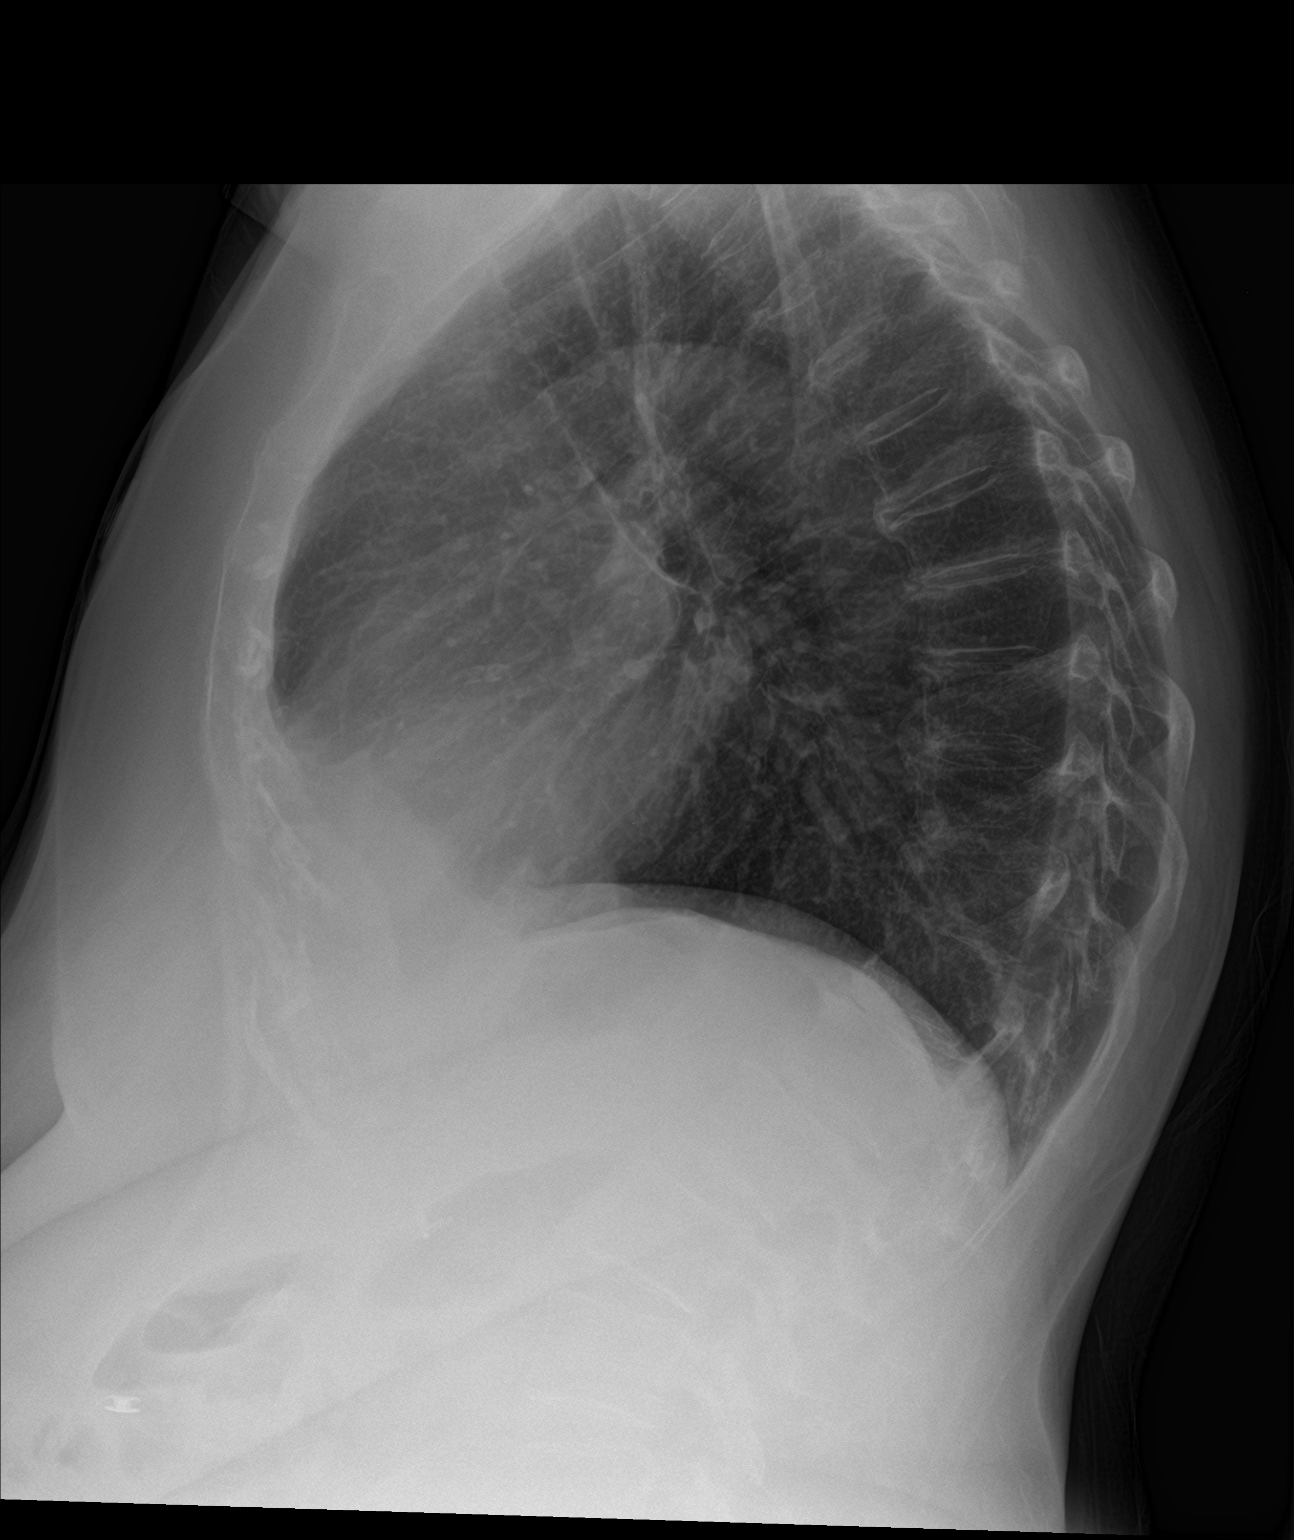

[2 of 2 positions shown; findings below may reference images not displayed]

FINDINGS: There is mild scarring in the upper lobes. There is no edema or
consolidation. The heart size and pulmonary vascularity are normal.
No adenopathy. There is degenerative change in the thoracic spine.
There is thoracolumbar levoscoliosis.
IMPRESSION: Mild upper lobe scarring bilaterally.  No edema or consolidation.

## 2018-09-22 IMAGING — CT CT ANGIO CHEST-ABD-PELV FOR DISSECTION W/ AND WO/W CM
2 of 7 series · 14 of 46 positions shown, 16 images · IV contrast (APPLIED)
Comparison: None.

CLINICAL DATA: Chest and back pain, acute. Aortic dissection is
suspected.

EXAM:
CT ANGIOGRAPHY CHEST, ABDOMEN AND PELVIS
TECHNIQUE: Multidetector CT imaging through the chest, abdomen and pelvis was
performed using the standard protocol during bolus administration of
intravenous contrast. Multiplanar reconstructed images and MIPs were
obtained and reviewed to evaluate the vascular anatomy.
CONTRAST:  100 cc Isovue 370 intravenous

[Series 5: axial arterial · axial · arterial · 0.81mm/px · z∈[-574,-61]mm · 11 of 199 slices shown, 13 images]
[im 14/199  soft-tissue]
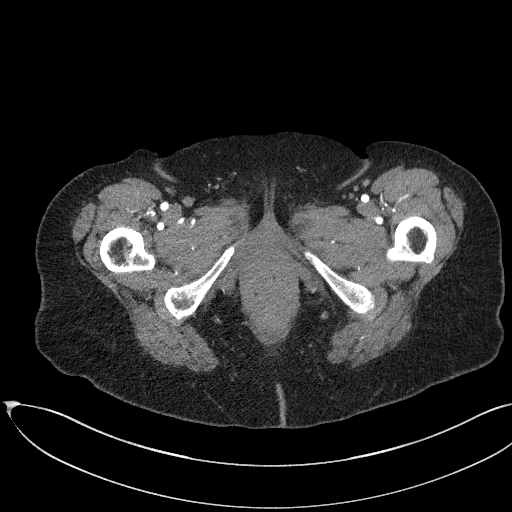
[im 14/199  bone]
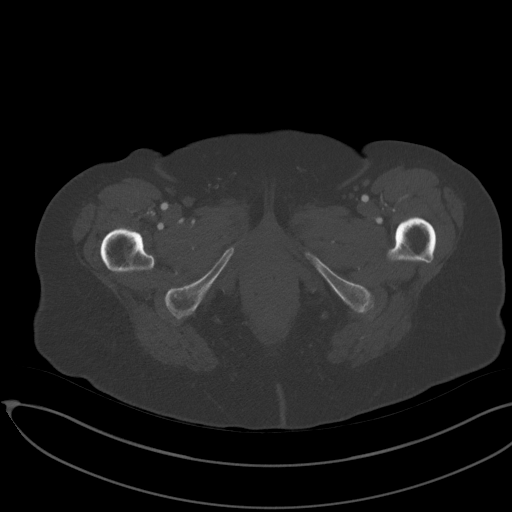
[im 27/199  soft-tissue]
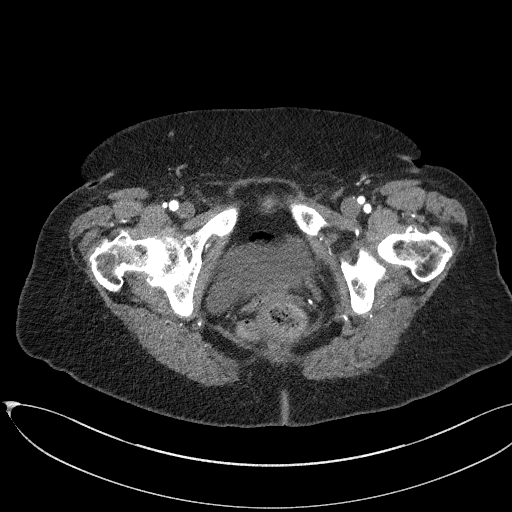
[im 53/199  soft-tissue]
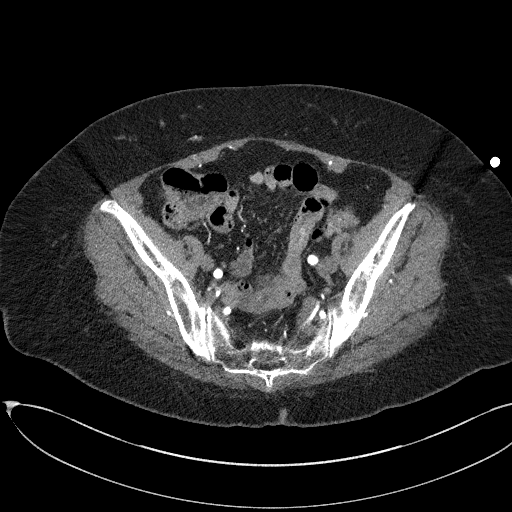
[im 67/199  soft-tissue]
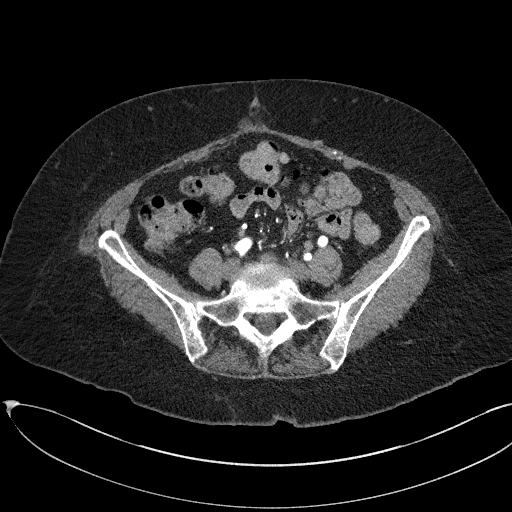
[im 80/199  soft-tissue]
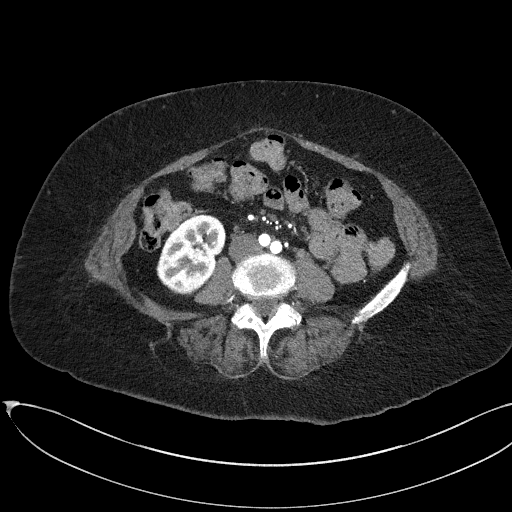
[im 106/199  soft-tissue]
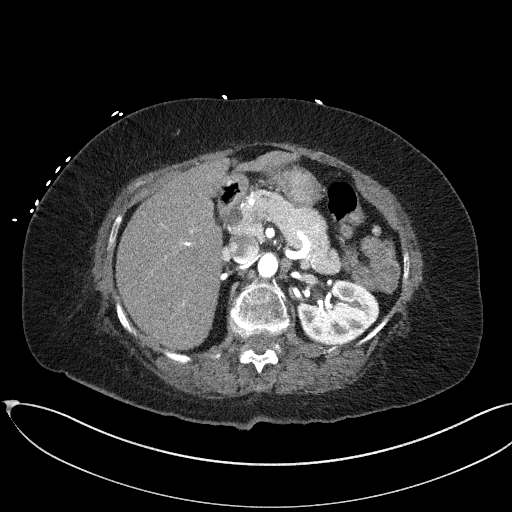
[im 119/199  soft-tissue]
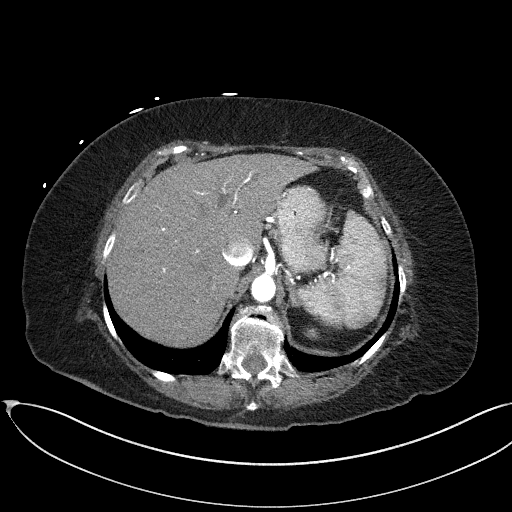
[im 133/199  soft-tissue]
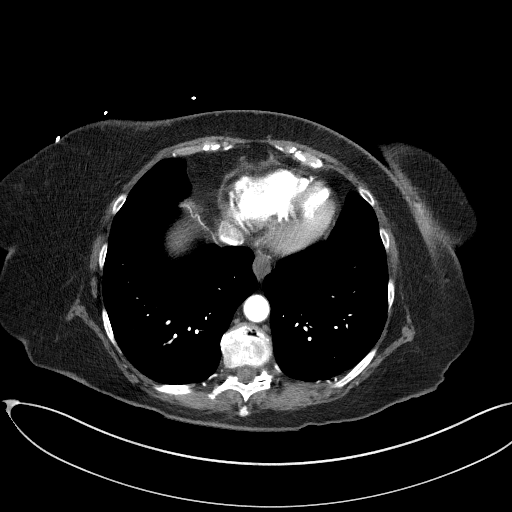
[im 146/199  soft-tissue]
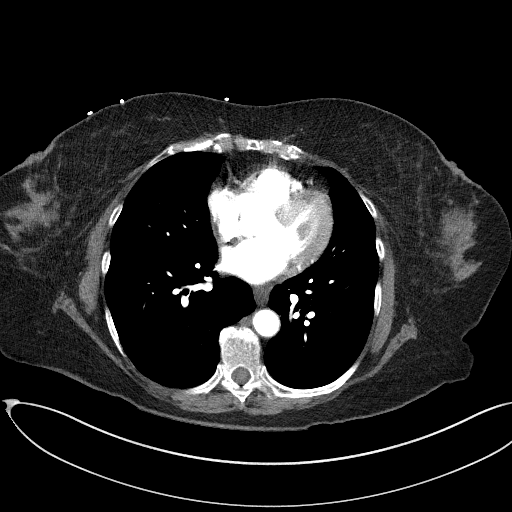
[im 146/199  bone]
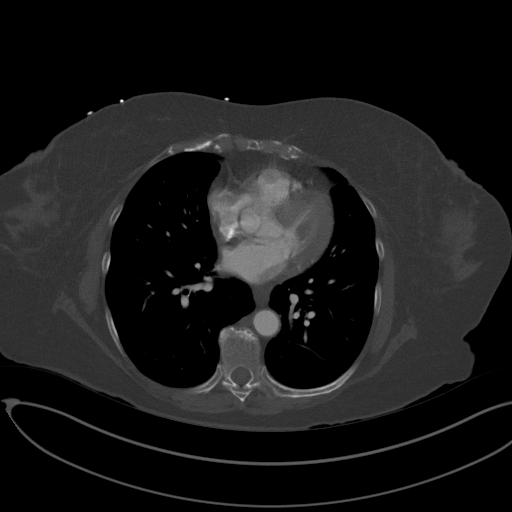
[im 172/199  soft-tissue]
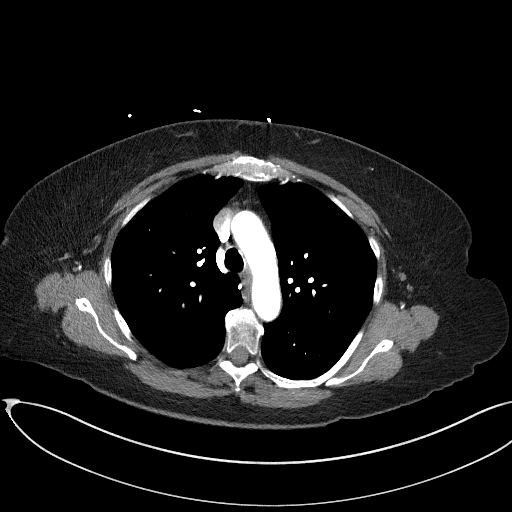
[im 185/199  soft-tissue]
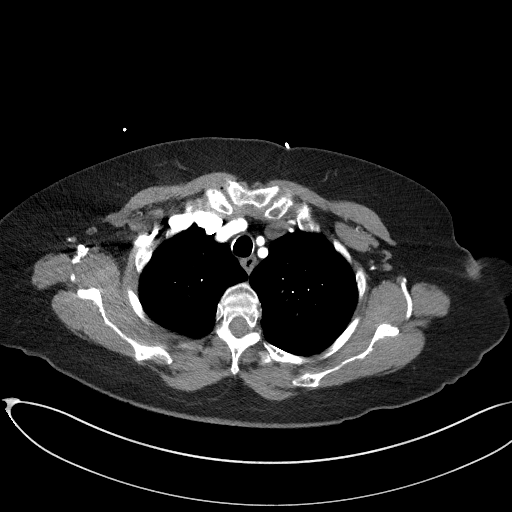

[Series 8: coronals · coronal · 0.76mm/px · 3 of 134 slices shown]
[im 34/134  soft-tissue]
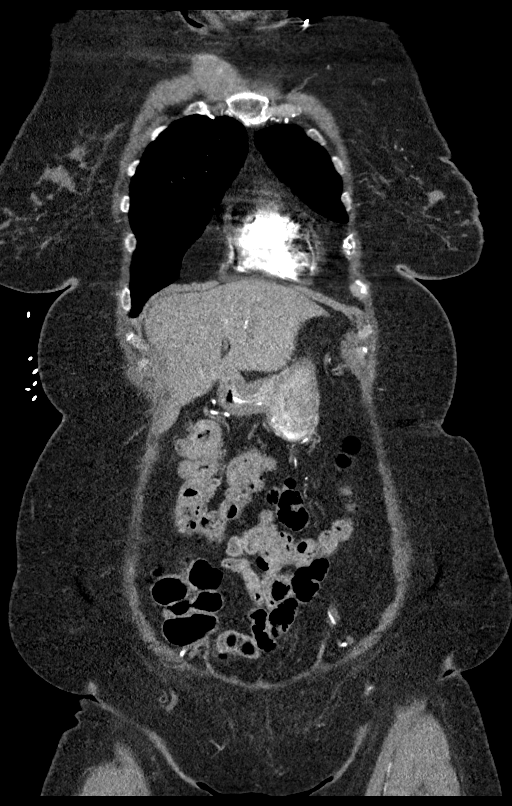
[im 67/134  soft-tissue]
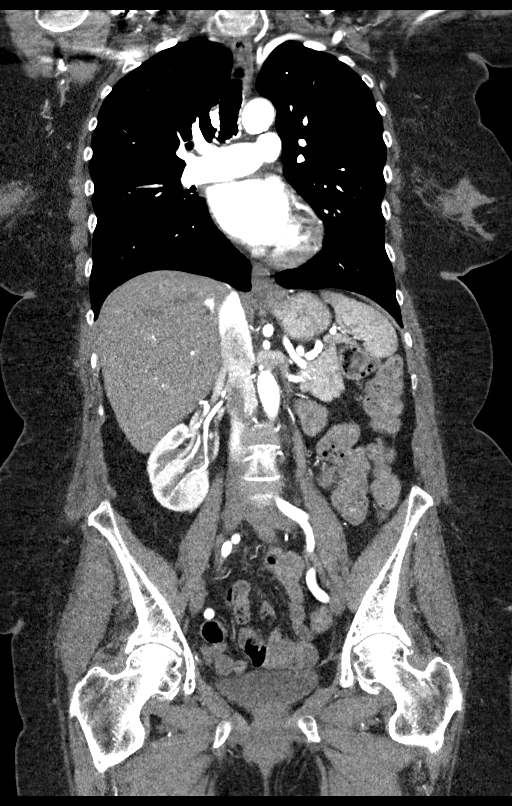
[im 100/134  soft-tissue]
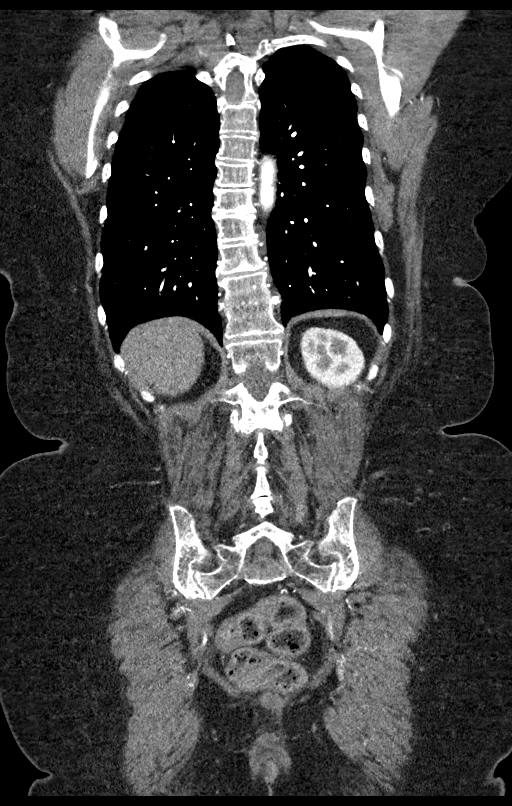

[14 of 46 positions shown; findings below may reference images not displayed]

FINDINGS: CTA CHEST FINDINGS

Cardiovascular: Noncontrast phase shows no evidence of intramural
hematoma. Preferential opacification of the thoracic aorta. No
evidence of thoracic aortic aneurysm or dissection. Normal heart
size. No pericardial effusion. There is atherosclerotic
calcification of the aorta and coronaries. No evidence of pulmonary
embolism.

Mediastinum/Nodes: Negative for adenopathy or mass.

Lungs/Pleura: There is no edema, consolidation, effusion, or
pneumothorax.

Musculoskeletal: No acute or aggressive finding. Thoracic disc
degeneration spurring.

Review of the MIP images confirms the above findings.

CTA ABDOMEN AND PELVIS FINDINGS

VASCULAR

Aorta: Negative for aneurysm or dissection. Mild atheromatous
changes.

Celiac: Atherosclerosis at the ostium without stenosis or beading.
Tortuosity of the splenic artery and to a lesser extent the common
hepatic artery.

SMA: Smooth and widely patent vessels.

Renals: Atheromatous plaque at the renal artery origins without
stenosis. Negative for beading or aneurysm. There is a small
accessory right inferior pole extensor renal artery.

IMA: Patent

Inflow: Tortuosity without stenosis or dissection. No aneurysm or
atheromatous changes.

Veins: Unremarkable in the arterial phase.

Review of the MIP images confirms the above findings.

NON-VASCULAR

Hepatobiliary: No focal liver abnormality.Cholecystectomy which may
account for the dilated common bile duct which measures 11 mm in
diameter, mildly progressed since 7121. No evidence of
choledocholithiasis.

Pancreas: Unremarkable.

Spleen: Unremarkable.

Adrenals/Urinary Tract: Negative adrenals. No hydronephrosis or
stone. Unremarkable bladder.

Stomach/Bowel: No obstruction. Moderate colonic diverticulosis, most
heavily affecting the distal colon. Prominent mucosal density of the
stomach, but generalized and without masslike feature.

Lymphatic:   No mass or adenopathy.

Reproductive:Hysterectomy.  Negative adnexae.

Other: No ascites or pneumoperitoneum.

Musculoskeletal: Degenerative changes without acute or aggressive
finding.

Review of the MIP images confirms the above findings.
IMPRESSION: 1. Negative for aortic dissection or aneurysm.  No acute finding.
2.  Aortic Atherosclerosis ([id number].C).  Coronary atherosclerosis.
3. Dilated common bile duct which could be related to previous
cholecystectomy. Please correlate with biliary labs.

## 2018-09-23 DIAGNOSIS — M76821 Posterior tibial tendinitis, right leg: Secondary | ICD-10-CM | POA: Diagnosis not present

## 2018-09-23 DIAGNOSIS — M722 Plantar fascial fibromatosis: Secondary | ICD-10-CM | POA: Diagnosis not present

## 2018-10-01 ENCOUNTER — Ambulatory Visit (INDEPENDENT_AMBULATORY_CARE_PROVIDER_SITE_OTHER): Payer: PRIVATE HEALTH INSURANCE | Admitting: Orthopaedic Surgery

## 2018-10-01 ENCOUNTER — Encounter: Payer: Self-pay | Admitting: Orthopaedic Surgery

## 2018-10-01 ENCOUNTER — Other Ambulatory Visit: Payer: Self-pay

## 2018-10-01 VITALS — Ht 63.0 in | Wt 161.0 lb

## 2018-10-01 DIAGNOSIS — S42201D Unspecified fracture of upper end of right humerus, subsequent encounter for fracture with routine healing: Secondary | ICD-10-CM | POA: Diagnosis not present

## 2018-10-01 NOTE — Progress Notes (Signed)
Office Visit Note   Patient: Shelly Armstrong           Date of Birth: September 12, 1945           MRN: [id number] Visit Date: 10/01/2018              Requested by: Kirk Ruths, MD [address],  [place] [address] PCP: Kirk Ruths, MD   Assessment & Plan: Visit Diagnoses:  1. Closed fracture of proximal end of right humerus with routine healing, unspecified fracture morphology, subsequent encounter     Plan: Based on the angulation of the proximal humerus which is healed her limitation of internal rotation with hand to posterior axillary line difficulty reaching up overhead rate or impairment at 15% of the right arm.  Patient is at [facility] office follow-up  PRN.  She is back at work and is happy with the result of treatment.  She will call if she has any problems.  Follow-Up Instructions: Return if symptoms worsen or fail to improve.   Orders:  No orders of the defined types were placed in this encounter.  No orders of the defined types were placed in this encounter.     Procedures: No procedures performed   Clinical Data: No additional findings.   Subjective: Chief Complaint  Patient presents with  . Right Shoulder - Follow-up    OTJI 06/14/2018    HPI 73 year old female returns she is back at work at [employer] in [facility].  She returns for impairment rating of her right proximal humerus fracture which has some angulation most notable on AP x-ray.  No surgery was performed and she is healed nonoperatively.  She has problems drying her hair she can get her hand just above the top of her head.  She still has weakness with outstretch and overhead activities.  Using her arm down low in front of her with light objects she functions very well.  She is used some Tylenol occasionally for the discomfort.  Review of Systems review of systems updated unchanged from previous office visits since March 2020.   Objective:  Vital Signs: Ht 5\' 3"  (1.6 m)   Wt 161 lb (73 kg)   BMI 28.52 kg/m   Physical Exam Constitutional:      Appearance: She is well-developed.  HENT:     Head: Normocephalic.     Right Ear: External ear normal.     Left Ear: External ear normal.  Eyes:     Pupils: Pupils are equal, round, and reactive to light.  Neck:     Thyroid: No thyromegaly.     Trachea: No tracheal deviation.  Cardiovascular:     Rate and Rhythm: Normal rate.  Pulmonary:     Effort: Pulmonary effort is normal.  Abdominal:     Palpations: Abdomen is soft.  Skin:    General: Skin is warm and dry.  Neurological:     Mental Status: She is alert and oriented to person, place, and time.  Psychiatric:        Behavior: Behavior normal.     Ortho Exam's extra hand is intact good grip strength.  Elbow range of motion is full right and left.  She can get her right hand just above her head level with the elbow flexed.  Passively I can get another 25degrees.  She can internally rotate with hand to posterior axillary line only opposite hand can reach T10.  She  can reach to across to her opposite left shoulder with her right hand.  Specialty Comments:  No specialty comments available.  Imaging: No results found.   PMFS History: Patient Active Problem List   Diagnosis Date Noted  . Closed fracture of right proximal humerus 06/24/2018  . Contusion of left leg 06/24/2018  . Unstable angina ([place]) 12/03/2016   Past Medical History:  Diagnosis Date  . Chickenpox   . Edema    feet/legs  . Hyperglycemia   . Hyperlipidemia   . Hypertension   . Osteopenia   . PONV (postoperative nausea and vomiting)    after hand surgery    Family History  Problem Relation Age of Onset  . Heart failure Mother   . CAD Father   . Breast cancer Neg Hx     Past Surgical History:  Procedure Laterality Date  . ABDOMINAL HYSTERECTOMY    . BREAST BIOPSY Left 2001   stereotactic biopsy - negative  . BREAST SURGERY     Breast  excisional biopsy  . CATARACT EXTRACTION W/PHACO Right 10/19/2014   Procedure: CATARACT EXTRACTION PHACO AND INTRAOCULAR LENS PLACEMENT (IOC);  Surgeon: Birder Robson, MD;  Location: [facility] ORS;  Service: Ophthalmology;  Laterality: Right;  casette lot# [id number] H   [place]  00:29 AP17.7 CDE 5.09  . CATARACT EXTRACTION W/PHACO Left 11/02/2014   Procedure: CATARACT EXTRACTION PHACO AND INTRAOCULAR LENS PLACEMENT (IOC);  Surgeon: Birder Robson, MD;  Location: [facility] ORS;  Service: Ophthalmology;  Laterality: Left;  [place]: 00:34.2 AP%: 22.5 CDE: 7.71  Fluid lot# [id number] H  . CHOLECYSTECTOMY    . COLONOSCOPY WITH PROPOFOL N/A 08/31/2014   Procedure: COLONOSCOPY WITH PROPOFOL;  Surgeon: Lollie Sails, MD;  Location: [facility] ENDOSCOPY;  Service: Endoscopy;  Laterality: N/A;  . ctr    . ESOPHAGOGASTRODUODENOSCOPY N/A 08/31/2014   Procedure: ESOPHAGOGASTRODUODENOSCOPY (EGD);  Surgeon: Lollie Sails, MD;  Location: [facility] ENDOSCOPY;  Service: Endoscopy;  Laterality: N/A;  . FRACTURE SURGERY     Jaw fx repair; Hand procedure after trauma  . MANDIBLE FRACTURE SURGERY    . WRIST SURGERY     CTR   Social History   Occupational History  . Not on file  Tobacco Use  . Smoking status: Never Smoker  . Smokeless tobacco: Never Used  Substance and Sexual Activity  . Alcohol use: No  . Drug use: No  . Sexual activity: Not on file

## 2018-10-22 DIAGNOSIS — M8588 Other specified disorders of bone density and structure, other site: Secondary | ICD-10-CM | POA: Diagnosis not present

## 2018-10-30 DIAGNOSIS — R739 Hyperglycemia, unspecified: Secondary | ICD-10-CM | POA: Diagnosis not present

## 2018-10-30 DIAGNOSIS — I10 Essential (primary) hypertension: Secondary | ICD-10-CM | POA: Diagnosis not present

## 2018-10-30 DIAGNOSIS — E782 Mixed hyperlipidemia: Secondary | ICD-10-CM | POA: Diagnosis not present

## 2018-11-06 DIAGNOSIS — M8580 Other specified disorders of bone density and structure, unspecified site: Secondary | ICD-10-CM | POA: Diagnosis not present

## 2018-11-06 DIAGNOSIS — I10 Essential (primary) hypertension: Secondary | ICD-10-CM | POA: Diagnosis not present

## 2018-11-06 DIAGNOSIS — R739 Hyperglycemia, unspecified: Secondary | ICD-10-CM | POA: Diagnosis not present

## 2018-11-06 DIAGNOSIS — E782 Mixed hyperlipidemia: Secondary | ICD-10-CM | POA: Diagnosis not present

## 2019-01-08 DIAGNOSIS — M722 Plantar fascial fibromatosis: Secondary | ICD-10-CM | POA: Diagnosis not present

## 2019-01-08 DIAGNOSIS — D2371 Other benign neoplasm of skin of right lower limb, including hip: Secondary | ICD-10-CM | POA: Diagnosis not present

## 2019-01-08 DIAGNOSIS — M79671 Pain in right foot: Secondary | ICD-10-CM | POA: Diagnosis not present

## 2019-01-27 DIAGNOSIS — M79671 Pain in right foot: Secondary | ICD-10-CM | POA: Diagnosis not present

## 2019-01-27 DIAGNOSIS — D2371 Other benign neoplasm of skin of right lower limb, including hip: Secondary | ICD-10-CM | POA: Diagnosis not present

## 2019-04-13 ENCOUNTER — Other Ambulatory Visit: Payer: Self-pay | Admitting: Internal Medicine

## 2019-04-13 DIAGNOSIS — Z1231 Encounter for screening mammogram for malignant neoplasm of breast: Secondary | ICD-10-CM

## 2019-04-22 ENCOUNTER — Ambulatory Visit
Admission: RE | Admit: 2019-04-22 | Discharge: 2019-04-22 | Disposition: A | Discharge status: Home / Self Care | Payer: PPO | Source: Ambulatory Visit | Attending: Internal Medicine | Admitting: Internal Medicine

## 2019-04-22 DIAGNOSIS — Z1231 Encounter for screening mammogram for malignant neoplasm of breast: Secondary | ICD-10-CM | POA: Insufficient documentation

## 2019-04-30 DIAGNOSIS — R1084 Generalized abdominal pain: Secondary | ICD-10-CM | POA: Diagnosis not present

## 2019-04-30 DIAGNOSIS — R6889 Other general symptoms and signs: Secondary | ICD-10-CM | POA: Diagnosis not present

## 2019-05-06 DIAGNOSIS — I10 Essential (primary) hypertension: Secondary | ICD-10-CM | POA: Diagnosis not present

## 2019-05-06 DIAGNOSIS — R739 Hyperglycemia, unspecified: Secondary | ICD-10-CM | POA: Diagnosis not present

## 2019-05-06 DIAGNOSIS — E782 Mixed hyperlipidemia: Secondary | ICD-10-CM | POA: Diagnosis not present

## 2019-05-07 DIAGNOSIS — K2900 Acute gastritis without bleeding: Secondary | ICD-10-CM | POA: Diagnosis not present

## 2019-05-13 DIAGNOSIS — I10 Essential (primary) hypertension: Secondary | ICD-10-CM | POA: Diagnosis not present

## 2019-05-13 DIAGNOSIS — E782 Mixed hyperlipidemia: Secondary | ICD-10-CM | POA: Diagnosis not present

## 2019-05-13 DIAGNOSIS — Z0000 Encounter for general adult medical examination without abnormal findings: Secondary | ICD-10-CM | POA: Diagnosis not present

## 2019-05-13 DIAGNOSIS — R739 Hyperglycemia, unspecified: Secondary | ICD-10-CM | POA: Diagnosis not present

## 2019-05-20 DIAGNOSIS — E782 Mixed hyperlipidemia: Secondary | ICD-10-CM | POA: Diagnosis not present

## 2019-05-20 DIAGNOSIS — R739 Hyperglycemia, unspecified: Secondary | ICD-10-CM | POA: Diagnosis not present

## 2019-05-20 DIAGNOSIS — I10 Essential (primary) hypertension: Secondary | ICD-10-CM | POA: Diagnosis not present

## 2019-06-11 ENCOUNTER — Other Ambulatory Visit: Payer: Self-pay

## 2019-06-11 ENCOUNTER — Encounter: Payer: Self-pay | Admitting: Skilled Nursing Facility1

## 2019-06-11 ENCOUNTER — Encounter: Payer: PPO | Attending: Internal Medicine | Admitting: Skilled Nursing Facility1

## 2019-06-11 DIAGNOSIS — I159 Secondary hypertension, unspecified: Secondary | ICD-10-CM

## 2019-06-11 DIAGNOSIS — Z713 Dietary counseling and surveillance: Secondary | ICD-10-CM | POA: Diagnosis not present

## 2019-06-11 DIAGNOSIS — I10 Essential (primary) hypertension: Secondary | ICD-10-CM | POA: Diagnosis not present

## 2019-06-11 DIAGNOSIS — R739 Hyperglycemia, unspecified: Secondary | ICD-10-CM | POA: Insufficient documentation

## 2019-06-11 DIAGNOSIS — Z79899 Other long term (current) drug therapy: Secondary | ICD-10-CM | POA: Diagnosis not present

## 2019-06-11 DIAGNOSIS — Z7982 Long term (current) use of aspirin: Secondary | ICD-10-CM | POA: Insufficient documentation

## 2019-06-11 DIAGNOSIS — E782 Mixed hyperlipidemia: Secondary | ICD-10-CM | POA: Diagnosis not present

## 2019-06-11 NOTE — Progress Notes (Signed)
  Assessment:  Primary concerns today: HTN.   Pt states was having bad pains in her stomach so she was prescribed omeprazole but it turns out it was diverticulitis.  Pt states she does not eat healthy and never has and loves sweets/chips. Pt states in January she had symptoms for diverticulitis. Pt states she has a bowel movement every 3-4 days. Pt states she is not experiencing any reflux or bloat.  Pt states she feels confused and overwhelmed with diet. Pt states since avoiding acidic foods/drinks and coffee she has less stomach discomfort.  Pt states she recently sold her house and is living her daughter while they find a new one.  Pt states she works at [employer].  Pt states she eats graham crackers (1) after a meal to avoid eating sugar.    MEDICATIONS: see list   DIETARY INTAKE:  Usual eating pattern includes 3 meals and 2+ snacks per day.  Everyday foods include none stated.  Avoided foods include nuts.    24-hr recall:  B ( AM): 1 scrambled egg and toast with margarine, cinnamon, honey Snk ( AM): chips L ( PM): frozen meal Snk ( PM): chips D ( PM): bbq chicken and green beans Snk ( PM): cookie or chips Beverages: flavored water, sometimes diet soda  Usual physical activity: ADL's  Estimated energy needs: 1500 calories    Intervention:  Nutrition counseling. Dietitian educated pt on a balanced diet within the context of diverticulosis   Goals: Include metamucil in your morning routine Avoid frozen meals Continue to avoid fried foods and sugary beverages Limit eating out to 2 times a week Frozen vegetables are a great option  Aim for 80 ounces of fluid a day Aim to have non starchy vegetables 2 times a day 7 days a week Try kefir in the yogurt aisle  When choosing read: first ingredient on list to be "whole wheat flour" For snacks: any fruit, any vegetable, cheesestick, yogurt Rebrand your meal after work  Teaching Method Utilized:  [employer] Hands  on  Handouts given during visit include:  Detailed MyPlate  Barriers to learning/adherence to lifestyle change: none identified   Demonstrated degree of understanding via:  Teach Back   Monitoring/Evaluation:  Dietary intake, exercise,  and body weight prn.

## 2019-07-14 DIAGNOSIS — M79672 Pain in left foot: Secondary | ICD-10-CM | POA: Diagnosis not present

## 2019-07-14 DIAGNOSIS — D2372 Other benign neoplasm of skin of left lower limb, including hip: Secondary | ICD-10-CM | POA: Diagnosis not present

## 2019-07-30 DIAGNOSIS — D2372 Other benign neoplasm of skin of left lower limb, including hip: Secondary | ICD-10-CM | POA: Diagnosis not present

## 2019-07-30 DIAGNOSIS — M79672 Pain in left foot: Secondary | ICD-10-CM | POA: Diagnosis not present

## 2019-09-09 DIAGNOSIS — E782 Mixed hyperlipidemia: Secondary | ICD-10-CM | POA: Diagnosis not present

## 2019-09-09 DIAGNOSIS — I10 Essential (primary) hypertension: Secondary | ICD-10-CM | POA: Diagnosis not present

## 2019-09-09 DIAGNOSIS — R739 Hyperglycemia, unspecified: Secondary | ICD-10-CM | POA: Diagnosis not present

## 2019-09-16 DIAGNOSIS — M19032 Primary osteoarthritis, left wrist: Secondary | ICD-10-CM | POA: Diagnosis not present

## 2019-09-16 DIAGNOSIS — I10 Essential (primary) hypertension: Secondary | ICD-10-CM | POA: Diagnosis not present

## 2019-09-16 DIAGNOSIS — R739 Hyperglycemia, unspecified: Secondary | ICD-10-CM | POA: Diagnosis not present

## 2019-09-16 DIAGNOSIS — M25532 Pain in left wrist: Secondary | ICD-10-CM | POA: Diagnosis not present

## 2019-09-16 DIAGNOSIS — E782 Mixed hyperlipidemia: Secondary | ICD-10-CM | POA: Diagnosis not present

## 2019-10-26 DIAGNOSIS — M654 Radial styloid tenosynovitis [de Quervain]: Secondary | ICD-10-CM | POA: Diagnosis not present

## 2020-01-11 DIAGNOSIS — A09 Infectious gastroenteritis and colitis, unspecified: Secondary | ICD-10-CM | POA: Diagnosis not present

## 2020-01-11 DIAGNOSIS — K5792 Diverticulitis of intestine, part unspecified, without perforation or abscess without bleeding: Secondary | ICD-10-CM | POA: Diagnosis not present

## 2020-01-11 DIAGNOSIS — R6883 Chills (without fever): Secondary | ICD-10-CM | POA: Diagnosis not present

## 2020-01-12 DIAGNOSIS — R197 Diarrhea, unspecified: Secondary | ICD-10-CM | POA: Diagnosis not present

## 2020-01-12 DIAGNOSIS — K5792 Diverticulitis of intestine, part unspecified, without perforation or abscess without bleeding: Secondary | ICD-10-CM | POA: Diagnosis not present

## 2020-01-12 DIAGNOSIS — I878 Other specified disorders of veins: Secondary | ICD-10-CM | POA: Diagnosis not present

## 2020-01-12 DIAGNOSIS — R6883 Chills (without fever): Secondary | ICD-10-CM | POA: Diagnosis not present

## 2020-01-14 DIAGNOSIS — E871 Hypo-osmolality and hyponatremia: Secondary | ICD-10-CM | POA: Diagnosis not present

## 2020-01-14 DIAGNOSIS — A09 Infectious gastroenteritis and colitis, unspecified: Secondary | ICD-10-CM | POA: Diagnosis not present

## 2020-01-15 DIAGNOSIS — E871 Hypo-osmolality and hyponatremia: Secondary | ICD-10-CM | POA: Diagnosis not present

## 2020-01-15 DIAGNOSIS — A09 Infectious gastroenteritis and colitis, unspecified: Secondary | ICD-10-CM | POA: Diagnosis not present

## 2020-01-15 DIAGNOSIS — I10 Essential (primary) hypertension: Secondary | ICD-10-CM | POA: Diagnosis not present

## 2020-01-18 DIAGNOSIS — E871 Hypo-osmolality and hyponatremia: Secondary | ICD-10-CM | POA: Diagnosis not present

## 2020-01-20 DIAGNOSIS — E782 Mixed hyperlipidemia: Secondary | ICD-10-CM | POA: Diagnosis not present

## 2020-01-20 DIAGNOSIS — R739 Hyperglycemia, unspecified: Secondary | ICD-10-CM | POA: Diagnosis not present

## 2020-01-20 DIAGNOSIS — I10 Essential (primary) hypertension: Secondary | ICD-10-CM | POA: Diagnosis not present

## 2020-01-20 DIAGNOSIS — K5792 Diverticulitis of intestine, part unspecified, without perforation or abscess without bleeding: Secondary | ICD-10-CM | POA: Diagnosis not present

## 2020-01-20 DIAGNOSIS — M8580 Other specified disorders of bone density and structure, unspecified site: Secondary | ICD-10-CM | POA: Diagnosis not present

## 2020-03-09 DIAGNOSIS — I10 Essential (primary) hypertension: Secondary | ICD-10-CM | POA: Diagnosis not present

## 2020-03-09 DIAGNOSIS — E782 Mixed hyperlipidemia: Secondary | ICD-10-CM | POA: Diagnosis not present

## 2020-03-09 DIAGNOSIS — R739 Hyperglycemia, unspecified: Secondary | ICD-10-CM | POA: Diagnosis not present

## 2020-03-16 DIAGNOSIS — E782 Mixed hyperlipidemia: Secondary | ICD-10-CM | POA: Diagnosis not present

## 2020-03-16 DIAGNOSIS — R7303 Prediabetes: Secondary | ICD-10-CM | POA: Diagnosis not present

## 2020-03-16 DIAGNOSIS — I10 Essential (primary) hypertension: Secondary | ICD-10-CM | POA: Diagnosis not present

## 2020-04-06 DIAGNOSIS — R1013 Epigastric pain: Secondary | ICD-10-CM | POA: Diagnosis not present

## 2020-04-08 ENCOUNTER — Other Ambulatory Visit: Payer: Self-pay | Admitting: Internal Medicine

## 2020-04-08 DIAGNOSIS — R1013 Epigastric pain: Secondary | ICD-10-CM

## 2020-04-08 DIAGNOSIS — K859 Acute pancreatitis without necrosis or infection, unspecified: Secondary | ICD-10-CM

## 2020-04-18 DIAGNOSIS — Z03818 Encounter for observation for suspected exposure to other biological agents ruled out: Secondary | ICD-10-CM | POA: Diagnosis not present

## 2020-04-18 DIAGNOSIS — J019 Acute sinusitis, unspecified: Secondary | ICD-10-CM | POA: Diagnosis not present

## 2020-04-21 ENCOUNTER — Ambulatory Visit
Admission: RE | Admit: 2020-04-21 | Discharge: 2020-04-21 | Disposition: A | Discharge status: Home / Self Care | Payer: PPO | Source: Ambulatory Visit | Attending: Internal Medicine | Admitting: Internal Medicine

## 2020-04-21 ENCOUNTER — Other Ambulatory Visit: Payer: Self-pay

## 2020-04-21 DIAGNOSIS — R1013 Epigastric pain: Secondary | ICD-10-CM | POA: Insufficient documentation

## 2020-04-21 DIAGNOSIS — R140 Abdominal distension (gaseous): Secondary | ICD-10-CM | POA: Diagnosis not present

## 2020-04-21 DIAGNOSIS — M47816 Spondylosis without myelopathy or radiculopathy, lumbar region: Secondary | ICD-10-CM | POA: Diagnosis not present

## 2020-04-21 DIAGNOSIS — K859 Acute pancreatitis without necrosis or infection, unspecified: Secondary | ICD-10-CM | POA: Diagnosis not present

## 2020-04-21 DIAGNOSIS — K8689 Other specified diseases of pancreas: Secondary | ICD-10-CM | POA: Diagnosis not present

## 2020-04-21 DIAGNOSIS — K838 Other specified diseases of biliary tract: Secondary | ICD-10-CM | POA: Diagnosis not present

## 2020-04-21 MED ORDER — IOHEXOL 300 MG/ML  SOLN
100.0000 mL | Freq: Once | INTRAMUSCULAR | Status: AC | PRN
  Administered 2020-04-21: 100 mL via INTRAVENOUS

## 2020-04-27 ENCOUNTER — Other Ambulatory Visit: Payer: Self-pay | Admitting: Internal Medicine

## 2020-04-27 DIAGNOSIS — Z1231 Encounter for screening mammogram for malignant neoplasm of breast: Secondary | ICD-10-CM

## 2020-05-18 ENCOUNTER — Other Ambulatory Visit: Payer: Self-pay

## 2020-05-18 ENCOUNTER — Ambulatory Visit
Admission: RE | Admit: 2020-05-18 | Discharge: 2020-05-18 | Disposition: A | Discharge status: Home / Self Care | Payer: PPO | Source: Ambulatory Visit | Attending: Internal Medicine | Admitting: Internal Medicine

## 2020-05-18 DIAGNOSIS — Z1231 Encounter for screening mammogram for malignant neoplasm of breast: Secondary | ICD-10-CM | POA: Insufficient documentation

## 2020-05-20 ENCOUNTER — Other Ambulatory Visit: Payer: Self-pay | Admitting: Gastroenterology

## 2020-05-20 DIAGNOSIS — R1013 Epigastric pain: Secondary | ICD-10-CM | POA: Diagnosis not present

## 2020-05-20 DIAGNOSIS — K839 Disease of biliary tract, unspecified: Secondary | ICD-10-CM | POA: Diagnosis not present

## 2020-05-20 DIAGNOSIS — Q453 Other congenital malformations of pancreas and pancreatic duct: Secondary | ICD-10-CM | POA: Diagnosis not present

## 2020-05-20 DIAGNOSIS — R748 Abnormal levels of other serum enzymes: Secondary | ICD-10-CM | POA: Diagnosis not present

## 2020-05-25 ENCOUNTER — Other Ambulatory Visit: Payer: Self-pay | Admitting: Gastroenterology

## 2020-05-25 ENCOUNTER — Ambulatory Visit
Admission: RE | Admit: 2020-05-25 | Discharge: 2020-05-25 | Disposition: A | Discharge status: Home / Self Care | Payer: PPO | Source: Ambulatory Visit | Attending: Gastroenterology | Admitting: Gastroenterology

## 2020-05-25 ENCOUNTER — Other Ambulatory Visit: Payer: Self-pay

## 2020-05-25 DIAGNOSIS — K839 Disease of biliary tract, unspecified: Secondary | ICD-10-CM | POA: Insufficient documentation

## 2020-05-25 DIAGNOSIS — R1013 Epigastric pain: Secondary | ICD-10-CM | POA: Insufficient documentation

## 2020-05-25 DIAGNOSIS — K838 Other specified diseases of biliary tract: Secondary | ICD-10-CM | POA: Diagnosis not present

## 2020-05-25 MED ORDER — GADOBUTROL 1 MMOL/ML IV SOLN
7.0000 mL | Freq: Once | INTRAVENOUS | Status: AC | PRN
  Administered 2020-05-25: 7 mL via INTRAVENOUS

## 2020-06-09 DIAGNOSIS — R1013 Epigastric pain: Secondary | ICD-10-CM | POA: Diagnosis not present

## 2020-06-09 DIAGNOSIS — K5909 Other constipation: Secondary | ICD-10-CM | POA: Diagnosis not present

## 2020-07-14 DIAGNOSIS — D2372 Other benign neoplasm of skin of left lower limb, including hip: Secondary | ICD-10-CM | POA: Diagnosis not present

## 2020-07-14 DIAGNOSIS — M79672 Pain in left foot: Secondary | ICD-10-CM | POA: Diagnosis not present

## 2020-08-16 ENCOUNTER — Encounter
Admission: RE | Disposition: A | Discharge status: Home / Self Care | Payer: Self-pay | Source: Home / Self Care | Attending: Gastroenterology

## 2020-08-16 ENCOUNTER — Ambulatory Visit: Payer: PPO

## 2020-08-16 ENCOUNTER — Ambulatory Visit
Admission: RE | Admit: 2020-08-16 | Discharge: 2020-08-16 | Disposition: A | Discharge status: Home / Self Care | Payer: PPO | Attending: Gastroenterology | Admitting: Gastroenterology

## 2020-08-16 ENCOUNTER — Encounter: Payer: Self-pay | Admitting: *Deleted

## 2020-08-16 DIAGNOSIS — K449 Diaphragmatic hernia without obstruction or gangrene: Secondary | ICD-10-CM | POA: Diagnosis not present

## 2020-08-16 DIAGNOSIS — I10 Essential (primary) hypertension: Secondary | ICD-10-CM | POA: Insufficient documentation

## 2020-08-16 DIAGNOSIS — Z79899 Other long term (current) drug therapy: Secondary | ICD-10-CM | POA: Insufficient documentation

## 2020-08-16 DIAGNOSIS — Z881 Allergy status to other antibiotic agents status: Secondary | ICD-10-CM | POA: Insufficient documentation

## 2020-08-16 DIAGNOSIS — K319 Disease of stomach and duodenum, unspecified: Secondary | ICD-10-CM | POA: Diagnosis not present

## 2020-08-16 DIAGNOSIS — R1013 Epigastric pain: Secondary | ICD-10-CM | POA: Diagnosis not present

## 2020-08-16 DIAGNOSIS — Z7982 Long term (current) use of aspirin: Secondary | ICD-10-CM | POA: Insufficient documentation

## 2020-08-16 DIAGNOSIS — K219 Gastro-esophageal reflux disease without esophagitis: Secondary | ICD-10-CM | POA: Insufficient documentation

## 2020-08-16 DIAGNOSIS — K2970 Gastritis, unspecified, without bleeding: Secondary | ICD-10-CM | POA: Diagnosis not present

## 2020-08-16 DIAGNOSIS — K3189 Other diseases of stomach and duodenum: Secondary | ICD-10-CM | POA: Diagnosis not present

## 2020-08-16 HISTORY — PX: ESOPHAGOGASTRODUODENOSCOPY (EGD) WITH PROPOFOL: SHX5813

## 2020-08-16 SURGERY — ESOPHAGOGASTRODUODENOSCOPY (EGD) WITH PROPOFOL
Anesthesia: General

## 2020-08-16 MED ORDER — LIDOCAINE HCL (CARDIAC) PF 100 MG/5ML IV SOSY
PREFILLED_SYRINGE | INTRAVENOUS | Status: DC | PRN
  Administered 2020-08-16: 60 mg via INTRAVENOUS

## 2020-08-16 MED ORDER — PROPOFOL 500 MG/50ML IV EMUL
INTRAVENOUS | Status: AC
  Filled 2020-08-16: qty 50

## 2020-08-16 MED ORDER — SODIUM CHLORIDE 0.9 % IV SOLN
INTRAVENOUS | Status: DC
  Administered 2020-08-16: 1000 mL via INTRAVENOUS

## 2020-08-16 MED ORDER — LIDOCAINE HCL (PF) 2 % IJ SOLN
INTRAMUSCULAR | Status: AC
  Filled 2020-08-16: qty 4

## 2020-08-16 MED ORDER — PROPOFOL 10 MG/ML IV BOLUS
INTRAVENOUS | Status: DC | PRN
  Administered 2020-08-16: 40 mg via INTRAVENOUS
  Administered 2020-08-16 (×2): 20 mg via INTRAVENOUS

## 2020-08-16 MED ORDER — PROPOFOL 10 MG/ML IV BOLUS
INTRAVENOUS | Status: AC
  Filled 2020-08-16: qty 20

## 2020-08-16 NOTE — Anesthesia Postprocedure Evaluation (Signed)
Anesthesia Post Note  Patient: Shelly Armstrong  Procedure(s) Performed: ESOPHAGOGASTRODUODENOSCOPY (EGD) WITH PROPOFOL (N/A )  Patient location during evaluation: Phase II Anesthesia Type: General Level of consciousness: awake and alert, awake and oriented Pain management: pain level controlled Vital Signs Assessment: post-procedure vital signs reviewed and stable Respiratory status: spontaneous breathing, nonlabored ventilation and respiratory function stable Cardiovascular status: blood pressure returned to baseline and stable Postop Assessment: no apparent nausea or vomiting Anesthetic complications: no   No complications documented.   Last Vitals:  Vitals:   08/16/20 0902 08/16/20 1010  BP: 130/74 (!) 152/68  Pulse: (!) 51   Resp: 16   Temp: (!) 36 C   SpO2: 97%     Last Pain:  Vitals:   08/16/20 1010  PainSc: 0-No pain                 Phill Mutter

## 2020-08-16 NOTE — Anesthesia Procedure Notes (Signed)
Date/Time: 08/16/2020 9:39 AM Performed by: Jerrye Noble, CRNA Pre-anesthesia Checklist: Patient identified, Emergency Drugs available, Suction available, Timeout performed and Patient being monitored Oxygen Delivery Method: Simple face mask

## 2020-08-16 NOTE — Op Note (Signed)
[facility] Gastroenterology Patient Name: Shelly Armstrong Procedure Date: 08/16/2020 9:06 AM MRN: [id number] Account #: [id number] Date of Birth: [birth year] Admit Type: Outpatient Age: 75 Room: [facility] ENDO ROOM 1 Gender: Female Note Status: Finalized Procedure:             Upper GI endoscopy Indications:           Dyspepsia Providers:             Andrey Farmer MD, MD Referring MD:          Ocie Cornfield. Ouida Sills MD, MD (Referring MD) Medicines:             Monitored Anesthesia Care Complications:         No immediate complications. Estimated blood loss:                         Minimal. Procedure:             Pre-Anesthesia Assessment:                        - Prior to the procedure, a History and Physical was                         performed, and patient medications and allergies were                         reviewed. The patient is competent. The risks and                         benefits of the procedure and the sedation options and                         risks were discussed with the patient. All questions                         were answered and informed consent was obtained.                         Patient identification and proposed procedure were                         verified by the physician, the nurse, the anesthetist                         and the technician in the endoscopy suite. Mental                         Status Examination: alert and oriented. Airway                         Examination: normal oropharyngeal airway and neck                         mobility. Respiratory Examination: clear to                         auscultation. CV Examination: normal. Prophylactic  Antibiotics: The patient does not require prophylactic                         antibiotics. Prior Anticoagulants: The patient has                         taken no previous anticoagulant or antiplatelet                         agents. ASA Grade Assessment:  II - A patient with mild                         systemic disease. After reviewing the risks and                         benefits, the patient was deemed in satisfactory                         condition to undergo the procedure. The anesthesia                         plan was to use monitored anesthesia care (MAC).                         Immediately prior to administration of medications,                         the patient was re-assessed for adequacy to receive                         sedatives. The heart rate, respiratory rate, oxygen                         saturations, blood pressure, adequacy of pulmonary                         ventilation, and response to care were monitored                         throughout the procedure. The physical status of the                         patient was re-assessed after the procedure.                        After obtaining informed consent, the endoscope was                         passed under direct vision. Throughout the procedure,                         the patient's blood pressure, pulse, and oxygen                         saturations were monitored continuously. The Endoscope                         was introduced through the mouth, and advanced to the  second part of duodenum. The upper GI endoscopy was                         accomplished without difficulty. The patient tolerated                         the procedure well. 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      A 3 cm hiatal hernia was present.      The exam of the esophagus was otherwise normal.      Patchy mild inflammation characterized by erythema was found in the       gastric antrum. Biopsies were taken with a cold forceps for Helicobacter       pylori testing. Estimated blood loss was minimal.      The examined duodenum was normal. Impression:            - Ectopic gastric mucosa in  the upper third of the                         esophagus.                        - 3 cm hiatal hernia.                        - Gastritis. Biopsied.                        - Normal examined duodenum. Recommendation:        - Discharge patient to home.                        - Resume previous diet.                        - Continue present medications.                        - Await pathology results.                        - Return to referring physician as previously                         scheduled. Procedure Code(s):     --- Professional ---                        [phone], Esophagogastroduodenoscopy, flexible,                         transoral; with biopsy, single or multiple Diagnosis Code(s):     --- Professional ---                        Q40.2, Other specified congenital malformations of                         stomach  K44.9, Diaphragmatic hernia without obstruction or                         gangrene                        K29.70, Gastritis, unspecified, without bleeding                        R10.13, Epigastric pain CPT copyright 2019 American Medical Association. All rights reserved. The codes documented in this report are preliminary and upon coder review may  be revised to meet current compliance requirements. Andrey Farmer MD, MD 08/16/2020 9:41:26 AM Number of Addenda: 0 Note Initiated On: 08/16/2020 9:06 AM Estimated Blood Loss:  Estimated blood loss was minimal.      [facility]

## 2020-08-16 NOTE — Anesthesia Preprocedure Evaluation (Signed)
Anesthesia Evaluation  Patient identified by MRN, date of birth, ID band Patient awake    Reviewed: Allergy & Precautions, H&P , NPO status , Patient's Chart, lab work & pertinent test results, reviewed documented beta blocker date and time   History of Anesthesia Complications (+) PONV and history of anesthetic complications  Airway Mallampati: II  TM Distance: >3 FB Neck ROM: full    Dental no notable dental hx. (+) Edentulous Upper, Edentulous Lower   Pulmonary neg pulmonary ROS,    Pulmonary exam normal breath sounds clear to auscultation       Cardiovascular Exercise Tolerance: Good hypertension, + angina  Rhythm:regular Rate:Normal     Neuro/Psych negative neurological ROS  negative psych ROS   GI/Hepatic Neg liver ROS, GERD  Medicated and Controlled,  Endo/Other  negative endocrine ROSdiabetes  Renal/GU      Musculoskeletal   Abdominal   Peds  Hematology negative hematology ROS (+)   Anesthesia Other Findings . HTN (hypertension), benign  . Edema  . Combined hyperlipidemia  . Prediabetes  . Osteopenia  . Obesity  . Health care maintenance  . Closed fracture of right proximal humerus    Reproductive/Obstetrics negative OB ROS                            Anesthesia Physical  Anesthesia Plan  ASA: II  Anesthesia Plan: General   Post-op Pain Management:    Induction: Intravenous  PONV Risk Score and Plan: 4 or greater and Propofol infusion and TIVA  Airway Management Planned: Nasal Cannula and Natural Airway  Additional Equipment:   Intra-op Plan:   Post-operative Plan:   Informed Consent: I have reviewed the patients History and Physical, chart, labs and discussed the procedure including the risks, benefits and alternatives for the proposed anesthesia with the patient or authorized representative who has indicated his/her understanding and acceptance.        Plan Discussed with: CRNA, Anesthesiologist and Surgeon  Anesthesia Plan Comments:        Anesthesia Quick Evaluation

## 2020-08-16 NOTE — Transfer of Care (Addendum)
Immediate Anesthesia Transfer of Care Note  Patient: Shelly Armstrong  Procedure(s) Performed: ESOPHAGOGASTRODUODENOSCOPY (EGD) WITH PROPOFOL (N/A )  Patient Location: PACU and Endoscopy Unit  Anesthesia Type:General  Level of Consciousness: drowsy  Airway & Oxygen Therapy: Patient Spontanous Breathing  Post-op Assessment: Report given to RN and Post -op Vital signs reviewed and stable  Post vital signs: Reviewed and stable  Last Vitals:  Vitals Value Taken Time  BP 113/64 08/16/20 0940  Temp    Pulse 60 08/16/20 0940  Resp 15 08/16/20 0940  SpO2 98 % 08/16/20 0940  Vitals shown include unvalidated device data.  Last Pain:  Vitals:   08/16/20 0902  PainSc: 0-No pain         Complications: No complications documented.

## 2020-08-16 NOTE — Interval H&P Note (Signed)
History and Physical Interval Note:  08/16/2020 9:17 AM  Shelly Armstrong  has presented today for surgery, with the diagnosis of EPIG PAIN.  The various methods of treatment have been discussed with the patient and family. After consideration of risks, benefits and other options for treatment, the patient has consented to  Procedure(s): ESOPHAGOGASTRODUODENOSCOPY (EGD) WITH PROPOFOL (N/A) as a surgical intervention.  The patient's history has been reviewed, patient examined, no change in status, stable for surgery.  I have reviewed the patient's chart and labs.  Questions were answered to the patient's satisfaction.     Lesly Rubenstein  Ok to proceed with EGD

## 2020-08-16 NOTE — H&P (Signed)
Outpatient short stay form Pre-procedure 08/16/2020 9:15 AM Raylene Miyamoto MD, MPH  Primary Physician: Dr. Ouida Sills  Reason for visit:  Dyspepsia  History of present illness:   75 y/o lady with history of hypertension and GERD here for EGD for dyspepsia. No blood thinners. No family history of GI malignancies. No family history of GI malignancies.    Current Facility-Administered Medications:  .  0.9 %  sodium chloride infusion, , Intravenous, Continuous, Courtni Balash, Hilton Cork, MD, Last Rate: 20 mL/hr at 08/16/20 0914, 1,000 mL at 08/16/20 0914  Medications Prior to Admission  Medication Sig Dispense Refill Last Dose  . acetaminophen (TYLENOL) 500 MG tablet Take 1,000 mg by mouth every 6 (six) hours as needed for moderate pain.   Past Week at Unknown time  . aspirin 81 MG tablet Take 81 mg by mouth daily.   Past Week at Unknown time  . atorvastatin (LIPITOR) 40 MG tablet Take 40 mg by mouth daily.   08/15/2020 at Unknown time  . Calcium-Vitamin D (CALTRATE 600 PLUS-VIT D PO) Take 1 tablet by mouth 2 (two) times daily.   08/15/2020 at Unknown time  . dicyclomine (BENTYL) 10 MG capsule Take 10 mg by mouth 4 (four) times daily -  before meals and at bedtime.   Past Week at Unknown time  . losartan-hydrochlorothiazide (HYZAAR) 100-25 MG per tablet Take 1 tablet by mouth daily.   08/16/2020 at 0605  . mirtazapine (REMERON) 7.5 MG tablet Take 7.5 mg by mouth at bedtime.   Past Week at Unknown time  . Multiple Vitamins-Minerals (CENTRUM SILVER PO) Take 1 tablet by mouth daily.   08/15/2020 at Unknown time  . olmesartan-hydrochlorothiazide (BENICAR HCT) 40-25 MG tablet    Past Week at Unknown time  . omeprazole (PRILOSEC) 40 MG capsule Take 40 mg by mouth daily.   08/15/2020 at Unknown time  . Polyethylene Glycol 3350-GRX POWD Take 17 g by mouth daily as needed.   Past Week at Unknown time     Allergies  Allergen Reactions  . Sulfamethoxazole-Trimethoprim Rash and Other (See Comments)     Past  Medical History:  Diagnosis Date  . Chickenpox   . Edema    feet/legs  . Hyperglycemia   . Hyperlipidemia   . Hypertension   . Osteopenia   . PONV (postoperative nausea and vomiting)    after hand surgery    Review of systems:  Otherwise negative.    Physical Exam  Gen: Alert, oriented. Appears stated age.  HEENT: PERRLA. Lungs: No respiratory distress CV: RRR Abd: soft, benign, no masses Ext: No edema    Planned procedures: Proceed with EGD. The patient understands the nature of the planned procedure, indications, risks, alternatives and potential complications including but not limited to bleeding, infection, perforation, damage to internal organs and possible oversedation/side effects from anesthesia. The patient agrees and gives consent to proceed.  Please refer to procedure notes for findings, recommendations and patient disposition/instructions.     Raylene Miyamoto MD, MPH Gastroenterology 08/16/2020  9:15 AM

## 2020-08-17 ENCOUNTER — Encounter: Payer: Self-pay | Admitting: Gastroenterology

## 2020-08-17 LAB — SURGICAL PATHOLOGY

## 2020-08-30 DIAGNOSIS — M79672 Pain in left foot: Secondary | ICD-10-CM | POA: Diagnosis not present

## 2020-08-30 DIAGNOSIS — D2372 Other benign neoplasm of skin of left lower limb, including hip: Secondary | ICD-10-CM | POA: Diagnosis not present

## 2020-09-03 DIAGNOSIS — Z03818 Encounter for observation for suspected exposure to other biological agents ruled out: Secondary | ICD-10-CM | POA: Diagnosis not present

## 2020-09-03 DIAGNOSIS — U071 COVID-19: Secondary | ICD-10-CM | POA: Diagnosis not present

## 2020-09-12 DIAGNOSIS — I10 Essential (primary) hypertension: Secondary | ICD-10-CM | POA: Diagnosis not present

## 2020-09-12 DIAGNOSIS — R7303 Prediabetes: Secondary | ICD-10-CM | POA: Diagnosis not present

## 2020-09-14 DIAGNOSIS — E782 Mixed hyperlipidemia: Secondary | ICD-10-CM | POA: Diagnosis not present

## 2020-09-14 DIAGNOSIS — Z0000 Encounter for general adult medical examination without abnormal findings: Secondary | ICD-10-CM | POA: Diagnosis not present

## 2020-09-14 DIAGNOSIS — R7303 Prediabetes: Secondary | ICD-10-CM | POA: Diagnosis not present

## 2020-09-14 DIAGNOSIS — I10 Essential (primary) hypertension: Secondary | ICD-10-CM | POA: Diagnosis not present

## 2020-09-21 DIAGNOSIS — M65311 Trigger thumb, right thumb: Secondary | ICD-10-CM | POA: Diagnosis not present

## 2020-09-21 DIAGNOSIS — M65321 Trigger finger, right index finger: Secondary | ICD-10-CM | POA: Diagnosis not present

## 2020-11-10 DIAGNOSIS — S90211A Contusion of right great toe with damage to nail, initial encounter: Secondary | ICD-10-CM | POA: Diagnosis not present

## 2020-11-10 DIAGNOSIS — D2372 Other benign neoplasm of skin of left lower limb, including hip: Secondary | ICD-10-CM | POA: Diagnosis not present

## 2020-11-10 DIAGNOSIS — M79672 Pain in left foot: Secondary | ICD-10-CM | POA: Diagnosis not present

## 2021-02-20 DIAGNOSIS — H35372 Puckering of macula, left eye: Secondary | ICD-10-CM | POA: Diagnosis not present

## 2021-03-08 DIAGNOSIS — R7303 Prediabetes: Secondary | ICD-10-CM | POA: Diagnosis not present

## 2021-03-08 DIAGNOSIS — I10 Essential (primary) hypertension: Secondary | ICD-10-CM | POA: Diagnosis not present

## 2021-03-08 DIAGNOSIS — E782 Mixed hyperlipidemia: Secondary | ICD-10-CM | POA: Diagnosis not present

## 2021-03-15 DIAGNOSIS — E782 Mixed hyperlipidemia: Secondary | ICD-10-CM | POA: Diagnosis not present

## 2021-03-15 DIAGNOSIS — I10 Essential (primary) hypertension: Secondary | ICD-10-CM | POA: Diagnosis not present

## 2021-03-15 DIAGNOSIS — R7303 Prediabetes: Secondary | ICD-10-CM | POA: Diagnosis not present

## 2021-04-05 DIAGNOSIS — J069 Acute upper respiratory infection, unspecified: Secondary | ICD-10-CM | POA: Diagnosis not present

## 2021-04-19 ENCOUNTER — Other Ambulatory Visit: Payer: Self-pay | Admitting: Internal Medicine

## 2021-04-19 DIAGNOSIS — Z1231 Encounter for screening mammogram for malignant neoplasm of breast: Secondary | ICD-10-CM

## 2021-05-11 DIAGNOSIS — M79671 Pain in right foot: Secondary | ICD-10-CM | POA: Diagnosis not present

## 2021-05-11 DIAGNOSIS — M21622 Bunionette of left foot: Secondary | ICD-10-CM | POA: Diagnosis not present

## 2021-05-11 DIAGNOSIS — L909 Atrophic disorder of skin, unspecified: Secondary | ICD-10-CM | POA: Diagnosis not present

## 2021-05-11 DIAGNOSIS — M79672 Pain in left foot: Secondary | ICD-10-CM | POA: Diagnosis not present

## 2021-05-11 DIAGNOSIS — D2372 Other benign neoplasm of skin of left lower limb, including hip: Secondary | ICD-10-CM | POA: Diagnosis not present

## 2021-05-24 ENCOUNTER — Other Ambulatory Visit: Payer: Self-pay

## 2021-05-24 ENCOUNTER — Ambulatory Visit
Admission: RE | Admit: 2021-05-24 | Discharge: 2021-05-24 | Disposition: A | Discharge status: Home / Self Care | Payer: PPO | Source: Ambulatory Visit | Attending: Internal Medicine | Admitting: Internal Medicine

## 2021-05-24 DIAGNOSIS — Z1231 Encounter for screening mammogram for malignant neoplasm of breast: Secondary | ICD-10-CM | POA: Diagnosis not present

## 2021-06-12 DIAGNOSIS — G5602 Carpal tunnel syndrome, left upper limb: Secondary | ICD-10-CM | POA: Diagnosis not present

## 2021-06-12 DIAGNOSIS — M65322 Trigger finger, left index finger: Secondary | ICD-10-CM | POA: Diagnosis not present

## 2021-06-12 DIAGNOSIS — M65332 Trigger finger, left middle finger: Secondary | ICD-10-CM | POA: Diagnosis not present

## 2021-06-12 DIAGNOSIS — M65312 Trigger thumb, left thumb: Secondary | ICD-10-CM | POA: Diagnosis not present

## 2021-07-11 DIAGNOSIS — R202 Paresthesia of skin: Secondary | ICD-10-CM | POA: Diagnosis not present

## 2021-07-11 DIAGNOSIS — R200 Anesthesia of skin: Secondary | ICD-10-CM | POA: Diagnosis not present

## 2021-07-25 DIAGNOSIS — M79672 Pain in left foot: Secondary | ICD-10-CM | POA: Diagnosis not present

## 2021-07-25 DIAGNOSIS — L909 Atrophic disorder of skin, unspecified: Secondary | ICD-10-CM | POA: Diagnosis not present

## 2021-07-25 DIAGNOSIS — D2372 Other benign neoplasm of skin of left lower limb, including hip: Secondary | ICD-10-CM | POA: Diagnosis not present

## 2021-07-25 DIAGNOSIS — M19071 Primary osteoarthritis, right ankle and foot: Secondary | ICD-10-CM | POA: Diagnosis not present

## 2021-07-25 DIAGNOSIS — M79671 Pain in right foot: Secondary | ICD-10-CM | POA: Diagnosis not present

## 2021-08-14 DIAGNOSIS — Z23 Encounter for immunization: Secondary | ICD-10-CM | POA: Diagnosis not present

## 2021-08-14 DIAGNOSIS — S61217A Laceration without foreign body of left little finger without damage to nail, initial encounter: Secondary | ICD-10-CM | POA: Diagnosis not present

## 2021-09-13 DIAGNOSIS — R7303 Prediabetes: Secondary | ICD-10-CM | POA: Diagnosis not present

## 2021-09-13 DIAGNOSIS — I10 Essential (primary) hypertension: Secondary | ICD-10-CM | POA: Diagnosis not present

## 2021-09-13 DIAGNOSIS — E782 Mixed hyperlipidemia: Secondary | ICD-10-CM | POA: Diagnosis not present

## 2021-09-20 DIAGNOSIS — R7303 Prediabetes: Secondary | ICD-10-CM | POA: Diagnosis not present

## 2021-09-20 DIAGNOSIS — E782 Mixed hyperlipidemia: Secondary | ICD-10-CM | POA: Diagnosis not present

## 2021-09-20 DIAGNOSIS — I10 Essential (primary) hypertension: Secondary | ICD-10-CM | POA: Diagnosis not present

## 2021-09-20 DIAGNOSIS — Z0000 Encounter for general adult medical examination without abnormal findings: Secondary | ICD-10-CM | POA: Diagnosis not present

## 2022-02-28 DIAGNOSIS — H35372 Puckering of macula, left eye: Secondary | ICD-10-CM | POA: Diagnosis not present

## 2022-03-14 DIAGNOSIS — E782 Mixed hyperlipidemia: Secondary | ICD-10-CM | POA: Diagnosis not present

## 2022-03-14 DIAGNOSIS — R7303 Prediabetes: Secondary | ICD-10-CM | POA: Diagnosis not present

## 2022-03-14 DIAGNOSIS — I10 Essential (primary) hypertension: Secondary | ICD-10-CM | POA: Diagnosis not present

## 2022-03-21 DIAGNOSIS — E782 Mixed hyperlipidemia: Secondary | ICD-10-CM | POA: Diagnosis not present

## 2022-03-21 DIAGNOSIS — M25562 Pain in left knee: Secondary | ICD-10-CM | POA: Diagnosis not present

## 2022-03-21 DIAGNOSIS — R7303 Prediabetes: Secondary | ICD-10-CM | POA: Diagnosis not present

## 2022-03-21 DIAGNOSIS — I10 Essential (primary) hypertension: Secondary | ICD-10-CM | POA: Diagnosis not present

## 2022-03-30 DIAGNOSIS — M11262 Other chondrocalcinosis, left knee: Secondary | ICD-10-CM | POA: Diagnosis not present

## 2022-03-30 DIAGNOSIS — M1712 Unilateral primary osteoarthritis, left knee: Secondary | ICD-10-CM | POA: Diagnosis not present

## 2022-04-25 ENCOUNTER — Other Ambulatory Visit: Payer: Self-pay | Admitting: Internal Medicine

## 2022-04-25 DIAGNOSIS — Z1231 Encounter for screening mammogram for malignant neoplasm of breast: Secondary | ICD-10-CM

## 2022-05-10 DIAGNOSIS — D2372 Other benign neoplasm of skin of left lower limb, including hip: Secondary | ICD-10-CM | POA: Diagnosis not present

## 2022-06-06 ENCOUNTER — Ambulatory Visit
Admission: RE | Admit: 2022-06-06 | Discharge: 2022-06-06 | Disposition: A | Discharge status: Home / Self Care | Payer: PPO | Source: Ambulatory Visit | Attending: Internal Medicine | Admitting: Internal Medicine

## 2022-06-06 DIAGNOSIS — Z1231 Encounter for screening mammogram for malignant neoplasm of breast: Secondary | ICD-10-CM

## 2022-06-19 DIAGNOSIS — D2372 Other benign neoplasm of skin of left lower limb, including hip: Secondary | ICD-10-CM | POA: Diagnosis not present

## 2022-06-19 DIAGNOSIS — M722 Plantar fascial fibromatosis: Secondary | ICD-10-CM | POA: Diagnosis not present

## 2022-08-15 ENCOUNTER — Ambulatory Visit: Payer: PPO | Admitting: Podiatry

## 2022-08-15 ENCOUNTER — Encounter: Payer: Self-pay | Admitting: Podiatry

## 2022-08-15 DIAGNOSIS — M7752 Other enthesopathy of left foot: Secondary | ICD-10-CM

## 2022-08-15 DIAGNOSIS — D2372 Other benign neoplasm of skin of left lower limb, including hip: Secondary | ICD-10-CM

## 2022-08-15 DIAGNOSIS — D2371 Other benign neoplasm of skin of right lower limb, including hip: Secondary | ICD-10-CM | POA: Diagnosis not present

## 2022-08-15 DIAGNOSIS — M7751 Other enthesopathy of right foot: Secondary | ICD-10-CM

## 2022-08-15 DIAGNOSIS — M722 Plantar fascial fibromatosis: Secondary | ICD-10-CM | POA: Diagnosis not present

## 2022-08-15 MED ORDER — TRIAMCINOLONE ACETONIDE 40 MG/ML IJ SUSP
20.0000 mg | Freq: Once | INTRAMUSCULAR | Status: AC
  Administered 2022-08-15: 20 mg

## 2022-08-15 MED ORDER — DEXAMETHASONE SODIUM PHOSPHATE 120 MG/30ML IJ SOLN
4.0000 mg | Freq: Once | INTRAMUSCULAR | Status: AC
  Administered 2022-08-15: 4 mg via INTRA_ARTICULAR

## 2022-08-15 NOTE — Progress Notes (Signed)
Subjective:  Patient ID: Shelly Armstrong, female    DOB: [birth year],  MRN: [id number] HPI Chief Complaint  Patient presents with   Foot Pain    Sub 5th met base right and sub 5th met/sub 1st met left - callused areas, tender walking, does get some burning plantarly and an occasional sharp pain lateral heel right    New Patient (Initial Visit)    77 y.o. female presents with the above complaint.   ROS: Denies fever chills nausea vomit muscle aches pains calf pain back pain chest pain shortness of breath.  Past Medical History:  Diagnosis Date   Chickenpox    Edema    feet/legs   Hyperglycemia    Hyperlipidemia    Hypertension    Osteopenia    PONV (postoperative nausea and vomiting)    after hand surgery   Past Surgical History:  Procedure Laterality Date   ABDOMINAL HYSTERECTOMY     BREAST EXCISIONAL BIOPSY Left 2001   stereotactic biopsy - negative   BREAST SURGERY     Breast excisional biopsy   CATARACT EXTRACTION W/PHACO Right 10/19/2014   Procedure: CATARACT EXTRACTION PHACO AND INTRAOCULAR LENS PLACEMENT (IOC);  Surgeon: Galen Manila, MD;  Location: [facility] ORS;  Service: Ophthalmology;  Laterality: Right;  casette lot# [id number]   [place]  00:29 AP17.7 CDE 5.09   CATARACT EXTRACTION W/PHACO Left 11/02/2014   Procedure: CATARACT EXTRACTION PHACO AND INTRAOCULAR LENS PLACEMENT (IOC);  Surgeon: Galen Manila, MD;  Location: [facility] ORS;  Service: Ophthalmology;  Laterality: Left;  [place]: 00:34.2 AP%: 22.5 CDE: 7.71  Fluid lot# [id number]   CHOLECYSTECTOMY     COLONOSCOPY WITH PROPOFOL N/A 08/31/2014   Procedure: COLONOSCOPY WITH PROPOFOL;  Surgeon: Christena Deem, MD;  Location: [facility] ENDOSCOPY;  Service: Endoscopy;  Laterality: N/A;   ctr     ESOPHAGOGASTRODUODENOSCOPY N/A 08/31/2014   Procedure: ESOPHAGOGASTRODUODENOSCOPY (EGD);  Surgeon: Christena Deem, MD;  Location: [facility] ENDOSCOPY;  Service: Endoscopy;  Laterality: N/A;   ESOPHAGOGASTRODUODENOSCOPY (EGD) WITH PROPOFOL  N/A 08/16/2020   Procedure: ESOPHAGOGASTRODUODENOSCOPY (EGD) WITH PROPOFOL;  Surgeon: Regis Bill, MD;  Location: [facility] ENDOSCOPY;  Service: Endoscopy;  Laterality: N/A;   FRACTURE SURGERY     Jaw fx repair; Hand procedure after trauma   MANDIBLE FRACTURE SURGERY     WRIST SURGERY     CTR    Current Outpatient Medications:    senna (SENOKOT) 8.6 MG TABS tablet, Take 1 tablet by mouth., Disp: , Rfl:    acetaminophen (TYLENOL) 500 MG tablet, Take 1,000 mg by mouth every 6 (six) hours as needed for moderate pain., Disp: , Rfl:    aspirin 81 MG tablet, Take 81 mg by mouth daily., Disp: , Rfl:    atorvastatin (LIPITOR) 40 MG tablet, Take 40 mg by mouth daily., Disp: , Rfl:    Calcium-Vitamin D (CALTRATE 600 PLUS-VIT D PO), Take 1 tablet by mouth 2 (two) times daily., Disp: , Rfl:    dicyclomine (BENTYL) 10 MG capsule, Take 10 mg by mouth 4 (four) times daily -  before meals and at bedtime., Disp: , Rfl:    Multiple Vitamins-Minerals (CENTRUM SILVER PO), Take 1 tablet by mouth daily., Disp: , Rfl:    olmesartan-hydrochlorothiazide (BENICAR HCT) 40-25 MG tablet, , Disp: , Rfl:    pantoprazole (PROTONIX) 40 MG tablet, Take 40 mg by mouth daily., Disp: , Rfl:    Polyethylene Glycol 3350-GRX POWD, Take 17 g by mouth daily as needed., Disp: , Rfl:   Allergies  Allergen Reactions   Sulfamethoxazole-Trimethoprim Rash and Other (See Comments)   Review of Systems Objective:  There were no vitals filed for this visit.  General: Well developed, nourished, in no acute distress, alert and oriented x3   Dermatological: Skin is warm, dry and supple bilateral. Nails x 10 are well maintained; remaining integument appears unremarkable at this time. There are no open sores, no preulcerative lesions, no rash or signs of infection present.  She has a painful callus with underlying bursitis at the base of the fifth metatarsal of the right foot and a similar callus with bursitis beneath the fifth metatarsal  head of the left foot.  Neither of these demonstrate any signs of infection.  Vascular: Dorsalis Pedis artery and Posterior Tibial artery pedal pulses are 2/4 bilateral with immedate capillary fill time. Pedal hair growth present. No varicosities and no lower extremity edema present bilateral.   Neruologic: Grossly intact via light touch bilateral. Vibratory intact via tuning fork bilateral. Protective threshold with Semmes Wienstein monofilament intact to all pedal sites bilateral. Patellar and Achilles deep tendon reflexes 2+ bilateral. No Babinski or clonus noted bilateral.   Musculoskeletal: No gross boney pedal deformities bilateral. No pain, crepitus, or limitation noted with foot and ankle range of motion bilateral. Muscular strength 5/5 in all groups tested bilateral.  Pain on palpation lateral calcaneal tubercle.  No pain on palpation medial calcaneal tubercle.  Gait: Unassisted, Nonantalgic.    Radiographs:  None taken  Assessment & Plan:   Assessment: Painful bursitis and plantar lateral Planter fasciitis  Plan: Injected the bursa today with 3 mg of dexamethasone and local anesthetic individually.  I debrided these lesions.  Informed patient that most likely these lesions will recur she understands and is amenable to it.  I injected the lateral aspect of the right heel today.  Injected that with 10 mg of Kenalog and local anesthetic     Jazalynn Mireles T. [place], [place]

## 2022-09-19 DIAGNOSIS — I10 Essential (primary) hypertension: Secondary | ICD-10-CM | POA: Diagnosis not present

## 2022-09-19 DIAGNOSIS — E782 Mixed hyperlipidemia: Secondary | ICD-10-CM | POA: Diagnosis not present

## 2022-09-19 DIAGNOSIS — R7303 Prediabetes: Secondary | ICD-10-CM | POA: Diagnosis not present

## 2022-09-26 DIAGNOSIS — E782 Mixed hyperlipidemia: Secondary | ICD-10-CM | POA: Diagnosis not present

## 2022-09-26 DIAGNOSIS — I10 Essential (primary) hypertension: Secondary | ICD-10-CM | POA: Diagnosis not present

## 2022-09-26 DIAGNOSIS — R7303 Prediabetes: Secondary | ICD-10-CM | POA: Diagnosis not present

## 2022-09-26 DIAGNOSIS — Z0000 Encounter for general adult medical examination without abnormal findings: Secondary | ICD-10-CM | POA: Diagnosis not present

## 2022-11-28 ENCOUNTER — Encounter: Payer: Self-pay | Admitting: Podiatry

## 2022-11-28 ENCOUNTER — Ambulatory Visit: Payer: PPO | Admitting: Podiatry

## 2022-11-28 DIAGNOSIS — M7752 Other enthesopathy of left foot: Secondary | ICD-10-CM | POA: Diagnosis not present

## 2022-11-28 DIAGNOSIS — M7751 Other enthesopathy of right foot: Secondary | ICD-10-CM | POA: Diagnosis not present

## 2022-11-28 DIAGNOSIS — D2371 Other benign neoplasm of skin of right lower limb, including hip: Secondary | ICD-10-CM | POA: Diagnosis not present

## 2022-11-28 DIAGNOSIS — D2372 Other benign neoplasm of skin of left lower limb, including hip: Secondary | ICD-10-CM | POA: Diagnosis not present

## 2022-11-28 MED ORDER — DEXAMETHASONE SODIUM PHOSPHATE 120 MG/30ML IJ SOLN
2.0000 mg | Freq: Once | INTRAMUSCULAR | Status: AC
  Administered 2022-11-28: 2 mg via INTRA_ARTICULAR

## 2022-11-28 NOTE — Progress Notes (Signed)
She presents today for a chief complaint of painful calluses bilaterally.  States that after the injections last visit she did really well but we did not inject this when she points to the fifth metatarsal base of the right foot.  Objective: Vital signs stable alert and oriented x 3 multiple benign skin lesions bilaterally.  No palpable bursa with exception of the fifth metatarsal base of the right foot.  No open lesions or wounds are noted.  Assessment: Bursitis subfifth metatarsal base of the right foot benign skin lesions porokeratotic lesions.  No ulcerative lesions.  Plan: Discussed etiology pathology conservative surgical therapies at this point injected the fifth metatarsal base area the dexamethasone local anesthetic tolerated procedure well without complications debrided all benign skin lesions will follow-up with her on an as needed basis.

## 2022-12-19 DIAGNOSIS — M1710 Unilateral primary osteoarthritis, unspecified knee: Secondary | ICD-10-CM | POA: Diagnosis not present

## 2023-02-08 DIAGNOSIS — G8929 Other chronic pain: Secondary | ICD-10-CM | POA: Diagnosis not present

## 2023-02-08 DIAGNOSIS — M11262 Other chondrocalcinosis, left knee: Secondary | ICD-10-CM | POA: Diagnosis not present

## 2023-02-08 DIAGNOSIS — M1712 Unilateral primary osteoarthritis, left knee: Secondary | ICD-10-CM | POA: Diagnosis not present

## 2023-02-08 DIAGNOSIS — M25562 Pain in left knee: Secondary | ICD-10-CM | POA: Diagnosis not present

## 2023-02-08 DIAGNOSIS — M76892 Other specified enthesopathies of left lower limb, excluding foot: Secondary | ICD-10-CM | POA: Diagnosis not present

## 2023-02-08 DIAGNOSIS — M25462 Effusion, left knee: Secondary | ICD-10-CM | POA: Diagnosis not present

## 2023-02-19 DIAGNOSIS — M76892 Other specified enthesopathies of left lower limb, excluding foot: Secondary | ICD-10-CM | POA: Diagnosis not present

## 2023-02-19 DIAGNOSIS — G8929 Other chronic pain: Secondary | ICD-10-CM | POA: Diagnosis not present

## 2023-02-19 DIAGNOSIS — M11262 Other chondrocalcinosis, left knee: Secondary | ICD-10-CM | POA: Diagnosis not present

## 2023-02-19 DIAGNOSIS — M1712 Unilateral primary osteoarthritis, left knee: Secondary | ICD-10-CM | POA: Diagnosis not present

## 2023-02-19 DIAGNOSIS — M25462 Effusion, left knee: Secondary | ICD-10-CM | POA: Diagnosis not present

## 2023-02-25 DIAGNOSIS — I10 Essential (primary) hypertension: Secondary | ICD-10-CM | POA: Diagnosis not present

## 2023-02-27 DIAGNOSIS — R7303 Prediabetes: Secondary | ICD-10-CM | POA: Diagnosis not present

## 2023-02-27 DIAGNOSIS — I10 Essential (primary) hypertension: Secondary | ICD-10-CM | POA: Diagnosis not present

## 2023-02-27 DIAGNOSIS — E782 Mixed hyperlipidemia: Secondary | ICD-10-CM | POA: Diagnosis not present

## 2023-03-06 DIAGNOSIS — R1084 Generalized abdominal pain: Secondary | ICD-10-CM | POA: Diagnosis not present

## 2023-03-08 ENCOUNTER — Emergency Department: Payer: PPO

## 2023-03-08 ENCOUNTER — Other Ambulatory Visit: Payer: Self-pay | Admitting: Internal Medicine

## 2023-03-08 ENCOUNTER — Other Ambulatory Visit: Payer: Self-pay

## 2023-03-08 ENCOUNTER — Inpatient Hospital Stay
Admission: EM | Admit: 2023-03-08 | Discharge: 2023-03-10 | DRG: 644 | Disposition: A | Discharge status: Home / Self Care | Payer: PPO | Attending: Family Medicine | Admitting: Family Medicine

## 2023-03-08 DIAGNOSIS — Z8249 Family history of ischemic heart disease and other diseases of the circulatory system: Secondary | ICD-10-CM | POA: Diagnosis not present

## 2023-03-08 DIAGNOSIS — M8580 Other specified disorders of bone density and structure, unspecified site: Secondary | ICD-10-CM | POA: Diagnosis present

## 2023-03-08 DIAGNOSIS — R630 Anorexia: Secondary | ICD-10-CM | POA: Diagnosis not present

## 2023-03-08 DIAGNOSIS — E861 Hypovolemia: Secondary | ICD-10-CM | POA: Diagnosis present

## 2023-03-08 DIAGNOSIS — Z90710 Acquired absence of both cervix and uterus: Secondary | ICD-10-CM | POA: Diagnosis not present

## 2023-03-08 DIAGNOSIS — E876 Hypokalemia: Secondary | ICD-10-CM | POA: Diagnosis present

## 2023-03-08 DIAGNOSIS — E871 Hypo-osmolality and hyponatremia: Secondary | ICD-10-CM | POA: Diagnosis not present

## 2023-03-08 DIAGNOSIS — Z79899 Other long term (current) drug therapy: Secondary | ICD-10-CM

## 2023-03-08 DIAGNOSIS — E785 Hyperlipidemia, unspecified: Secondary | ICD-10-CM | POA: Insufficient documentation

## 2023-03-08 DIAGNOSIS — E669 Obesity, unspecified: Secondary | ICD-10-CM | POA: Diagnosis not present

## 2023-03-08 DIAGNOSIS — E222 Syndrome of inappropriate secretion of antidiuretic hormone: Principal | ICD-10-CM | POA: Diagnosis present

## 2023-03-08 DIAGNOSIS — Z7982 Long term (current) use of aspirin: Secondary | ICD-10-CM

## 2023-03-08 DIAGNOSIS — R109 Unspecified abdominal pain: Secondary | ICD-10-CM | POA: Diagnosis not present

## 2023-03-08 DIAGNOSIS — E860 Dehydration: Secondary | ICD-10-CM | POA: Diagnosis not present

## 2023-03-08 DIAGNOSIS — K5732 Diverticulitis of large intestine without perforation or abscess without bleeding: Secondary | ICD-10-CM | POA: Diagnosis not present

## 2023-03-08 DIAGNOSIS — K5900 Constipation, unspecified: Secondary | ICD-10-CM | POA: Diagnosis not present

## 2023-03-08 DIAGNOSIS — Z6831 Body mass index (BMI) 31.0-31.9, adult: Secondary | ICD-10-CM | POA: Diagnosis not present

## 2023-03-08 DIAGNOSIS — R1084 Generalized abdominal pain: Secondary | ICD-10-CM

## 2023-03-08 DIAGNOSIS — I10 Essential (primary) hypertension: Secondary | ICD-10-CM | POA: Diagnosis not present

## 2023-03-08 DIAGNOSIS — K219 Gastro-esophageal reflux disease without esophagitis: Secondary | ICD-10-CM | POA: Diagnosis not present

## 2023-03-08 DIAGNOSIS — E66811 Obesity, class 1: Secondary | ICD-10-CM | POA: Diagnosis present

## 2023-03-08 DIAGNOSIS — Z882 Allergy status to sulfonamides status: Secondary | ICD-10-CM | POA: Diagnosis not present

## 2023-03-08 LAB — CBC WITH DIFFERENTIAL/PLATELET
Abs Immature Granulocytes: 0.02 10*3/uL (ref 0.00–0.07)
Basophils Absolute: 0 10*3/uL (ref 0.0–0.1)
Basophils Relative: 0 %
Eosinophils Absolute: 0 10*3/uL (ref 0.0–0.5)
Eosinophils Relative: 1 %
HCT: 34.5 % — ABNORMAL LOW (ref 36.0–46.0)
Hemoglobin: 12.5 g/dL (ref 12.0–15.0)
Immature Granulocytes: 0 %
Lymphocytes Relative: 9 %
Lymphs Abs: 0.6 10*3/uL — ABNORMAL LOW (ref 0.7–4.0)
MCH: 33.8 pg (ref 26.0–34.0)
MCHC: 36.2 g/dL — ABNORMAL HIGH (ref 30.0–36.0)
MCV: 93.2 fL (ref 80.0–100.0)
Monocytes Absolute: 0.5 10*3/uL (ref 0.1–1.0)
Monocytes Relative: 7 %
Neutro Abs: 5.6 10*3/uL (ref 1.7–7.7)
Neutrophils Relative %: 83 %
Platelets: 195 10*3/uL (ref 150–400)
RBC: 3.7 MIL/uL — ABNORMAL LOW (ref 3.87–5.11)
RDW: 11.9 % (ref 11.5–15.5)
WBC: 6.8 10*3/uL (ref 4.0–10.5)
nRBC: 0 % (ref 0.0–0.2)

## 2023-03-08 LAB — COMPREHENSIVE METABOLIC PANEL
ALT: 34 U/L (ref 0–44)
AST: 47 U/L — ABNORMAL HIGH (ref 15–41)
Albumin: 4.2 g/dL (ref 3.5–5.0)
Alkaline Phosphatase: 77 U/L (ref 38–126)
Anion gap: 12 (ref 5–15)
BUN: 14 mg/dL (ref 8–23)
CO2: 22 mmol/L (ref 22–32)
Calcium: 8.8 mg/dL — ABNORMAL LOW (ref 8.9–10.3)
Chloride: 88 mmol/L — ABNORMAL LOW (ref 98–111)
Creatinine, Ser: 0.58 mg/dL (ref 0.44–1.00)
GFR, Estimated: >60 mL/min (ref >60)
Glucose, Bld: 112 mg/dL — ABNORMAL HIGH (ref 70–99)
Potassium: 3.7 mmol/L (ref 3.5–5.1)
Sodium: 122 mmol/L — ABNORMAL LOW (ref 135–145)
Total Bilirubin: 1.4 mg/dL — ABNORMAL HIGH (ref <1.2)
Total Protein: 6.7 g/dL (ref 6.5–8.1)

## 2023-03-08 LAB — URINALYSIS, ROUTINE W REFLEX MICROSCOPIC
Bacteria, UA: NONE SEEN
Bilirubin Urine: NEGATIVE
Glucose, UA: NEGATIVE mg/dL
Hgb urine dipstick: NEGATIVE
Ketones, ur: 5 mg/dL — AB
Nitrite: NEGATIVE
Protein, ur: NEGATIVE mg/dL
Specific Gravity, Urine: 1.011 (ref 1.005–1.030)
pH: 6 (ref 5.0–8.0)

## 2023-03-08 LAB — LIPASE, BLOOD: Lipase: 29 U/L (ref 11–51)

## 2023-03-08 MED ORDER — SODIUM CHLORIDE 0.9 % IV BOLUS
1000.0000 mL | Freq: Once | INTRAVENOUS | Status: AC
  Administered 2023-03-08: 1000 mL via INTRAVENOUS

## 2023-03-08 MED ORDER — ACETAMINOPHEN 650 MG RE SUPP
650.0000 mg | Freq: Four times a day (QID) | RECTAL | Status: DC | PRN
Start: 2023-03-08 — End: 2023-03-10

## 2023-03-08 MED ORDER — DOCUSATE SODIUM 100 MG PO CAPS
100.0000 mg | ORAL_CAPSULE | Freq: Once | ORAL | Status: AC
Start: 2023-03-08 — End: 2023-03-08
  Administered 2023-03-08: 100 mg via ORAL
  Filled 2023-03-08: qty 1

## 2023-03-08 MED ORDER — ATORVASTATIN CALCIUM 20 MG PO TABS
40.0000 mg | ORAL_TABLET | Freq: Every day | ORAL | Status: DC
  Administered 2023-03-09 – 2023-03-10 (×2): 40 mg via ORAL
  Filled 2023-03-08 (×2): qty 2

## 2023-03-08 MED ORDER — POLYETHYLENE GLYCOL 3350 17 G PO PACK: 17.0000 g | PACK | Freq: Every day | ORAL | Status: DC | PRN

## 2023-03-08 MED ORDER — FLEET ENEMA RE ENEM: 1.0000 | ENEMA | Freq: Every day | RECTAL | Status: DC | PRN

## 2023-03-08 MED ORDER — TRAZODONE HCL 50 MG PO TABS
25.0000 mg | ORAL_TABLET | Freq: Every evening | ORAL | Status: DC | PRN
  Administered 2023-03-09: 25 mg via ORAL
  Filled 2023-03-08: qty 1

## 2023-03-08 MED ORDER — SENNA 8.6 MG PO TABS
1.0000 | ORAL_TABLET | Freq: Every day | ORAL | Status: DC
  Administered 2023-03-09 – 2023-03-10 (×2): 8.6 mg via ORAL
  Filled 2023-03-08 (×2): qty 1

## 2023-03-08 MED ORDER — MAGNESIUM CITRATE PO SOLN
1.0000 | Freq: Once | ORAL | Status: AC
  Administered 2023-03-08: 1 via ORAL
  Filled 2023-03-08: qty 296

## 2023-03-08 MED ORDER — ASPIRIN 81 MG PO TBEC
81.0000 mg | DELAYED_RELEASE_TABLET | Freq: Every day | ORAL | Status: DC
  Administered 2023-03-09 – 2023-03-10 (×2): 81 mg via ORAL
  Filled 2023-03-08 (×2): qty 1

## 2023-03-08 MED ORDER — ONDANSETRON HCL 4 MG/2ML IJ SOLN: 4.0000 mg | Freq: Four times a day (QID) | INTRAMUSCULAR | Status: DC | PRN

## 2023-03-08 MED ORDER — ACETAMINOPHEN 325 MG PO TABS
650.0000 mg | ORAL_TABLET | Freq: Four times a day (QID) | ORAL | Status: DC | PRN
  Administered 2023-03-08 – 2023-03-10 (×2): 650 mg via ORAL
  Filled 2023-03-08 (×2): qty 2

## 2023-03-08 MED ORDER — IOHEXOL 300 MG/ML  SOLN
100.0000 mL | Freq: Once | INTRAMUSCULAR | Status: AC | PRN
  Administered 2023-03-08: 100 mL via INTRAVENOUS

## 2023-03-08 MED ORDER — SODIUM CHLORIDE 0.9 % IV SOLN: INTRAVENOUS | Status: DC

## 2023-03-08 MED ORDER — PANTOPRAZOLE SODIUM 40 MG PO TBEC
40.0000 mg | DELAYED_RELEASE_TABLET | Freq: Every day | ORAL | Status: DC
  Administered 2023-03-09 – 2023-03-10 (×2): 40 mg via ORAL
  Filled 2023-03-08 (×2): qty 1

## 2023-03-08 MED ORDER — ONDANSETRON HCL 4 MG PO TABS: 4.0000 mg | ORAL_TABLET | Freq: Four times a day (QID) | ORAL | Status: DC | PRN

## 2023-03-08 MED ORDER — DICYCLOMINE HCL 10 MG PO CAPS: 10.0000 mg | ORAL_CAPSULE | Freq: Three times a day (TID) | ORAL | Status: DC

## 2023-03-08 MED ORDER — ENOXAPARIN SODIUM 40 MG/0.4ML IJ SOSY
40.0000 mg | PREFILLED_SYRINGE | INTRAMUSCULAR | Status: DC
  Administered 2023-03-09 – 2023-03-10 (×2): 40 mg via SUBCUTANEOUS
  Filled 2023-03-08 (×2): qty 0.4

## 2023-03-08 NOTE — ED Notes (Signed)
 Called CCMD for cardiac monitoring

## 2023-03-08 NOTE — ED Triage Notes (Signed)
Pt to ED for possible kidney stone. States has had generalized back pain since Tuesday. Saw PCP and had labs, xray and urine analysis done. States was told might have kidney stone and need CT.

## 2023-03-08 NOTE — H&P (Addendum)
[place]   PATIENT NAME: Shelly Armstrong    MR#:  [id number]  DATE OF BIRTH:  06/15/1945  DATE OF ADMISSION:  03/08/2023  PRIMARY CARE PHYSICIAN: Lauro Regulus, MD   Patient is coming from: Home  REQUESTING/REFERRING PHYSICIAN: Claudell Kyle, MD  CHIEF COMPLAINT:   Chief Complaint  Patient presents with   Back Pain    HISTORY OF PRESENT ILLNESS:  Shelly Armstrong is a 77 y.o. female with medical history significant for hypertension, dyslipidemia and GERD, who presented to the emergency room with acute onset of low back pain extending from both her sides since Monday with associated constipation.  She was seen by her PCP for constipation and was thought to have symptoms of diverticulitis for which she was given p.o. Cipro and Flagyl.  She has been having diminished appetite.  No nausea or vomiting.  No fever or chills.  No cough or wheezing or dyspnea.  No chest pain or palpitations.  She denies any recent diarrhea or melena or bright red bleeding per rectum.  She thought she was having indigestion.  No dysuria, oliguria or hematuria or flank pain.  ED Course: When she came to the ER, BP was 158/90 with otherwise normal vital signs.  Labs revealed hyponatremia 122 hypokalemia 88, AST 47 and calcium 8.8 with total bili 1.4 with otherwise unremarkable CMP CBC showed no significant abnormalities.  UA showed trace leukocytes with only 0-5 WBCs.  EKG as reviewed by me : None. Imaging: Abdominal pelvic CT scan showed no acute findings.  There was no evidence for urolithiasis or hydronephrosis.  There was large colonic stool burden noted concerning for constipation.  All PAST MEDICAL HISTORY:   Past Medical History:  Diagnosis Date   Chickenpox    Edema    feet/legs   Hyperglycemia    Hyperlipidemia    Hypertension    Osteopenia    PONV (postoperative nausea and vomiting)    after hand surgery    PAST SURGICAL HISTORY:   Past Surgical History:  Procedure  Laterality Date   ABDOMINAL HYSTERECTOMY     BREAST EXCISIONAL BIOPSY Left 2001   stereotactic biopsy - negative   BREAST SURGERY     Breast excisional biopsy   CATARACT EXTRACTION W/PHACO Right 10/19/2014   Procedure: CATARACT EXTRACTION PHACO AND INTRAOCULAR LENS PLACEMENT (IOC);  Surgeon: Galen Manila, MD;  Location: [facility] ORS;  Service: Ophthalmology;  Laterality: Right;  casette lot# [id number]   [place]  00:29 AP17.7 CDE 5.09   CATARACT EXTRACTION W/PHACO Left 11/02/2014   Procedure: CATARACT EXTRACTION PHACO AND INTRAOCULAR LENS PLACEMENT (IOC);  Surgeon: Galen Manila, MD;  Location: [facility] ORS;  Service: Ophthalmology;  Laterality: Left;  [place]: 00:34.2 AP%: 22.5 CDE: 7.71  Fluid lot# [id number]   CHOLECYSTECTOMY     COLONOSCOPY WITH PROPOFOL N/A 08/31/2014   Procedure: COLONOSCOPY WITH PROPOFOL;  Surgeon: Christena Deem, MD;  Location: [facility] ENDOSCOPY;  Service: Endoscopy;  Laterality: N/A;   ctr     ESOPHAGOGASTRODUODENOSCOPY N/A 08/31/2014   Procedure: ESOPHAGOGASTRODUODENOSCOPY (EGD);  Surgeon: Christena Deem, MD;  Location: [facility] ENDOSCOPY;  Service: Endoscopy;  Laterality: N/A;   ESOPHAGOGASTRODUODENOSCOPY (EGD) WITH PROPOFOL N/A 08/16/2020   Procedure: ESOPHAGOGASTRODUODENOSCOPY (EGD) WITH PROPOFOL;  Surgeon: Regis Bill, MD;  Location: [facility] ENDOSCOPY;  Service: Endoscopy;  Laterality: N/A;   FRACTURE SURGERY     Jaw fx repair; Hand procedure after trauma   MANDIBLE FRACTURE SURGERY     WRIST SURGERY  CTR    SOCIAL HISTORY:   Social History   Tobacco Use   Smoking status: Never   Smokeless tobacco: Never  Substance Use Topics   Alcohol use: No    FAMILY HISTORY:   Family History  Problem Relation Age of Onset   Heart failure Mother    CAD Father    Breast cancer Neg Hx     DRUG ALLERGIES:   Allergies  Allergen Reactions   Sulfamethoxazole-Trimethoprim Rash and Other (See Comments)    REVIEW OF SYSTEMS:   ROS As per history of present illness.  All pertinent systems were reviewed above. Constitutional, HEENT, cardiovascular, respiratory, GI, GU, musculoskeletal, neuro, psychiatric, endocrine, integumentary and hematologic systems were reviewed and are otherwise negative/unremarkable except for positive findings mentioned above in the HPI.   MEDICATIONS AT HOME:   Prior to Admission medications   Medication Sig Start Date End Date Taking? Authorizing Provider  acetaminophen (TYLENOL) 500 MG tablet Take 1,000 mg by mouth every 6 (six) hours as needed for moderate pain.    [provider]  aspirin 81 MG tablet Take 81 mg by mouth daily.    [provider]  atorvastatin (LIPITOR) 40 MG tablet Take 40 mg by mouth daily.    [provider]  Calcium-Vitamin D (CALTRATE 600 PLUS-VIT D PO) Take 1 tablet by mouth 2 (two) times daily.    [provider]  dicyclomine (BENTYL) 10 MG capsule Take 10 mg by mouth 4 (four) times daily -  before meals and at bedtime.    [provider]  Multiple Vitamins-Minerals (CENTRUM SILVER PO) Take 1 tablet by mouth daily.    [provider]  olmesartan-hydrochlorothiazide (BENICAR HCT) 40-25 MG tablet  08/04/18   [provider]  pantoprazole (PROTONIX) 40 MG tablet Take 40 mg by mouth daily.    [provider]  Polyethylene Glycol 3350-GRX POWD Take 17 g by mouth daily as needed.    [provider]  senna (SENOKOT) 8.6 MG TABS tablet Take 1 tablet by mouth.    [provider]      VITAL SIGNS:  Blood pressure (!) 175/68, pulse 60, temperature 98.1 F (36.7 C), temperature source Oral, resp. rate 18, height 5' (1.524 m), weight 72.6 kg, SpO2 100%.  PHYSICAL EXAMINATION:  Physical Exam  GENERAL:  77 y.o.-year-old patient lying in the bed with no acute distress.  EYES: Pupils equal, round, reactive to light and accommodation. No scleral icterus. Extraocular muscles intact.  HEENT: Head atraumatic, normocephalic.  Oropharynx and nasopharynx clear.  NECK:  Supple, no jugular venous distention. No thyroid enlargement, no tenderness.  LUNGS: Normal breath sounds bilaterally, no wheezing, rales,rhonchi or crepitation. No use of accessory muscles of respiration.  CARDIOVASCULAR: Regular rate and rhythm, S1, S2 normal. No murmurs, rubs, or gallops.  ABDOMEN: Soft, nondistended, nontender. Bowel sounds present. No organomegaly or mass.  EXTREMITIES: No pedal edema, cyanosis, or clubbing.  NEUROLOGIC: Cranial nerves II through XII are intact. Muscle strength 5/5 in all extremities. Sensation intact. Gait not checked.  PSYCHIATRIC: The patient is alert and oriented x 3.  Normal affect and good eye contact. SKIN: No obvious rash, lesion, or ulcer.   LABORATORY PANEL:   CBC Recent Labs  Lab 03/08/23 1335  WBC 6.8  HGB 12.5  HCT 34.5*  PLT 195   ------------------------------------------------------------------------------------------------------------------  Chemistries  Recent Labs  Lab 03/08/23 1335  NA 122*  K 3.7  CL 88*  CO2 22  GLUCOSE 112*  BUN  14  CREATININE 0.58  CALCIUM 8.8*  AST 47*  ALT 34  ALKPHOS 77  BILITOT 1.4*   ------------------------------------------------------------------------------------------------------------------  Cardiac Enzymes No results for input(s): "TROPONINI" in the last 168 hours. ------------------------------------------------------------------------------------------------------------------  RADIOLOGY:  CT ABDOMEN PELVIS W CONTRAST Result Date: 03/08/2023 CLINICAL DATA:  Flank and abdominal pain for several days. EXAM: CT ABDOMEN AND PELVIS WITH CONTRAST TECHNIQUE: Multidetector CT imaging of the abdomen and pelvis was performed using the standard protocol following bolus administration of intravenous contrast. RADIATION DOSE REDUCTION: This exam was performed according to the departmental dose-optimization program which includes automated exposure  control, adjustment of the mA and/or kV according to patient size and/or use of iterative reconstruction technique. CONTRAST:  OMNIPAQUE IOHEXOL 300 MG/ML  SOLN COMPARISON:  04/21/2020 FINDINGS: Lower Chest: No acute findings. Hepatobiliary: No suspicious hepatic masses identified. Chronic diffuse biliary ductal dilatation, without significant change. Pancreas: No mass or inflammatory changes. No evidence of pancreatic ductal dilatation. Spleen: Within normal limits in size and appearance. Adrenals/Urinary Tract: No suspicious masses identified. No evidence of ureteral calculi or hydronephrosis. Stomach/Bowel: No evidence of obstruction, inflammatory process or abnormal fluid collections. Normal appendix visualized. Large stool burden seen throughout the colon. Vascular/Lymphatic: No pathologically enlarged lymph nodes. No acute vascular findings. Reproductive: Prior hysterectomy noted. Adnexal regions are unremarkable in appearance. Other:  None. Musculoskeletal:  No suspicious bone lesions identified. IMPRESSION: No acute findings.  No evidence of urolithiasis or hydronephrosis. Large colonic stool burden noted; recommend clinical correlation for possible constipation. Electronically Signed   By: Danae Orleans M.D.   On: 03/08/2023 16:51      IMPRESSION AND PLAN:  Assessment and Plan: * Hyponatremia - This likely hypovolemic due to volume depletion and dehydration. - The patient be admitted to a medical telemetry bed. - We will obtain anemia workup. - She better with IV normal saline and will follow BMP.  Constipation - Aggressive management for constipation will be provided while she is here.  Essential hypertension - We will continue antihypertensive therapy.  GERD without esophagitis - We will continue PPI therapy.  Dyslipidemia - Continue statin therapy.       DVT prophylaxis: Lovenox.  Advanced Care Planning:  Code Status: full code.  Family Communication:  The plan of care  was discussed in details with the patient (and family). I answered all questions. The patient agreed to proceed with the above mentioned plan. Further management will depend upon hospital course. Disposition Plan: Back to previous home environment Consults called: none.  All the records are reviewed and case discussed with ED provider.  Status is: Inpatient    At the time of the admission, it appears that the appropriate admission status for this patient is inpatient.  This is judged to be reasonable and necessary in order to provide the required intensity of service to ensure the patient's safety given the presenting symptoms, physical exam findings and initial radiographic and laboratory data in the context of comorbid conditions.  The patient requires inpatient status due to high intensity of service, high risk of further deterioration and high frequency of surveillance required.  I certify that at the time of admission, it is my clinical judgment that the patient will require inpatient hospital care extending more than 2 midnights.                            Dispo: The patient is from: Home  Anticipated d/c is to: Home              Patient currently is not medically stable to d/c.              Difficult to place patient: No  Hannah Beat M.D on 03/09/2023 at 2:35 AM  [facility]   From 7 PM-7 AM, contact night-coverage [email or web address]  CC: Primary care physician; Lauro Regulus, MD

## 2023-03-08 NOTE — ED Provider Notes (Signed)
[facility] Provider Note    Event Date/Time   First MD Initiated Contact with Patient 03/08/23 2053     (approximate)   History   Back Pain   HPI Shelly Armstrong is a 77 y.o. female with history of HTN, HLD presenting today for abnormal labs.  Patient states she has had lower back pain for 4 to 5 days now which has been worsening.  Saw her primary care provider who was concerned for kidney stones or diverticulitis and told her to come to the ED for further evaluation.  States pain comes and goes.  Last bowel movement was 4 days ago.  No nausea or vomiting.  Does have decreased p.o. intake secondary to pain and constipation.  No chest pain or shortness of breath.  Reviewed outpatient labs with sodium of 127 2 days ago.     Physical Exam   Triage Vital Signs: ED Triage Vitals  Encounter Vitals Group     BP 03/08/23 1333 (!) 158/90     Systolic BP Percentile --      Diastolic BP Percentile --      Pulse Rate 03/08/23 1333 62     Resp 03/08/23 1333 18     Temp 03/08/23 1333 97.9 F (36.6 C)     Temp Source 03/08/23 1752 Oral     SpO2 03/08/23 1333 99 %     Weight 03/08/23 1333 160 lb (72.6 kg)     Height 03/08/23 1333 5' (1.524 m)     Head Circumference --      Peak Flow --      Pain Score 03/08/23 1333 7     Pain Loc --      Pain Education --      Exclude from Growth Chart --     Most recent vital signs: Vitals:   03/08/23 2124 03/08/23 2200  BP: (!) 175/74 (!) 175/68  Pulse: 63 60  Resp: 16 18  Temp:    SpO2: 100% 100%   Physical Exam: I have reviewed the vital signs and nursing notes. General: Awake, alert, no acute distress.  Nontoxic appearing. Head:  Atraumatic, normocephalic.   ENT:  EOM intact, PERRL. Oral mucosa is pink and moist with no lesions. Neck: Neck is supple with full range of motion, No meningeal signs. Cardiovascular:  RRR, No murmurs. Peripheral pulses palpable and equal bilaterally. Respiratory:  Symmetrical  chest wall expansion.  No rhonchi, rales, or wheezes.  Good air movement throughout.  No use of accessory muscles.   Musculoskeletal:  No cyanosis or edema. Moving extremities with full ROM Abdomen:  Soft, nontender, nondistended. Neuro:  GCS 15, moving all four extremities, interacting appropriately. Speech clear. Psych:  Calm, appropriate.   Skin:  Warm, dry, no rash.    ED Results / Procedures / Treatments   Labs (all labs ordered are listed, but only abnormal results are displayed) Labs Reviewed  COMPREHENSIVE METABOLIC PANEL - Abnormal; Notable for the following components:      Result Value   Sodium 122 (*)    Chloride 88 (*)    Glucose, Bld 112 (*)    Calcium 8.8 (*)    AST 47 (*)    Total Bilirubin 1.4 (*)    All other components within normal limits  CBC WITH DIFFERENTIAL/PLATELET - Abnormal; Notable for the following components:   RBC 3.70 (*)    HCT 34.5 (*)    MCHC 36.2 (*)    Lymphs Abs  0.6 (*)    All other components within normal limits  URINALYSIS, ROUTINE W REFLEX MICROSCOPIC - Abnormal; Notable for the following components:   Color, Urine YELLOW (*)    APPearance CLEAR (*)    Ketones, ur 5 (*)    Leukocytes,Ua TRACE (*)    All other components within normal limits  LIPASE, BLOOD  SODIUM, URINE, RANDOM     EKG    RADIOLOGY Independently interpreted CT imaging with only acute pathology noted to be large colonic stool burden throughout.  No stool ball in the rectal vault.   PROCEDURES:  Critical Care performed: Yes, see critical care procedure note(s)  .Critical Care  Performed by: Janith Lima, MD Authorized by: Janith Lima, MD   Critical care provider statement:    Critical care time (minutes):  30   Critical care was necessary to treat or prevent imminent or life-threatening deterioration of the following conditions:  Metabolic crisis (Profound hyponatremia)   Critical care was time spent personally by me on the following activities:   Development of treatment plan with patient or surrogate, discussions with consultants, evaluation of patient's response to treatment, examination of patient, ordering and review of laboratory studies, ordering and review of radiographic studies, ordering and performing treatments and interventions, pulse oximetry, re-evaluation of patient's condition and review of old charts   I assumed direction of critical care for this patient from another provider in my specialty: no     Care discussed with: admitting provider      MEDICATIONS ORDERED IN ED: Medications  iohexol (OMNIPAQUE) 300 MG/ML solution 100 mL (100 mLs Intravenous Contrast Given 03/08/23 1427)  sodium chloride 0.9 % bolus 1,000 mL (1,000 mLs Intravenous New Bag/Given 03/08/23 2204)  docusate sodium (COLACE) capsule 100 mg (100 mg Oral Given 03/08/23 2205)  magnesium citrate solution 1 Bottle (1 Bottle Oral Given 03/08/23 2204)     IMPRESSION / MDM / ASSESSMENT AND PLAN / ED COURSE  I reviewed the triage vital signs and the nursing notes.                              Differential diagnosis includes, but is not limited to, diverticulitis, nephrolithiasis, constipation, SBO, electrolyte abnormality, acute cystitis, pyelonephritis  Patient's presentation is most consistent with acute complicated illness / injury requiring diagnostic workup.  Patient is a 77 year old female presenting today for lower abdominal pain x 4 days and no bowel movement x 4 days.  Physical exam largely unremarkable and vital signs stable.  Most notably, laboratory workup shows profound hyponatremia with a sodium of 122.  Patient has no confusion no seizure-like activity reported at home.  CT abdomen/pelvis shows evidence of profound constipation but no obstruction or stool ball in the rectal vault.  Suspect sodium is likely due to dehydration as she has not tolerated a lot of p.o. recently given pain symptoms.  Will give her 1 L of normal saline at this time.   Will admit patient to hospitalist for ongoing sodium monitoring in order to slowly titrated back up.  Will also start her on a bowel regimen.  Hospitalist is agreed to admit patient for further care.      FINAL CLINICAL IMPRESSION(S) / ED DIAGNOSES   Final diagnoses:  Hyponatremia  Constipation, unspecified constipation type     Rx / DC Orders   ED Discharge Orders     None        Note:  This document was prepared using Dragon voice recognition software and may include unintentional dictation errors.   Janith Lima, MD 03/08/23 2219

## 2023-03-08 NOTE — H&P (Incomplete)
[place]   PATIENT NAME: Shelly Armstrong    MR#:  [id number]  DATE OF BIRTH:  [birth year]  DATE OF ADMISSION:  03/08/2023  PRIMARY CARE PHYSICIAN: Lauro Regulus, MD   Patient is coming from: Home  REQUESTING/REFERRING PHYSICIAN: Claudell Kyle, MD  CHIEF COMPLAINT:   Chief Complaint  Patient presents with  . Back Pain    HISTORY OF PRESENT ILLNESS:  Shelly Armstrong is a 77 y.o. female with medical history significant for hypertension, dyslipidemia and GERD, who presented to the emergency room with acute onset of ED Course: *** EKG as reviewed by me : *** Imaging: *** PAST MEDICAL HISTORY:   Past Medical History:  Diagnosis Date  . Chickenpox   . Edema    feet/legs  . Hyperglycemia   . Hyperlipidemia   . Hypertension   . Osteopenia   . PONV (postoperative nausea and vomiting)    after hand surgery    PAST SURGICAL HISTORY:   Past Surgical History:  Procedure Laterality Date  . ABDOMINAL HYSTERECTOMY    . BREAST EXCISIONAL BIOPSY Left 2001   stereotactic biopsy - negative  . BREAST SURGERY     Breast excisional biopsy  . CATARACT EXTRACTION W/PHACO Right 10/19/2014   Procedure: CATARACT EXTRACTION PHACO AND INTRAOCULAR LENS PLACEMENT (IOC);  Surgeon: Galen Manila, MD;  Location: [facility] ORS;  Service: Ophthalmology;  Laterality: Right;  casette lot# [id number]   [place]  00:29 AP17.7 CDE 5.09  . CATARACT EXTRACTION W/PHACO Left 11/02/2014   Procedure: CATARACT EXTRACTION PHACO AND INTRAOCULAR LENS PLACEMENT (IOC);  Surgeon: Galen Manila, MD;  Location: [facility] ORS;  Service: Ophthalmology;  Laterality: Left;  [place]: 00:34.2 AP%: 22.5 CDE: 7.71  Fluid lot# [id number]  . CHOLECYSTECTOMY    . COLONOSCOPY WITH PROPOFOL N/A 08/31/2014   Procedure: COLONOSCOPY WITH PROPOFOL;  Surgeon: Christena Deem, MD;  Location: [facility] ENDOSCOPY;  Service: Endoscopy;  Laterality: N/A;  . ctr    . ESOPHAGOGASTRODUODENOSCOPY N/A 08/31/2014   Procedure:  ESOPHAGOGASTRODUODENOSCOPY (EGD);  Surgeon: Christena Deem, MD;  Location: [facility] ENDOSCOPY;  Service: Endoscopy;  Laterality: N/A;  . ESOPHAGOGASTRODUODENOSCOPY (EGD) WITH PROPOFOL N/A 08/16/2020   Procedure: ESOPHAGOGASTRODUODENOSCOPY (EGD) WITH PROPOFOL;  Surgeon: Regis Bill, MD;  Location: [facility] ENDOSCOPY;  Service: Endoscopy;  Laterality: N/A;  . FRACTURE SURGERY     Jaw fx repair; Hand procedure after trauma  . MANDIBLE FRACTURE SURGERY    . WRIST SURGERY     CTR    SOCIAL HISTORY:   Social History   Tobacco Use  . Smoking status: Never  . Smokeless tobacco: Never  Substance Use Topics  . Alcohol use: No    FAMILY HISTORY:   Family History  Problem Relation Age of Onset  . Heart failure Mother   . CAD Father   . Breast cancer Neg Hx     DRUG ALLERGIES:   Allergies  Allergen Reactions  . Sulfamethoxazole-Trimethoprim Rash and Other (See Comments)    REVIEW OF SYSTEMS:   ROS As per history of present illness. All pertinent systems were reviewed above. Constitutional, HEENT, cardiovascular, respiratory, GI, GU, musculoskeletal, neuro, psychiatric, endocrine, integumentary and hematologic systems were reviewed and are otherwise negative/unremarkable except for positive findings mentioned above in the HPI.   MEDICATIONS AT HOME:   Prior to Admission medications   Medication Sig Start Date End Date Taking? Authorizing Provider  acetaminophen (TYLENOL) 500 MG tablet Take 1,000 mg by mouth every 6 (six) hours as needed for  moderate pain.    [provider]  aspirin 81 MG tablet Take 81 mg by mouth daily.    [provider]  atorvastatin (LIPITOR) 40 MG tablet Take 40 mg by mouth daily.    [provider]  Calcium-Vitamin D (CALTRATE 600 PLUS-VIT D PO) Take 1 tablet by mouth 2 (two) times daily.    [provider]  dicyclomine (BENTYL) 10 MG capsule Take 10 mg by mouth 4 (four) times daily -  before meals and at bedtime.     [provider]  Multiple Vitamins-Minerals (CENTRUM SILVER PO) Take 1 tablet by mouth daily.    [provider]  olmesartan-hydrochlorothiazide (BENICAR HCT) 40-25 MG tablet  08/04/18   [provider]  pantoprazole (PROTONIX) 40 MG tablet Take 40 mg by mouth daily.    [provider]  Polyethylene Glycol 3350-GRX POWD Take 17 g by mouth daily as needed.    [provider]  senna (SENOKOT) 8.6 MG TABS tablet Take 1 tablet by mouth.    [provider]      VITAL SIGNS:  Blood pressure (!) 175/68, pulse 60, temperature 98.1 F (36.7 C), temperature source Oral, resp. rate 18, height 5' (1.524 m), weight 72.6 kg, SpO2 100%.  PHYSICAL EXAMINATION:  Physical Exam  GENERAL:  77 y.o.-year-old patient lying in the bed with no acute distress.  EYES: Pupils equal, round, reactive to light and accommodation. No scleral icterus. Extraocular muscles intact.  HEENT: Head atraumatic, normocephalic. Oropharynx and nasopharynx clear.  NECK:  Supple, no jugular venous distention. No thyroid enlargement, no tenderness.  LUNGS: Normal breath sounds bilaterally, no wheezing, rales,rhonchi or crepitation. No use of accessory muscles of respiration.  CARDIOVASCULAR: Regular rate and rhythm, S1, S2 normal. No murmurs, rubs, or gallops.  ABDOMEN: Soft, nondistended, nontender. Bowel sounds present. No organomegaly or mass.  EXTREMITIES: No pedal edema, cyanosis, or clubbing.  NEUROLOGIC: Cranial nerves II through XII are intact. Muscle strength 5/5 in all extremities. Sensation intact. Gait not checked.  PSYCHIATRIC: The patient is alert and oriented x 3.  Normal affect and good eye contact. SKIN: No obvious rash, lesion, or ulcer.   LABORATORY PANEL:   CBC Recent Labs  Lab 03/08/23 1335  WBC 6.8  HGB 12.5  HCT 34.5*  PLT 195    ------------------------------------------------------------------------------------------------------------------  Chemistries  Recent Labs  Lab 03/08/23 1335  NA 122*  K 3.7  CL 88*  CO2 22  GLUCOSE 112*  BUN 14  CREATININE 0.58  CALCIUM 8.8*  AST 47*  ALT 34  ALKPHOS 77  BILITOT 1.4*   ------------------------------------------------------------------------------------------------------------------  Cardiac Enzymes No results for input(s): "TROPONINI" in the last 168 hours. ------------------------------------------------------------------------------------------------------------------  RADIOLOGY:  CT ABDOMEN PELVIS W CONTRAST Result Date: 03/08/2023 CLINICAL DATA:  Flank and abdominal pain for several days. EXAM: CT ABDOMEN AND PELVIS WITH CONTRAST TECHNIQUE: Multidetector CT imaging of the abdomen and pelvis was performed using the standard protocol following bolus administration of intravenous contrast. RADIATION DOSE REDUCTION: This exam was performed according to the departmental dose-optimization program which includes automated exposure control, adjustment of the mA and/or kV according to patient size and/or use of iterative reconstruction technique. CONTRAST:  OMNIPAQUE IOHEXOL 300 MG/ML  SOLN COMPARISON:  04/21/2020 FINDINGS: Lower Chest: No acute findings. Hepatobiliary: No suspicious hepatic masses identified. Chronic diffuse biliary ductal dilatation, without significant change. Pancreas: No mass or inflammatory changes. No evidence of pancreatic ductal dilatation. Spleen: Within normal limits in size and appearance. Adrenals/Urinary Tract: No suspicious  masses identified. No evidence of ureteral calculi or hydronephrosis. Stomach/Bowel: No evidence of obstruction, inflammatory process or abnormal fluid collections. Normal appendix visualized. Large stool burden seen throughout the colon. Vascular/Lymphatic: No pathologically enlarged lymph nodes. No acute  vascular findings. Reproductive: Prior hysterectomy noted. Adnexal regions are unremarkable in appearance. Other:  None. Musculoskeletal:  No suspicious bone lesions identified. IMPRESSION: No acute findings.  No evidence of urolithiasis or hydronephrosis. Large colonic stool burden noted; recommend clinical correlation for possible constipation. Electronically Signed   By: Danae Orleans M.D.   On: 03/08/2023 16:51      IMPRESSION AND PLAN:  Assessment and Plan: No notes have been filed under this hospital service. Service: Hospitalist      DVT prophylaxis: Lovenox***  Advanced Care Planning:  Code Status: full code***  Family Communication:  The plan of care was discussed in details with the patient (and family). I answered all questions. The patient agreed to proceed with the above mentioned plan. Further management will depend upon hospital course. Disposition Plan: Back to previous home environment Consults called: none***  All the records are reviewed and case discussed with ED provider.  Status is: Inpatient {Inpatient:23812}   At the time of the admission, it appears that the appropriate admission status for this patient is inpatient.  This is judged to be reasonable and necessary in order to provide the required intensity of service to ensure the patient's safety given the presenting symptoms, physical exam findings and initial radiographic and laboratory data in the context of comorbid conditions.  The patient requires inpatient status due to high intensity of service, high risk of further deterioration and high frequency of surveillance required.  I certify that at the time of admission, it is my clinical judgment that the patient will require inpatient hospital care extending more than 2 midnights.                            Dispo: The patient is from: Home              Anticipated d/c is to: Home              Patient currently is not medically stable to d/c.               Difficult to place patient: No  Hannah Beat M.D on 03/08/2023 at 10:27 PM  [facility]   From 7 PM-7 AM, contact night-coverage [email or web address]  CC: Primary care physician; Lauro Regulus, MD

## 2023-03-08 NOTE — ED Provider Triage Note (Addendum)
Emergency Medicine Provider Triage Evaluation Note  Shelly Armstrong , a 77 y.o. female  was evaluated in triage.  Pt complains of low back pain x1-2 days. Reports that she was seen wo days ago by PCP for abd pain and dx with diverticulitis, no imaging. Started on cipro/flagyl. Reports abd pain is better but now has low back pain. PCP thought she had kidney stone and ordered CT renal which she has not had. Patient reports pain is too severe to wait. No fever. +N/V. No trauma/falls. No dysuria/hematuria.  Review of Systems  Positive: Back pain Negative: fever  Physical Exam  There were no vitals taken for this visit. Gen:   Awake, no distress   Resp:  Normal effort  MSK:   Moves extremities without difficulty  Other:    Medical Decision Making  Medically screening exam initiated at 1:33 PM.  Appropriate orders placed.  Shelly Armstrong was informed that the remainder of the evaluation will be completed by another provider, this initial triage assessment does not replace that evaluation, and the importance of remaining in the ED until their evaluation is complete.     Jackelyn Hoehn, PA-C 03/08/23 1340

## 2023-03-09 DIAGNOSIS — I10 Essential (primary) hypertension: Secondary | ICD-10-CM | POA: Insufficient documentation

## 2023-03-09 DIAGNOSIS — K219 Gastro-esophageal reflux disease without esophagitis: Secondary | ICD-10-CM | POA: Diagnosis not present

## 2023-03-09 DIAGNOSIS — K5900 Constipation, unspecified: Secondary | ICD-10-CM

## 2023-03-09 DIAGNOSIS — E785 Hyperlipidemia, unspecified: Secondary | ICD-10-CM | POA: Diagnosis not present

## 2023-03-09 DIAGNOSIS — E871 Hypo-osmolality and hyponatremia: Secondary | ICD-10-CM | POA: Diagnosis not present

## 2023-03-09 DIAGNOSIS — E876 Hypokalemia: Secondary | ICD-10-CM

## 2023-03-09 LAB — BASIC METABOLIC PANEL
Anion gap: 9 (ref 5–15)
Anion gap: 9 (ref 5–15)
BUN: 13 mg/dL (ref 8–23)
BUN: 9 mg/dL (ref 8–23)
CO2: 19 mmol/L — ABNORMAL LOW (ref 22–32)
CO2: 23 mmol/L (ref 22–32)
Calcium: 8.1 mg/dL — ABNORMAL LOW (ref 8.9–10.3)
Calcium: 8.2 mg/dL — ABNORMAL LOW (ref 8.9–10.3)
Chloride: 93 mmol/L — ABNORMAL LOW (ref 98–111)
Chloride: 94 mmol/L — ABNORMAL LOW (ref 98–111)
Creatinine, Ser: 0.48 mg/dL (ref 0.44–1.00)
Creatinine, Ser: 0.68 mg/dL (ref 0.44–1.00)
GFR, Estimated: >60 mL/min (ref >60)
GFR, Estimated: >60 mL/min (ref >60)
Glucose, Bld: 101 mg/dL — ABNORMAL HIGH (ref 70–99)
Glucose, Bld: 118 mg/dL — ABNORMAL HIGH (ref 70–99)
Potassium: 3.4 mmol/L — ABNORMAL LOW (ref 3.5–5.1)
Potassium: 3.2 mmol/L — ABNORMAL LOW (ref 3.5–5.1)
Sodium: 121 mmol/L — ABNORMAL LOW (ref 135–145)
Sodium: 126 mmol/L — ABNORMAL LOW (ref 135–145)

## 2023-03-09 LAB — CBC
HCT: 31.5 % — ABNORMAL LOW (ref 36.0–46.0)
Hemoglobin: 11.4 g/dL — ABNORMAL LOW (ref 12.0–15.0)
MCH: 32.6 pg (ref 26.0–34.0)
MCHC: 36.2 g/dL — ABNORMAL HIGH (ref 30.0–36.0)
MCV: 90 fL (ref 80.0–100.0)
Platelets: 182 10*3/uL (ref 150–400)
RBC: 3.5 MIL/uL — ABNORMAL LOW (ref 3.87–5.11)
RDW: 11.9 % (ref 11.5–15.5)
WBC: 8 10*3/uL (ref 4.0–10.5)
nRBC: 0 % (ref 0.0–0.2)

## 2023-03-09 LAB — OSMOLALITY: Osmolality: 255 mosm/kg — ABNORMAL LOW (ref 275–295)

## 2023-03-09 LAB — SODIUM, URINE, RANDOM: Sodium, Ur: 95 mmol/L

## 2023-03-09 LAB — OSMOLALITY, URINE: Osmolality, Ur: 442 mosm/kg (ref 300–900)

## 2023-03-09 LAB — NA AND K (SODIUM & POTASSIUM), RAND UR
Potassium Urine: 42 mmol/L
Sodium, Ur: 94 mmol/L

## 2023-03-09 LAB — TSH: TSH: 2.372 u[IU]/mL (ref 0.350–4.500)

## 2023-03-09 MED ORDER — AMLODIPINE BESYLATE 5 MG PO TABS
5.0000 mg | ORAL_TABLET | Freq: Every day | ORAL | Status: DC
  Administered 2023-03-09 – 2023-03-10 (×2): 5 mg via ORAL
  Filled 2023-03-09 (×2): qty 1

## 2023-03-09 MED ORDER — POTASSIUM CHLORIDE CRYS ER 20 MEQ PO TBCR
40.0000 meq | EXTENDED_RELEASE_TABLET | Freq: Two times a day (BID) | ORAL | Status: AC
Start: 2023-03-09 — End: 2023-03-09
  Administered 2023-03-09 (×2): 40 meq via ORAL
  Filled 2023-03-09 (×2): qty 2

## 2023-03-09 NOTE — Assessment & Plan Note (Signed)
-   This likely hypovolemic due to volume depletion and dehydration. - The patient be admitted to a medical telemetry bed. - We will obtain anemia workup. - She better with IV normal saline and will follow BMP.

## 2023-03-09 NOTE — Assessment & Plan Note (Signed)
-   Aggressive management for constipation will be provided while she is here.

## 2023-03-09 NOTE — Progress Notes (Addendum)
[facility]  PROGRESS NOTE  Shelly Armstrong WUJ:[id number] DOB: 01-Jun-1945 DOA: 03/08/2023 PCP: Lauro Regulus, MD   Brief HPI:    77 y.o. female with medical history significant for hypertension, dyslipidemia and GERD, who presented to the emergency room with acute onset of low back pain extending from both her sides since Monday with associated constipation.  She was seen by her PCP for constipation and was thought to have symptoms of diverticulitis for which she was given p.o. Cipro and Flagyl.  She has been having diminished appetite.  Abdominal pelvic CT scan showed no acute findings.  There was no evidence for urolithiasis or hydronephrosis.  There was large colonic stool burden noted concerning for constipation.    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   Dyslipidemia - Continue statin therapy.    Medications     aspirin EC  81 mg Oral Daily   atorvastatin  40 mg Oral Daily   enoxaparin (LOVENOX) injection  40 mg Subcutaneous Q24H   pantoprazole  40 mg Oral Daily   potassium chloride  40 mEq Oral BID   senna  1 tablet Oral Daily     Data Reviewed:   CBG:  No results for input(s): "GLUCAP" in the last 168 hours.  SpO2: 100 %    Vitals:   03/09/23 0700 03/09/23 0943 03/09/23 1100 03/09/23 1300  BP: (!) 155/86 115/77 (!) 169/104 (!) 147/87  Pulse: 63  (!) 59 60 62  Resp: (!) 21 20 18 18   Temp: 98.4 F (36.9 C) (!) 97.3 F (36.3 C)    TempSrc: Oral Oral    SpO2: 100% 100% 100% 100%  Weight:      Height:          Data Reviewed:  Basic Metabolic Panel: Recent Labs  Lab 03/08/23 1335 03/09/23 0217 03/09/23 1125  NA 122* 121* 126*  K 3.7 3.4* 3.2*  CL 88* 93* 94*  CO2 22 19* 23  GLUCOSE 112* 101* 118*  BUN 14 13 9   CREATININE 0.58 0.48 0.68  CALCIUM 8.8* 8.1* 8.2*    CBC: Recent Labs  Lab 03/08/23 1335 03/09/23 0217  WBC 6.8 8.0  NEUTROABS 5.6  --   HGB 12.5 11.4*  HCT 34.5* 31.5*  MCV 93.2 90.0  PLT 195 182    LFT Recent Labs  Lab 03/08/23 1335  AST 47*  ALT 34  ALKPHOS 77  BILITOT 1.4*  PROT 6.7  ALBUMIN 4.2     Antibiotics: Anti-infectives (From admission, onward)    None        DVT prophylaxis: Lovenox  Code Status: Full code  Family Communication: No family at bedside   CONSULTS    Subjective   Denies any complaints   Objective    Physical Examination:   General-appears in no acute distress Heart-S1-S2, regular, no murmur auscultated Lungs-clear to auscultation bilaterally, no wheezing or crackles auscultated Abdomen-soft,  nontender, no organomegaly Extremities-no edema in the lower extremities Neuro-alert, oriented x3, no focal deficit noted  Status is: Inpatient:             Meredeth Ide   [facility] If 7PM-7AM, please contact night-coverage at [email or web address], Office  [phone]   03/09/2023, 2:34 PM  LOS: 1 day

## 2023-03-09 NOTE — Assessment & Plan Note (Signed)
Continue statin therapy.

## 2023-03-09 NOTE — Assessment & Plan Note (Signed)
-   We will continue anti-hypertensive therapy. 

## 2023-03-09 NOTE — Assessment & Plan Note (Signed)
-   We will continue PPI therapy 

## 2023-03-10 DIAGNOSIS — E871 Hypo-osmolality and hyponatremia: Secondary | ICD-10-CM | POA: Diagnosis not present

## 2023-03-10 LAB — BASIC METABOLIC PANEL
Anion gap: 6 (ref 5–15)
BUN: 17 mg/dL (ref 8–23)
CO2: 23 mmol/L (ref 22–32)
Calcium: 8.3 mg/dL — ABNORMAL LOW (ref 8.9–10.3)
Chloride: 102 mmol/L (ref 98–111)
Creatinine, Ser: 0.44 mg/dL (ref 0.44–1.00)
GFR, Estimated: >60 mL/min (ref >60)
Glucose, Bld: 112 mg/dL — ABNORMAL HIGH (ref 70–99)
Potassium: 4.5 mmol/L (ref 3.5–5.1)
Sodium: 131 mmol/L — ABNORMAL LOW (ref 135–145)

## 2023-03-10 LAB — CBC
HCT: 33.3 % — ABNORMAL LOW (ref 36.0–46.0)
Hemoglobin: 11.9 g/dL — ABNORMAL LOW (ref 12.0–15.0)
MCH: 32.6 pg (ref 26.0–34.0)
MCHC: 35.7 g/dL (ref 30.0–36.0)
MCV: 91.2 fL (ref 80.0–100.0)
Platelets: 215 10*3/uL (ref 150–400)
RBC: 3.65 MIL/uL — ABNORMAL LOW (ref 3.87–5.11)
RDW: 12.1 % (ref 11.5–15.5)
WBC: 5.8 10*3/uL (ref 4.0–10.5)
nRBC: 0 % (ref 0.0–0.2)

## 2023-03-10 NOTE — Progress Notes (Signed)
PT Cancellation Note  Patient Details Name: Shelly Armstrong MRN: [id number] DOB: May 11, 1945   Cancelled Treatment:    Reason Eval/Treat Not Completed: PT screened, no needs identified, will sign off. Per RN pt is being discharged, no PT needs.    [name] 03/10/2023, 11:11 AM

## 2023-03-10 NOTE — Hospital Course (Addendum)
77 y.o. F with obesity, HTN, HLD who presented with low back pain, constipation and weakness.  Found to have hyponatremia.  Seen recently by PCP, diagnosed clinically with diverticulitis and given Cipro/Flagyl.    Anorexia since then, came to the ER for weaknes, found to have Na 121, CT abdomen normal.

## 2023-03-10 NOTE — Discharge Summary (Signed)
Physician Discharge Summary   Patient: Shelly Armstrong MRN: [id number] DOB: 04/23/1945  Admit date:     03/08/2023  Discharge date: 03/10/23  Discharge Physician: Alberteen Sam   PCP: Lauro Regulus, MD     Recommendations at discharge:  Follow up with PCP Dr. Dareen Piano in 1 week for hyponatremia, hypertension Dr. Dareen Piano: BP was very labile here (low 100/70, high 170/105) without medication Coreg held temporarily at discharge Please review BP log and resume as appropriate Please check BMP in 1 week (discharge Na 131)     Discharge Diagnoses: Principal Problem:   Hyponatremia Active Problems:   Constipation   Obesity   Dyslipidemia   GERD without esophagitis   Essential hypertension      Hospital Course: 77 y.o. F with obesity, HTN, HLD who presented with low back pain, constipation and weakness.  Found to have hyponatremia.  Seen recently by PCP, diagnosed clinically with diverticulitis and given Cipro/Flagyl.    Anorexia since then, came to the ER for weaknes, found to have Na 121, CT abdomen normal.       * Hyponatremia due to SIADH Sodium 121 on admission.  Cipro, Flagyl, olmesartan, HCTZ, Coreg held.  She was given fluids for the first 24 hours sodium came up to 126.  She was recommended to have fluid restriction after that, sodium came up to 131.  Urine electrolytes suggest inappropriate ADH. Cannot really account for that, although it corrected spontaneously and she is completely asymptomatic.  Recommend repeat in 1 week.  Given she has been on HCTZ for "years", and sodium has been stable, I recommend she continue this at discharge for her hypertension.       Constipation Given bowel regimen and had good bowel movements.  This appears to be resolved.  Essential hypertension Patient's blood pressure was very labile here.  She was discharged on her ARB/HCTZ, Coreg was held.  Recommend blood pressure log, close PCP follow-up,  possibly ambulatory blood pressure monitoring.  Diverticulitis CT showed this was resolved.  Antibiotics stopped.  Back pain This resolved with bowel movement            The [facility] Controlled Substances Registry was reviewed for this patient prior to discharge.  Consultants: None Procedures performed: CT abdomen and pelvis  Disposition: Home Diet recommendation:  Discharge Diet Orders (From admission, onward)     Start     Ordered   03/10/23 0000  Diet - low sodium heart healthy        03/10/23 0920             DISCHARGE MEDICATION: Allergies as of 03/10/2023       Reactions   Sulfamethoxazole-trimethoprim Rash, Other (See Comments)        Medication List     PAUSE taking these medications    carvedilol 6.25 MG tablet Wait to take this until your doctor or other care provider tells you to start again. Commonly known as: COREG Take 6.25 mg by mouth 2 (two) times daily with a meal.       STOP taking these medications    ciprofloxacin 500 MG tablet Commonly known as: CIPRO   metroNIDAZOLE 500 MG tablet Commonly known as: FLAGYL       TAKE these medications    acetaminophen 500 MG tablet Commonly known as: TYLENOL Take 1,000 mg by mouth every 6 (six) hours as needed for moderate pain.   aspirin 81 MG tablet Take 81 mg by mouth daily.  atorvastatin 40 MG tablet Commonly known as: LIPITOR Take 40 mg by mouth daily.   CALTRATE 600 PLUS-VIT D PO Take 1 tablet by mouth 2 (two) times daily.   CENTRUM SILVER PO Take 1 tablet by mouth daily.   dicyclomine 10 MG capsule Commonly known as: BENTYL Take 10 mg by mouth 4 (four) times daily -  before meals and at bedtime.   olmesartan-hydrochlorothiazide 40-25 MG tablet Commonly known as: BENICAR HCT   pantoprazole 40 MG tablet Commonly known as: PROTONIX Take 40 mg by mouth daily.   Polyethylene Glycol 3350-GRX Powd Take 17 g by mouth daily as needed.   senna 8.6 MG Tabs  tablet Commonly known as: SENOKOT Take 1 tablet by mouth.        Follow-up Information     Lauro Regulus, MD. Schedule an appointment as soon as possible for a visit in 1 week(s).   Specialty: Internal Medicine Contact information: [address] [facility] [place] [address] [phone]                 Discharge Instructions     Diet - low sodium heart healthy   Complete by: As directed    Discharge instructions   Complete by: As directed    **IMPORTANT DISCHARGE INSTRUCTIONS**   From Dr. Maryfrances Bunnell: You were admitted for low sodium (hyponatremia)  Here, we just watched you, stopped the antibiotics, and helped you have a bowel movement and this corrected.  You may stop the Cipro and Flagyl.  Your CAT scan here showed no more evidence of diverticulitis.  Drink whatever you think is a normal amount of water/fluids at home  Resume all your home medicines EXCEPT hold carvedilol (for now)  Go see Dr. Dareen Piano in 1 week and have labs checked  Ask Dr. Dareen Piano about whether to restart Coreg or not   Increase activity slowly   Complete by: As directed        Discharge Exam: Filed Weights   03/08/23 1333  Weight: 72.6 kg    General: Pt is alert, awake, not in acute distress Cardiovascular: RRR, nl S1-S2, no murmurs appreciated.   No LE edema.   Respiratory: Normal respiratory rate and rhythm.  CTAB without rales or wheezes. Abdominal: Abdomen soft and non-tender.  No distension or HSM.   Neuro/Psych: Strength symmetric in upper and lower extremities.  Judgment and insight appear normal.   Condition at discharge: good  The results of significant diagnostics from this hospitalization (including imaging, microbiology, ancillary and laboratory) are listed below for reference.   Imaging Studies: CT ABDOMEN PELVIS W CONTRAST Result Date: 03/08/2023 CLINICAL DATA:  Flank and abdominal pain for several days. EXAM: CT ABDOMEN AND  PELVIS WITH CONTRAST TECHNIQUE: Multidetector CT imaging of the abdomen and pelvis was performed using the standard protocol following bolus administration of intravenous contrast. RADIATION DOSE REDUCTION: This exam was performed according to the departmental dose-optimization program which includes automated exposure control, adjustment of the mA and/or kV according to patient size and/or use of iterative reconstruction technique. CONTRAST:  OMNIPAQUE IOHEXOL 300 MG/ML  SOLN COMPARISON:  04/21/2020 FINDINGS: Lower Chest: No acute findings. Hepatobiliary: No suspicious hepatic masses identified. Chronic diffuse biliary ductal dilatation, without significant change. Pancreas: No mass or inflammatory changes. No evidence of pancreatic ductal dilatation. Spleen: Within normal limits in size and appearance. Adrenals/Urinary Tract: No suspicious masses identified. No evidence of ureteral calculi or hydronephrosis. Stomach/Bowel: No evidence of obstruction, inflammatory process  or abnormal fluid collections. Normal appendix visualized. Large stool burden seen throughout the colon. Vascular/Lymphatic: No pathologically enlarged lymph nodes. No acute vascular findings. Reproductive: Prior hysterectomy noted. Adnexal regions are unremarkable in appearance. Other:  None. Musculoskeletal:  No suspicious bone lesions identified. IMPRESSION: No acute findings.  No evidence of urolithiasis or hydronephrosis. Large colonic stool burden noted; recommend clinical correlation for possible constipation. Electronically Signed   By: Danae Orleans M.D.   On: 03/08/2023 16:51    Microbiology: No results found for this or any previous visit.  Labs: CBC: Recent Labs  Lab 03/08/23 1335 03/09/23 0217 03/10/23 0207  WBC 6.8 8.0 5.8  NEUTROABS 5.6  --   --   HGB 12.5 11.4* 11.9*  HCT 34.5* 31.5* 33.3*  MCV 93.2 90.0 91.2  PLT 195 182 215   Basic Metabolic Panel: Recent Labs  Lab 03/08/23 1335 03/09/23 0217  03/09/23 1125 03/10/23 0207  NA 122* 121* 126* 131*  K 3.7 3.4* 3.2* 4.5  CL 88* 93* 94* 102  CO2 22 19* 23 23  GLUCOSE 112* 101* 118* 112*  BUN 14 13 9 17   CREATININE 0.58 0.48 0.68 0.44  CALCIUM 8.8* 8.1* 8.2* 8.3*   Liver Function Tests: Recent Labs  Lab 03/08/23 1335  AST 47*  ALT 34  ALKPHOS 77  BILITOT 1.4*  PROT 6.7  ALBUMIN 4.2   CBG: No results for input(s): "GLUCAP" in the last 168 hours.  Discharge time spent: approximately 35 minutes spent on discharge counseling, evaluation of patient on day of discharge, and coordination of discharge planning with nursing, social work, pharmacy and case management  Signed: Alberteen Sam, MD [facility] 03/10/2023

## 2023-03-12 DIAGNOSIS — M40205 Unspecified kyphosis, thoracolumbar region: Secondary | ICD-10-CM | POA: Diagnosis not present

## 2023-03-12 DIAGNOSIS — M5126 Other intervertebral disc displacement, lumbar region: Secondary | ICD-10-CM | POA: Diagnosis not present

## 2023-03-12 DIAGNOSIS — M4805 Spinal stenosis, thoracolumbar region: Secondary | ICD-10-CM | POA: Diagnosis not present

## 2023-03-12 DIAGNOSIS — M4854XA Collapsed vertebra, not elsewhere classified, thoracic region, initial encounter for fracture: Secondary | ICD-10-CM | POA: Diagnosis not present

## 2023-03-12 DIAGNOSIS — M4804 Spinal stenosis, thoracic region: Secondary | ICD-10-CM | POA: Diagnosis not present

## 2023-03-12 DIAGNOSIS — M549 Dorsalgia, unspecified: Secondary | ICD-10-CM | POA: Diagnosis not present

## 2023-03-12 DIAGNOSIS — R29890 Loss of height: Secondary | ICD-10-CM | POA: Diagnosis not present

## 2023-03-14 ENCOUNTER — Ambulatory Visit
Admission: RE | Admit: 2023-03-14 | Discharge: 2023-03-14 | Disposition: A | Discharge status: Home / Self Care | Payer: PPO | Source: Ambulatory Visit | Attending: Internal Medicine | Admitting: Internal Medicine

## 2023-03-14 ENCOUNTER — Other Ambulatory Visit: Payer: Self-pay | Admitting: Internal Medicine

## 2023-03-14 DIAGNOSIS — M549 Dorsalgia, unspecified: Secondary | ICD-10-CM | POA: Diagnosis not present

## 2023-03-14 DIAGNOSIS — S22080A Wedge compression fracture of T11-T12 vertebra, initial encounter for closed fracture: Secondary | ICD-10-CM

## 2023-03-14 DIAGNOSIS — M40204 Unspecified kyphosis, thoracic region: Secondary | ICD-10-CM | POA: Diagnosis not present

## 2023-03-14 DIAGNOSIS — M47814 Spondylosis without myelopathy or radiculopathy, thoracic region: Secondary | ICD-10-CM | POA: Diagnosis not present

## 2023-03-19 DIAGNOSIS — R7303 Prediabetes: Secondary | ICD-10-CM | POA: Diagnosis not present

## 2023-03-19 DIAGNOSIS — E782 Mixed hyperlipidemia: Secondary | ICD-10-CM | POA: Diagnosis not present

## 2023-03-19 DIAGNOSIS — I10 Essential (primary) hypertension: Secondary | ICD-10-CM | POA: Diagnosis not present

## 2023-03-26 ENCOUNTER — Encounter: Payer: Self-pay | Admitting: Neurology

## 2023-03-26 ENCOUNTER — Other Ambulatory Visit: Payer: Self-pay | Admitting: Internal Medicine

## 2023-03-26 DIAGNOSIS — S22000A Wedge compression fracture of unspecified thoracic vertebra, initial encounter for closed fracture: Secondary | ICD-10-CM

## 2023-04-02 ENCOUNTER — Ambulatory Visit
Admission: RE | Admit: 2023-04-02 | Discharge: 2023-04-02 | Disposition: A | Discharge status: Home / Self Care | Payer: PPO | Source: Ambulatory Visit | Attending: Internal Medicine | Admitting: Internal Medicine

## 2023-04-02 DIAGNOSIS — S22000A Wedge compression fracture of unspecified thoracic vertebra, initial encounter for closed fracture: Secondary | ICD-10-CM | POA: Diagnosis not present

## 2023-04-02 DIAGNOSIS — M8008XA Age-related osteoporosis with current pathological fracture, vertebra(e), initial encounter for fracture: Secondary | ICD-10-CM | POA: Diagnosis not present

## 2023-04-02 HISTORY — PX: IR RADIOLOGIST EVAL & MGMT: IMG5224

## 2023-04-02 NOTE — Consult Note (Signed)
 Chief Complaint: Patient was seen in consultation today for back pain, T11 fracture at the request of Anderson,Marshall W  Referring Physician(s): Anderson,Marshall W  History of Present Illness: Shelly Armstrong is a 78 y.o. female Who is in her usual state of relatively good health and activity working as a conservation officer, nature at [employer] until approximately the ninth or 10 December when she began to notice spontaneous onset of lower thoracic spine pain.  The pain got so bad that she had to miss work and went to the emergency room.  After initially being diagnosed with constipation, further workup revealed a compression fracture of the T11 vertebra.  There is a subacute fracture of the superior endplate of T11 with less than 20% height loss.  Shelly Armstrong does have a known diagnosis of osteopenia diagnosed by DEXA in 2020.  Given the absence of trauma and the spontaneous occurrence, her diagnosis is most consistent with an osteoporotic compression fracture.  Her pain remains quite severe and is keeping her from being able to work.  She cannot be active or on her feet for more than 10 or 15 minutes without having to go and lie down.  She rates the severity of her pain as bad as a 9 out of 10 on a 10 point scale.  Additionally, her pain is disabling.  She scored a 17 out of 24 on the [employer] disability questionnaire.  He she was taking tramadol for pain but had to stop due to significant constipation.  She is highly motivated to undergo therapy as she wants to get back to her normal activity levels and being able to work again.  Past Medical History:  Diagnosis Date   Chickenpox    Edema    feet/legs   Hyperglycemia    Hyperlipidemia    Hypertension    Osteopenia    PONV (postoperative nausea and vomiting)    after hand surgery    Past Surgical History:  Procedure Laterality Date   ABDOMINAL HYSTERECTOMY     BREAST EXCISIONAL BIOPSY Left 2001   stereotactic biopsy - negative    BREAST SURGERY     Breast excisional biopsy   CATARACT EXTRACTION W/PHACO Right 10/19/2014   Procedure: CATARACT EXTRACTION PHACO AND INTRAOCULAR LENS PLACEMENT (IOC);  Surgeon: Elsie Carmine, MD;  Location: [facility] ORS;  Service: Ophthalmology;  Laterality: Right;  casette lot# [id number]   US   00:29 AP17.7 CDE 5.09   CATARACT EXTRACTION W/PHACO Left 11/02/2014   Procedure: CATARACT EXTRACTION PHACO AND INTRAOCULAR LENS PLACEMENT (IOC);  Surgeon: Elsie Carmine, MD;  Location: [facility] ORS;  Service: Ophthalmology;  Laterality: Left;  US : 00:34.2 AP%: 22.5 CDE: 7.71  Fluid lot# [id number]   CHOLECYSTECTOMY     COLONOSCOPY WITH PROPOFOL  N/A 08/31/2014   Procedure: COLONOSCOPY WITH PROPOFOL ;  Surgeon: Gladis RAYMOND Mariner, MD;  Location: [facility] ENDOSCOPY;  Service: Endoscopy;  Laterality: N/A;   ctr     ESOPHAGOGASTRODUODENOSCOPY N/A 08/31/2014   Procedure: ESOPHAGOGASTRODUODENOSCOPY (EGD);  Surgeon: Gladis RAYMOND Mariner, MD;  Location: [facility] ENDOSCOPY;  Service: Endoscopy;  Laterality: N/A;   ESOPHAGOGASTRODUODENOSCOPY (EGD) WITH PROPOFOL  N/A 08/16/2020   Procedure: ESOPHAGOGASTRODUODENOSCOPY (EGD) WITH PROPOFOL ;  Surgeon: Maryruth Ole DASEN, MD;  Location: [facility] ENDOSCOPY;  Service: Endoscopy;  Laterality: N/A;   FRACTURE SURGERY     Jaw fx repair; Hand procedure after trauma   MANDIBLE FRACTURE SURGERY     WRIST SURGERY     CTR    Allergies: Sulfamethoxazole-trimethoprim  Medications: Prior to Admission medications  Medication Sig Start Date End Date Taking? Authorizing Provider  acetaminophen  (TYLENOL ) 500 MG tablet Take 1,000 mg by mouth every 6 (six) hours as needed for moderate pain.   Yes [provider]  aspirin  81 MG tablet Take 81 mg by mouth daily.   Yes [provider]  atorvastatin  (LIPITOR) 40 MG tablet Take 40 mg by mouth daily.   Yes [provider]  Calcium -Vitamin D (CALTRATE 600 PLUS-VIT D PO) Take 1 tablet by mouth 2 (two) times daily.   Yes [provider]  carvedilol  (COREG ) 6.25 MG tablet Take 6.25 mg by mouth 2 (two) times daily with a meal. 02/27/23 02/27/24 Yes [provider]  dicyclomine  (BENTYL ) 10 MG capsule Take 10 mg by mouth 4 (four) times daily -  before meals and at bedtime.   Yes [provider]  Multiple Vitamins-Minerals (CENTRUM SILVER PO) Take 1 tablet by mouth daily.   Yes [provider]  olmesartan-hydrochlorothiazide  (BENICAR HCT) 40-25 MG tablet  08/04/18  Yes [provider]  pantoprazole  (PROTONIX ) 40 MG tablet Take 40 mg by mouth daily.   Yes [provider]  Polyethylene Glycol 3350 -GRX POWD Take 17 g by mouth daily as needed.   Yes [provider]  senna (SENOKOT) 8.6 MG TABS tablet Take 1 tablet by mouth.   Yes [provider]     Family History  Problem Relation Age of Onset   Heart failure Mother    CAD Father    Breast cancer Neg Hx     Social History   Socioeconomic History   Marital status: Married    Spouse name: Not on file   Number of children: Not on file   Years of education: Not on file   Highest education level: Not on file  Occupational History   Not on file  Tobacco Use   Smoking status: Never   Smokeless tobacco: Never  Vaping Use   Vaping status: Never Used  Substance and Sexual Activity   Alcohol use: No   Drug use: No   Sexual activity: Not on file  Other Topics Concern   Not on file  Social History Narrative   Lives with husband at home, independent and still active, working   Social Drivers of Corporate Investment Banker Strain: Low Risk  (09/26/2022)   Received from [facility]   Overall Financial Resource Strain (CARDIA)    Difficulty of Paying Living Expenses: Not hard at all  Food Insecurity: No Food Insecurity (09/26/2022)   Received from [facility]   Hunger Vital Sign    Worried About Running Out of Food in the Last Year: Never true    Ran Out of Food in  the Last Year: Never true  Transportation Needs: No Transportation Needs (09/26/2022)   Received from [facility]    In the past 12 months, has lack of transportation kept you from medical appointments or from getting medications?: No    Lack of Transportation (Non-Medical): No  Physical Activity: Not on file  Stress: Not on file  Social Connections: Not on file    Review of Systems: A 12 point ROS discussed and pertinent positives are indicated in the HPI above.  All other systems are negative.  Review of Systems  Vital Signs: There were no vitals taken for this visit.  Advance Care Plan: The advanced care plan/surrogate decision maker was discussed at the time  of visit and the patient did not wish to discuss or was not able to name a surrogate decision maker or provide an advance care plan.    Physical Exam Constitutional:      Appearance: Normal appearance.  HENT:     Head: Normocephalic and atraumatic.  Eyes:     General: No scleral icterus. Cardiovascular:     Rate and Rhythm: Normal rate.  Pulmonary:     Effort: Pulmonary effort is normal.  Abdominal:     General: There is no distension.     Palpations: Abdomen is soft.     Tenderness: There is no abdominal tenderness.  Musculoskeletal:       Back:     Comments: TTP at the T11 spinous process  Skin:    General: Skin is warm and dry.  Neurological:     Mental Status: She is alert and oriented to person, place, and time.  Psychiatric:        Mood and Affect: Mood normal.        Behavior: Behavior normal.      Imaging: MR THORACIC SPINE WO CONTRAST Result Date: 03/19/2023 CLINICAL DATA:  Mid upper back pain radiating to the abdomen. Symptoms for 1 week. EXAM: MRI THORACIC SPINE WITHOUT CONTRAST TECHNIQUE: Multiplanar, multisequence MR imaging of the thoracic spine was performed. No intravenous contrast was administered. COMPARISON:  None Available. FINDINGS:  Alignment:  Exaggerated thoracic kyphosis.  No listhesis. Vertebrae: Marrow edema in the upper half of the T11 body with mild superior endplate depression. No aggressive bone lesion. No evidence of spinal infection. Cord:  Normal signal and morphology. Paraspinal and other soft tissues: Diffuse atrophy of intrinsic back muscles. Mild paravertebral edema at the level of fracture. Disc levels: Disc desiccation and narrowing diffusely with endplate spurring that is mainly ventral. Up to mild facet spurring most notable at T10-11 on the left. No degenerative impingement. There is intervertebral collapse and ankylosis at T11-12. IMPRESSION: 1. Acute or subacute T11 compression fracture with mild height loss. No retropulsion or impingement. 2. Spondylosis and exaggerated thoracic kyphosis. No degenerative impingement. Electronically Signed   By: Dorn Roulette M.D.   On: 03/19/2023 08:07   CT ABDOMEN PELVIS W CONTRAST Result Date: 03/08/2023 CLINICAL DATA:  Flank and abdominal pain for several days. EXAM: CT ABDOMEN AND PELVIS WITH CONTRAST TECHNIQUE: Multidetector CT imaging of the abdomen and pelvis was performed using the standard protocol following bolus administration of intravenous contrast. RADIATION DOSE REDUCTION: This exam was performed according to the departmental dose-optimization program which includes automated exposure control, adjustment of the mA and/or kV according to patient size and/or use of iterative reconstruction technique. CONTRAST:  OMNIPAQUE  IOHEXOL  300 MG/ML  SOLN COMPARISON:  04/21/2020 FINDINGS: Lower Chest: No acute findings. Hepatobiliary: No suspicious hepatic masses identified. Chronic diffuse biliary ductal dilatation, without significant change. Pancreas: No mass or inflammatory changes. No evidence of pancreatic ductal dilatation. Spleen: Within normal limits in size and appearance. Adrenals/Urinary Tract: No suspicious masses identified. No evidence of ureteral calculi or  hydronephrosis. Stomach/Bowel: No evidence of obstruction, inflammatory process or abnormal fluid collections. Normal appendix visualized. Large stool burden seen throughout the colon. Vascular/Lymphatic: No pathologically enlarged lymph nodes. No acute vascular findings. Reproductive: Prior hysterectomy noted. Adnexal regions are unremarkable in appearance. Other:  None. Musculoskeletal:  No suspicious bone lesions identified. IMPRESSION: No acute findings.  No evidence of urolithiasis or hydronephrosis. Large colonic stool burden noted; recommend clinical correlation for possible constipation. Electronically Signed   By:  Norleen DELENA Kil M.D.   On: 03/08/2023 16:51    Labs:  CBC: Recent Labs    03/08/23 1335 03/09/23 0217 03/10/23 0207  WBC 6.8 8.0 5.8  HGB 12.5 11.4* 11.9*  HCT 34.5* 31.5* 33.3*  PLT 195 182 215    COAGS: No results for input(s): INR, APTT in the last 8760 hours.  BMP: Recent Labs    03/08/23 1335 03/09/23 0217 03/09/23 1125 03/10/23 0207  NA 122* 121* 126* 131*  K 3.7 3.4* 3.2* 4.5  CL 88* 93* 94* 102  CO2 22 19* 23 23  GLUCOSE 112* 101* 118* 112*  BUN 14 13 9 17   CALCIUM  8.8* 8.1* 8.2* 8.3*  CREATININE 0.58 0.48 0.68 0.44  GFRNONAA >60 >60 >60 >60    LIVER FUNCTION TESTS: Recent Labs    03/08/23 1335  BILITOT 1.4*  AST 47*  ALT 34  ALKPHOS 77  PROT 6.7  ALBUMIN 4.2    TUMOR MARKERS: No results for input(s): AFPTM, CEA, CA199, CHROMGRNA in the last 8760 hours.  Assessment & Plan:   Patient has suffered subacute osteoporotic fracture of the T11 vertebra.   History and exam have demonstrated the following:  Acute/Subacute fracture by imaging dated 03/14/23, Pain on exam concordant with level of fracture, Failure of conservative therapy and pain refractory to narcotic pain mediation, and Significant disability on the [employer] Disability Questionnaire with 17/24 positive symptoms, reflecting significant impact/impairment of  (ADLs)   ICD-10-CM Codes that Support Medical Necessity (welshblog.at.aspx?articleId=57630)  M80.08XA    Age-related osteoporosis with current pathological fracture, vertebra(e), initial encounter for fracture   Plan:  T11 vertebral body augmentation with balloon kyphoplasty  Post-procedure disposition: outpatient.  Medication holds: ASA  The patient has suffered a fracture of the T11 vertebral body. It is recommended that patients aged 50 years or older be evaluated for possible testing or treatment of osteoporosis. A copy of this consult report is sent to the patient's referring physician.  Advanced Care Plan: The patient did not want to provide an Advanced Care Plan at the time of this visit     Total time spent on today's visit was over 40 Minutes, including both face-to-face time and non face-to-face time, personally spent on review of chart (including labs and relevant imaging), discussing further workup and treatment options, referral to specialist if needed, reviewing outside records if pertinent, answering patient questions, and coordinating care regarding osteoporotic T11 fracture, initial as well as management strategy.   Electronically Signed: Wilkie POUR Ronica Vivian 04/02/2023, 10:46 AM

## 2023-04-03 DIAGNOSIS — R300 Dysuria: Secondary | ICD-10-CM | POA: Diagnosis not present

## 2023-04-03 LAB — PROTIME-INR
INR: 1.1
Prothrombin Time: 11.6 s — ABNORMAL HIGH (ref 9.0–11.5)

## 2023-04-04 ENCOUNTER — Other Ambulatory Visit: Payer: Self-pay | Admitting: Interventional Radiology

## 2023-04-04 DIAGNOSIS — M8008XA Age-related osteoporosis with current pathological fracture, vertebra(e), initial encounter for fracture: Secondary | ICD-10-CM

## 2023-04-09 ENCOUNTER — Ambulatory Visit
Admission: RE | Admit: 2023-04-09 | Discharge: 2023-04-09 | Disposition: A | Discharge status: Home / Self Care | Payer: PPO | Source: Ambulatory Visit | Attending: Interventional Radiology | Admitting: Interventional Radiology

## 2023-04-09 DIAGNOSIS — M8008XA Age-related osteoporosis with current pathological fracture, vertebra(e), initial encounter for fracture: Secondary | ICD-10-CM

## 2023-04-09 HISTORY — PX: IR KYPHO THORACIC WITH BONE BIOPSY: IMG5518

## 2023-04-09 MED ORDER — FENTANYL CITRATE PF 50 MCG/ML IJ SOSY
25.0000 ug | PREFILLED_SYRINGE | INTRAMUSCULAR | Status: DC | PRN
  Administered 2023-04-09 (×2): 25 ug via INTRAVENOUS

## 2023-04-09 MED ORDER — MIDAZOLAM HCL 2 MG/2ML IJ SOLN
1.0000 mg | INTRAMUSCULAR | Status: DC | PRN
  Administered 2023-04-09 (×2): 1 mg via INTRAVENOUS

## 2023-04-09 MED ORDER — CEFAZOLIN SODIUM-DEXTROSE 2-4 GM/100ML-% IV SOLN
2.0000 g | INTRAVENOUS | Status: AC
  Administered 2023-04-09: 2 g via INTRAVENOUS

## 2023-04-09 MED ORDER — SODIUM CHLORIDE 0.9 % IV SOLN: INTRAVENOUS | Status: DC

## 2023-04-09 MED ORDER — ONDANSETRON HCL 4 MG/2ML IJ SOLN: 4.0000 mg | Freq: Once | INTRAMUSCULAR | Status: DC

## 2023-04-09 MED ORDER — ACETAMINOPHEN 10 MG/ML IV SOLN
1000.0000 mg | Freq: Once | INTRAVENOUS | Status: AC
  Administered 2023-04-09: 1000 mg via INTRAVENOUS

## 2023-04-09 MED ORDER — KETOROLAC TROMETHAMINE 30 MG/ML IJ SOLN: 30.0000 mg | Freq: Once | INTRAMUSCULAR | Status: DC

## 2023-04-09 NOTE — Discharge Instructions (Addendum)
 Kyphoplasty Post Procedure Discharge Instructions ? ?May resume a regular diet and any medications that you routinely take (including pain medications). However, if you are taking Aspirin or an anticoagulant/blood thinner you will be told when you can resume taking these by the healthcare provider. ?No driving day of procedure. ?The day of your procedure take it easy. You may use an ice pack as needed to injection sites on back.  Ice to back 30 minutes on and 30 minutes off, as needed. ?May remove bandaids tomorrow after taking a shower. Replace daily with a clean bandaid until healed.  ?Do not lift anything heavier than a milk jug for 1-2 weeks or determined by your physician.  ?Follow up with your physician in 2 weeks. ? ? ? ?Please contact our office at [phone] for the following symptoms or if you have any questions: ? ?Fever greater than 100 degrees ?Increased swelling, pain, or redness at injection site. ?Increased back and/or leg pain ?New numbness or change in symptoms from before the procedure.  ? ? ?Thank you for visiting [facility].  ?

## 2023-04-09 NOTE — Progress Notes (Signed)
 Pt back in nursing recovery area. Pt still drowsy from procedure but will wake up when spoken to. Pt follows commands, talks in complete sentences and has no complaints at this time. Pt will remain in nurses station until discharged by Radiologist.

## 2023-04-10 ENCOUNTER — Telehealth: Payer: Self-pay

## 2023-04-10 NOTE — Telephone Encounter (Signed)
 Received phone call from pts sister, Shelly Armstrong, stating the patient was feeling dizzy, light headed, and nauseous this morning. The pt had a KP completed by Dr. Marne Sings at our facility yesterday and tolerated the procedure well. Pts sister reports her symptoms started this morning. [name] did check the patients BP at home which read 124/50 HR 55. Pt reporting pain slightly better at 4/10 in her back. I notified Dr. Marne Sings of this and he encouraged the patient to hydrate as much as possible and to seek medical attention if the symptoms do not improve soon or if they get worse. I explained this to the patients caregiver, encouraging water or something with electrolytes, and to seek medical attention if her symptoms do not improve or get worse and she verbalized understanding.

## 2023-04-16 DIAGNOSIS — M5489 Other dorsalgia: Secondary | ICD-10-CM | POA: Diagnosis not present

## 2023-04-16 DIAGNOSIS — M549 Dorsalgia, unspecified: Secondary | ICD-10-CM | POA: Diagnosis not present

## 2023-04-22 ENCOUNTER — Other Ambulatory Visit: Payer: Self-pay | Admitting: Interventional Radiology

## 2023-04-22 DIAGNOSIS — M8008XA Age-related osteoporosis with current pathological fracture, vertebra(e), initial encounter for fracture: Secondary | ICD-10-CM

## 2023-04-23 ENCOUNTER — Other Ambulatory Visit: Payer: Self-pay | Admitting: Internal Medicine

## 2023-04-23 DIAGNOSIS — Z1231 Encounter for screening mammogram for malignant neoplasm of breast: Secondary | ICD-10-CM

## 2023-04-25 ENCOUNTER — Ambulatory Visit
Admission: RE | Admit: 2023-04-25 | Discharge: 2023-04-25 | Disposition: A | Discharge status: Home / Self Care | Payer: PPO | Source: Ambulatory Visit | Attending: Interventional Radiology | Admitting: Interventional Radiology

## 2023-04-25 DIAGNOSIS — H35372 Puckering of macula, left eye: Secondary | ICD-10-CM | POA: Diagnosis not present

## 2023-04-25 DIAGNOSIS — H26493 Other secondary cataract, bilateral: Secondary | ICD-10-CM | POA: Diagnosis not present

## 2023-04-25 DIAGNOSIS — M8008XA Age-related osteoporosis with current pathological fracture, vertebra(e), initial encounter for fracture: Secondary | ICD-10-CM | POA: Diagnosis not present

## 2023-04-25 DIAGNOSIS — H43813 Vitreous degeneration, bilateral: Secondary | ICD-10-CM | POA: Diagnosis not present

## 2023-04-25 DIAGNOSIS — H02889 Meibomian gland dysfunction of unspecified eye, unspecified eyelid: Secondary | ICD-10-CM | POA: Diagnosis not present

## 2023-04-25 HISTORY — PX: IR RADIOLOGIST EVAL & MGMT: IMG5224

## 2023-04-25 NOTE — Progress Notes (Signed)
Chief Complaint: Patient was seen in consultation today for T11 osteoporotic compression fracture at the request of Graesyn Schreifels K  Referring Physician(s): Sharon Stapel K  History of Present Illness: Shelly Armstrong is a 78 y.o. female With a history of T11 osteoporotic compression fracture.  She underwent cement augmentation with balloon kyphoplasty on 04/09/2023.  She and her sister came into the clinic to visit with me today.  She is pleased to report that her pain has essentially completely resolved.  She had some mild tenderness last week and had some radiographs performed which demonstrated no issues.  That pain has since resolved.  She has not taken her muscle relaxers or pain medicines since this past Sunday.  She is very pleased with her improvement.  Past Medical History:  Diagnosis Date   Chickenpox    Edema    feet/legs   Hyperglycemia    Hyperlipidemia    Hypertension    Osteopenia    PONV (postoperative nausea and vomiting)    after hand surgery    Past Surgical History:  Procedure Laterality Date   ABDOMINAL HYSTERECTOMY     BREAST EXCISIONAL BIOPSY Left 2001   stereotactic biopsy - negative   BREAST SURGERY     Breast excisional biopsy   CATARACT EXTRACTION W/PHACO Right 10/19/2014   Procedure: CATARACT EXTRACTION PHACO AND INTRAOCULAR LENS PLACEMENT (IOC);  Surgeon: Galen Manila, MD;  Location: [facility] ORS;  Service: Ophthalmology;  Laterality: Right;  casette lot# [id number]   [place]  00:29 AP17.7 CDE 5.09   CATARACT EXTRACTION W/PHACO Left 11/02/2014   Procedure: CATARACT EXTRACTION PHACO AND INTRAOCULAR LENS PLACEMENT (IOC);  Surgeon: Galen Manila, MD;  Location: [facility] ORS;  Service: Ophthalmology;  Laterality: Left;  [place]: 00:34.2 AP%: 22.5 CDE: 7.71  Fluid lot# [id number]   CHOLECYSTECTOMY     COLONOSCOPY WITH PROPOFOL N/A 08/31/2014   Procedure: COLONOSCOPY WITH PROPOFOL;  Surgeon: Christena Deem, MD;  Location: [facility] ENDOSCOPY;  Service:  Endoscopy;  Laterality: N/A;   ctr     ESOPHAGOGASTRODUODENOSCOPY N/A 08/31/2014   Procedure: ESOPHAGOGASTRODUODENOSCOPY (EGD);  Surgeon: Christena Deem, MD;  Location: [facility]COPY;  Service: Endoscopy;  Laterality: N/A;   ESOPHAGOGASTRODUODENOSCOPY (EGD) WITH PROPOFOL N/A 08/16/2020   Procedure: ESOPHAGOGASTRODUODENOSCOPY (EGD) WITH PROPOFOL;  Surgeon: Regis Bill, MD;  Location: [facility] ENDOSCOPY;  Service: Endoscopy;  Laterality: N/A;   FRACTURE SURGERY     Jaw fx repair; Hand procedure after trauma   IR KYPHO THORACIC WITH BONE BIOPSY  04/09/2023   IR RADIOLOGIST EVAL & MGMT  04/02/2023   IR RADIOLOGIST EVAL & MGMT  04/25/2023   MANDIBLE FRACTURE SURGERY     WRIST SURGERY     CTR    Allergies: Sulfamethoxazole-trimethoprim  Medications: Prior to Admission medications   Medication Sig Start Date End Date Taking? Authorizing Provider  acetaminophen (TYLENOL) 500 MG tablet Take 1,000 mg by mouth every 6 (six) hours as needed for moderate pain.    [provider]  aspirin 81 MG tablet Take 81 mg by mouth daily.    [provider]  atorvastatin (LIPITOR) 40 MG tablet Take 40 mg by mouth daily.    [provider]  Calcium-Vitamin D (CALTRATE 600 PLUS-VIT D PO) Take 1 tablet by mouth 2 (two) times daily.    [provider]  carvedilol (COREG) 6.25 MG tablet Take 6.25 mg by mouth 2 (two) times daily with a meal. 02/27/23 02/27/24  [provider]  dicyclomine (BENTYL) 10 MG capsule Take 10  mg by mouth 4 (four) times daily -  before meals and at bedtime.    [provider]  Multiple Vitamins-Minerals (CENTRUM SILVER PO) Take 1 tablet by mouth daily.    [provider]  olmesartan-hydrochlorothiazide (BENICAR HCT) 40-25 MG tablet  08/04/18   [provider]  pantoprazole (PROTONIX) 40 MG tablet Take 40 mg by mouth daily.    [provider]  Polyethylene Glycol 3350-GRX POWD Take 17 g by mouth daily as needed.     [provider]  senna (SENOKOT) 8.6 MG TABS tablet Take 1 tablet by mouth.    [provider]     Family History  Problem Relation Age of Onset   Heart failure Mother    CAD Father    Breast cancer Neg Hx     Social History   Socioeconomic History   Marital status: Married    Spouse name: Not on file   Number of children: Not on file   Years of education: Not on file   Highest education level: Not on file  Occupational History   Not on file  Tobacco Use   Smoking status: Never   Smokeless tobacco: Never  Vaping Use   Vaping status: Never Used  Substance and Sexual Activity   Alcohol use: No   Drug use: No   Sexual activity: Not on file  Other Topics Concern   Not on file  Social History Narrative   Lives with husband at home, independent and still active, working   Social Drivers of Corporate investment banker Strain: Low Risk  (09/26/2022)   Received from [facility]   Overall Financial Resource Strain (CARDIA)    Difficulty of Paying Living Expenses: Not hard at all  Food Insecurity: No Food Insecurity (09/26/2022)   Received from [facility]   Hunger Vital Sign    Worried About Running Out of Food in the Last Year: Never true    Ran Out of Food in the Last Year: Never true  Transportation Needs: No Transportation Needs (09/26/2022)   Received from [facility]    In the past 12 months, has lack of transportation kept you from medical appointments or from getting medications?: No    Lack of Transportation (Non-Medical): No  Physical Activity: Not on file  Stress: Not on file  Social Connections: Not on file   Review of Systems: A 12 point ROS discussed and pertinent positives are indicated in the HPI above.  All other systems are negative.  Review of Systems  Vital Signs: Temp 98 F (36.7 C) (Oral)   SpO2 99% Comment: room air  Advance Care Plan: The advanced  care plan/surrogate decision maker was discussed at the time of visit and the patient did not wish to discuss or was not able to name a surrogate decision maker or provide an advance care plan.    Physical Exam Constitutional:      General: She is not in acute distress.    Appearance: Normal appearance. She is normal weight.  HENT:     Head: Normocephalic and atraumatic.  Eyes:     General: No scleral icterus. Cardiovascular:     Rate and Rhythm: Normal rate.  Pulmonary:     Effort: Pulmonary effort is normal.  Abdominal:     General: Abdomen is flat. There is no distension.     Tenderness: There is no abdominal tenderness.  Musculoskeletal:  Comments: Well healed incisions, no erythema, induration or fluctuance.   Skin:    General: Skin is warm and dry.  Neurological:     Mental Status: She is alert and oriented to person, place, and time.  Psychiatric:        Mood and Affect: Mood normal.        Behavior: Behavior normal.       Imaging: IR Radiologist Eval & Mgmt Result Date: 04/25/2023 EXAM: ESTABLISHED PATIENT OFFICE VISIT CHIEF COMPLAINT: SEE NOTE IN EPIC HISTORY OF PRESENT ILLNESS: SEE NOTE IN EPIC REVIEW OF SYSTEMS: SEE NOTE IN EPIC PHYSICAL EXAMINATION: SEE NOTE IN EPIC ASSESSMENT AND PLAN: SEE NOTE IN EPIC Electronically Signed   By: Malachy Moan M.D.   On: 04/25/2023 11:16   IR KYPHO THORACIC WITH BONE BIOPSY Result Date: 04/09/2023 CLINICAL DATA:  78 year old female with highly symptomatic osteoporotic compression fracture of the T11 vertebral body. EXAM: FLUOROSCOPIC GUIDED KYPHOPLASTY OF THE T11 VERTEBRAL BODY COMPARISON:  None Available. MEDICATIONS: As antibiotic prophylaxis, 2 g Ancef was ordered pre-procedure and administered intravenously within 1 hour of incision. ANESTHESIA/SEDATION: Moderate (conscious) sedation was employed during this procedure. A total of Versed 2 mg and Fentanyl 50 mcg was administered intravenously by the Radiology nurse.  Moderate Sedation Time: 36 minutes. The patient's level of consciousness and vital signs were monitored continuously by radiology nursing throughout the procedure under my direct supervision. FLUOROSCOPY TIME:  Radiation exposure index: 41.1 mGy reference air kerma COMPLICATIONS: None immediate. PROCEDURE: The procedure, risks (including but not limited to bleeding, infection, organ damage), benefits, and alternatives were explained to the patient. Questions regarding the procedure were encouraged and answered. The patient understands and consents to the procedure. The patient has suffered a fracture of the T11 vertebral body. It is recommended that patients aged 28 years or older be evaluated for possible testing or treatment of osteoporosis. A copy of this procedure report is sent to the patient's referring physician The patient was placed prone on the fluoroscopic table. The skin overlying the thoracic region was then prepped and draped in the usual sterile fashion. Maximal barrier sterile technique was utilized including caps, mask, sterile gowns, sterile gloves, sterile drape, hand hygiene and skin antiseptic. Intravenous Fentanyl and Versed were administered as conscious sedation during continuous cardiorespiratory monitoring by the radiology RN. The left pedicle at T11 was then infiltrated with 1% lidocaine followed by the advancement of a Kyphon trocar needle through the left pedicle into the posterior one-third of the vertebral body. Subsequently, the osteo drill was advanced to the anterior third of the vertebral body. The osteo drill was retracted. Through the working cannula, a Kyphon inflatable bone tamp 15 x 2.5 was advanced and positioned with the distal marker approximately 5 mm from the anterior aspect of the cortex. Appropriate positioning was confirmed on the AP projection. At this time, the balloon was expanded using contrast via a Kyphon inflation syringe device via micro tubing. In similar  fashion, the right T11 pedicle was infiltrated with 1% lidocaine followed by the advancement of a second Kyphon trocar needle through the right pedicle into the posterior third of the vertebral body. Subsequently, the osteo drill was coaxially advanced to the anterior right third. The osteo drill was exchanged for a Kyphon inflatable bone tamp 15 x 2.5, advanced to the 5 mm of the anterior aspect of the cortex. The balloon was then expanded using contrast as above. Inflations were continued until there was near apposition with the superior end plate.  At this time, methylmethacrylate mixture was reconstituted in the Kyphon bone mixing device system. This was then loaded into the delivery mechanism, attached to Kyphon bone fillers. The balloons were deflated and removed followed by the instillation of methylmethacrylate mixture with excellent filling in the AP and lateral projections. No extravasation was noted in the disk spaces or posteriorly into the spinal canal. No epidural venous contamination was seen. The working cannulae and the bone filler were then retrieved and removed. Hemostasis was achieved with manual compression. The patient tolerated the procedure well without immediate postprocedural complication. IMPRESSION: 1. Technically successful T11 vertebral body augmentation using balloon kyphoplasty. 2. Per CMS PQRS reporting requirements (PQRS Measure 24): Given the patient's age of greater than 50 and the fracture site (hip, distal radius, or spine), the patient should be tested for osteoporosis using DXA, and the appropriate treatment considered based on the DXA results. Electronically Signed   By: Malachy Moan M.D.   On: 04/09/2023 12:24   IR Radiologist Eval & Mgmt Result Date: 04/02/2023 EXAM: NEW PATIENT OFFICE VISIT CHIEF COMPLAINT: SEE NOTE IN EPIC HISTORY OF PRESENT ILLNESS: SEE NOTE IN EPIC REVIEW OF SYSTEMS: SEE NOTE IN EPIC PHYSICAL EXAMINATION: SEE NOTE IN EPIC ASSESSMENT AND PLAN: SEE  NOTE IN EPIC Electronically Signed   By: Malachy Moan M.D.   On: 04/02/2023 10:44    Labs:  CBC: Recent Labs    03/08/23 1335 03/09/23 0217 03/10/23 0207  WBC 6.8 8.0 5.8  HGB 12.5 11.4* 11.9*  HCT 34.5* 31.5* 33.3*  PLT 195 182 215    COAGS: Recent Labs    04/02/23 1100  INR 1.1    BMP: Recent Labs    03/08/23 1335 03/09/23 0217 03/09/23 1125 03/10/23 0207  NA 122* 121* 126* 131*  K 3.7 3.4* 3.2* 4.5  CL 88* 93* 94* 102  CO2 22 19* 23 23  GLUCOSE 112* 101* 118* 112*  BUN 14 13 9 17   CALCIUM 8.8* 8.1* 8.2* 8.3*  CREATININE 0.58 0.48 0.68 0.44  GFRNONAA >60 >60 >60 >60    LIVER FUNCTION TESTS: Recent Labs    03/08/23 1335  BILITOT 1.4*  AST 47*  ALT 34  ALKPHOS 77  PROT 6.7  ALBUMIN 4.2    TUMOR MARKERS: No results for input(s): "AFPTM", "CEA", "CA199", "CHROMGRNA" in the last 8760 hours.  Assessment and Plan:  78 year old female doing extremely well 2 weeks status post cement augmentation of T11 with balloon kyphoplasty.  She has completely recovered.  Her pain is resolved and she feels much better.  We discussed her returning to work on February 17.  She should avoid bending and flexing.  She currently works as a Conservation officer, nature for [employer] which necessitates she do these maneuvers.  We discussed that it would be better for her if she could cover the self checkout area.  That way, she could still perform her duties with limited bending and twisting.  She is safe to resume exercise such as walking on the treadmill, water aerobics and physical therapy.  No further follow-up with IR at this time.    Electronically Signed: Sterling Big 04/25/2023, 12:43 PM   I spent a total of  15 Minutes in face to face in clinical consultation, greater than 50% of which was counseling/coordinating care for T11 fracture.

## 2023-05-23 DIAGNOSIS — M170 Bilateral primary osteoarthritis of knee: Secondary | ICD-10-CM | POA: Diagnosis not present

## 2023-05-23 DIAGNOSIS — M1120 Other chondrocalcinosis, unspecified site: Secondary | ICD-10-CM | POA: Diagnosis not present

## 2023-05-23 DIAGNOSIS — M25462 Effusion, left knee: Secondary | ICD-10-CM | POA: Diagnosis not present

## 2023-05-23 DIAGNOSIS — G8929 Other chronic pain: Secondary | ICD-10-CM | POA: Diagnosis not present

## 2023-05-23 DIAGNOSIS — M76899 Other specified enthesopathies of unspecified lower limb, excluding foot: Secondary | ICD-10-CM | POA: Diagnosis not present

## 2023-06-12 ENCOUNTER — Ambulatory Visit
Admission: RE | Admit: 2023-06-12 | Discharge: 2023-06-12 | Disposition: A | Discharge status: Home / Self Care | Payer: PPO | Source: Ambulatory Visit | Attending: Internal Medicine | Admitting: Internal Medicine

## 2023-06-12 DIAGNOSIS — Z1231 Encounter for screening mammogram for malignant neoplasm of breast: Secondary | ICD-10-CM | POA: Diagnosis not present

## 2023-06-25 DIAGNOSIS — R1010 Upper abdominal pain, unspecified: Secondary | ICD-10-CM | POA: Diagnosis not present

## 2023-06-25 DIAGNOSIS — R531 Weakness: Secondary | ICD-10-CM | POA: Diagnosis not present

## 2023-06-25 DIAGNOSIS — R634 Abnormal weight loss: Secondary | ICD-10-CM | POA: Diagnosis not present

## 2023-07-04 DIAGNOSIS — E871 Hypo-osmolality and hyponatremia: Secondary | ICD-10-CM | POA: Diagnosis not present

## 2023-07-04 DIAGNOSIS — K5900 Constipation, unspecified: Secondary | ICD-10-CM | POA: Diagnosis not present

## 2023-07-04 DIAGNOSIS — R634 Abnormal weight loss: Secondary | ICD-10-CM | POA: Diagnosis not present

## 2023-07-04 DIAGNOSIS — R194 Change in bowel habit: Secondary | ICD-10-CM | POA: Diagnosis not present

## 2023-07-08 ENCOUNTER — Emergency Department

## 2023-07-08 ENCOUNTER — Inpatient Hospital Stay
Admission: EM | Admit: 2023-07-08 | Discharge: 2023-07-16 | DRG: 481 | Disposition: A | Discharge status: Skilled Nursing Facility | Attending: Internal Medicine | Admitting: Internal Medicine

## 2023-07-08 ENCOUNTER — Other Ambulatory Visit: Payer: Self-pay

## 2023-07-08 ENCOUNTER — Encounter: Payer: Self-pay | Admitting: Emergency Medicine

## 2023-07-08 DIAGNOSIS — E440 Moderate protein-calorie malnutrition: Secondary | ICD-10-CM | POA: Diagnosis present

## 2023-07-08 DIAGNOSIS — W010XXA Fall on same level from slipping, tripping and stumbling without subsequent striking against object, initial encounter: Secondary | ICD-10-CM | POA: Diagnosis present

## 2023-07-08 DIAGNOSIS — M1611 Unilateral primary osteoarthritis, right hip: Secondary | ICD-10-CM | POA: Diagnosis not present

## 2023-07-08 DIAGNOSIS — I10 Essential (primary) hypertension: Secondary | ICD-10-CM | POA: Diagnosis present

## 2023-07-08 DIAGNOSIS — I160 Hypertensive urgency: Secondary | ICD-10-CM | POA: Diagnosis present

## 2023-07-08 DIAGNOSIS — D62 Acute posthemorrhagic anemia: Secondary | ICD-10-CM | POA: Diagnosis not present

## 2023-07-08 DIAGNOSIS — D631 Anemia in chronic kidney disease: Secondary | ICD-10-CM | POA: Diagnosis not present

## 2023-07-08 DIAGNOSIS — R0902 Hypoxemia: Secondary | ICD-10-CM | POA: Diagnosis not present

## 2023-07-08 DIAGNOSIS — N179 Acute kidney failure, unspecified: Secondary | ICD-10-CM | POA: Diagnosis not present

## 2023-07-08 DIAGNOSIS — M25551 Pain in right hip: Secondary | ICD-10-CM | POA: Diagnosis not present

## 2023-07-08 DIAGNOSIS — W19XXXA Unspecified fall, initial encounter: Secondary | ICD-10-CM | POA: Diagnosis not present

## 2023-07-08 DIAGNOSIS — S72141A Displaced intertrochanteric fracture of right femur, initial encounter for closed fracture: Principal | ICD-10-CM | POA: Diagnosis present

## 2023-07-08 DIAGNOSIS — R531 Weakness: Secondary | ICD-10-CM | POA: Diagnosis not present

## 2023-07-08 DIAGNOSIS — S72009A Fracture of unspecified part of neck of unspecified femur, initial encounter for closed fracture: Secondary | ICD-10-CM | POA: Diagnosis not present

## 2023-07-08 DIAGNOSIS — Z7982 Long term (current) use of aspirin: Secondary | ICD-10-CM

## 2023-07-08 DIAGNOSIS — Z9842 Cataract extraction status, left eye: Secondary | ICD-10-CM

## 2023-07-08 DIAGNOSIS — E871 Hypo-osmolality and hyponatremia: Secondary | ICD-10-CM | POA: Diagnosis not present

## 2023-07-08 DIAGNOSIS — R109 Unspecified abdominal pain: Secondary | ICD-10-CM | POA: Diagnosis not present

## 2023-07-08 DIAGNOSIS — Z9841 Cataract extraction status, right eye: Secondary | ICD-10-CM

## 2023-07-08 DIAGNOSIS — F419 Anxiety disorder, unspecified: Secondary | ICD-10-CM | POA: Diagnosis present

## 2023-07-08 DIAGNOSIS — K5900 Constipation, unspecified: Secondary | ICD-10-CM | POA: Diagnosis not present

## 2023-07-08 DIAGNOSIS — Z881 Allergy status to other antibiotic agents status: Secondary | ICD-10-CM

## 2023-07-08 DIAGNOSIS — E785 Hyperlipidemia, unspecified: Secondary | ICD-10-CM | POA: Diagnosis present

## 2023-07-08 DIAGNOSIS — M898X5 Other specified disorders of bone, thigh: Secondary | ICD-10-CM

## 2023-07-08 DIAGNOSIS — K5903 Drug induced constipation: Secondary | ICD-10-CM | POA: Diagnosis present

## 2023-07-08 DIAGNOSIS — Z472 Encounter for removal of internal fixation device: Secondary | ICD-10-CM | POA: Diagnosis not present

## 2023-07-08 DIAGNOSIS — Z8249 Family history of ischemic heart disease and other diseases of the circulatory system: Secondary | ICD-10-CM

## 2023-07-08 DIAGNOSIS — S72001A Fracture of unspecified part of neck of right femur, initial encounter for closed fracture: Secondary | ICD-10-CM | POA: Diagnosis not present

## 2023-07-08 DIAGNOSIS — D649 Anemia, unspecified: Secondary | ICD-10-CM | POA: Diagnosis not present

## 2023-07-08 DIAGNOSIS — Z6824 Body mass index (BMI) 24.0-24.9, adult: Secondary | ICD-10-CM

## 2023-07-08 DIAGNOSIS — M79604 Pain in right leg: Secondary | ICD-10-CM | POA: Diagnosis not present

## 2023-07-08 DIAGNOSIS — Z882 Allergy status to sulfonamides status: Secondary | ICD-10-CM

## 2023-07-08 DIAGNOSIS — Z9049 Acquired absence of other specified parts of digestive tract: Secondary | ICD-10-CM

## 2023-07-08 DIAGNOSIS — Z90710 Acquired absence of both cervix and uterus: Secondary | ICD-10-CM

## 2023-07-08 DIAGNOSIS — Z961 Presence of intraocular lens: Secondary | ICD-10-CM | POA: Diagnosis present

## 2023-07-08 DIAGNOSIS — R634 Abnormal weight loss: Secondary | ICD-10-CM | POA: Diagnosis not present

## 2023-07-08 DIAGNOSIS — Z79899 Other long term (current) drug therapy: Secondary | ICD-10-CM

## 2023-07-08 DIAGNOSIS — M8580 Other specified disorders of bone density and structure, unspecified site: Secondary | ICD-10-CM | POA: Diagnosis present

## 2023-07-08 MED ORDER — MORPHINE SULFATE (PF) 4 MG/ML IV SOLN
4.0000 mg | Freq: Once | INTRAVENOUS | Status: AC
  Administered 2023-07-08: 4 mg via INTRAVENOUS
  Filled 2023-07-08: qty 1

## 2023-07-08 MED ORDER — ONDANSETRON HCL 4 MG/2ML IJ SOLN
4.0000 mg | Freq: Once | INTRAMUSCULAR | Status: AC
  Administered 2023-07-08: 4 mg via INTRAVENOUS
  Filled 2023-07-08: qty 2

## 2023-07-08 NOTE — ED Triage Notes (Signed)
 Pt from home with right hip pain after falling inside of her home, denies LOC, hitting head, thinners; pt given 100mcg Fentanyl and 4mg  Zofran en route; v/s 220/100, HR63, 99% RA, 20RR

## 2023-07-08 NOTE — ED Notes (Signed)
 Pt reports hx of HTN, takes Losartan, has not missed any doses

## 2023-07-08 NOTE — ED Provider Notes (Signed)
 [facility] Provider Note    Event Date/Time   First MD Initiated Contact with Patient 07/08/23 2156     (approximate)   History   Hip Pain   HPI  Shelly Armstrong is a 78 y.o. female with history of hypertension, hyperlipidemia, GERD, presenting with right hip and right femur pain after mechanical fall tonight.  Per EMS patient tripped while falling inside her house, denies LOC or hitting her head.  She denies being on any blood thinners.  Was given 100 mcg of fentanyl and 4 mg of Zofran en route.  Patient denies hitting her head to me, states no pain anywhere else except for the right hip and right lateral thigh.  States that she was wearing socks and was walking on carpet, tripped over her socks and landed on the carpet.  Independent history obtained from EMS as above.  On independent chart review, she was seen by her primary care doctor today for constipation, she is on a baby aspirin but no blood thinners are listed on her med list, does have history of generalized weakness as well as hyponatremia.     Physical Exam   Triage Vital Signs: ED Triage Vitals  Encounter Vitals Group     BP 07/08/23 2150 (!) 224/86     Systolic BP Percentile --      Diastolic BP Percentile --      Pulse Rate 07/08/23 2150 66     Resp 07/08/23 2150 18     Temp 07/08/23 2150 98 F (36.7 C)     Temp Source 07/08/23 2150 Oral     SpO2 07/08/23 2150 100 %     Weight 07/08/23 2149 131 lb (59.4 kg)     Height 07/08/23 2149 5\' 1"  (1.549 m)     Head Circumference --      Peak Flow --      Pain Score 07/08/23 2149 7     Pain Loc --      Pain Education --      Exclude from Growth Chart --     Most recent vital signs: Vitals:   07/08/23 2150  BP: (!) 224/86  Pulse: 66  Resp: 18  Temp: 98 F (36.7 C)  SpO2: 100%     General: Awake, no distress.  CV:  Good peripheral perfusion.  Resp:  Normal effort.  No thoracic cage tenderness, no respiratory distress or  increased work of breathing Abd:  No distention.  Soft nontender Other:  No palpable skull deformities or tenderness, no midline spinal tenderness, no tenderness to the bilateral upper extremities into the left lower extremity, she has equal radial pulses bilaterally, grip strength as well as dorsi and plantarflexion's are equal bilaterally, no sensory deficits.  She is equal PT pulses bilaterally.  She does have tenderness to the right posterior hip as well as right lateral thigh without overlying ecchymoses or erythema.  Able to fully range her bilateral upper extremities as well as the left lower extremity, has limited range of motion to her right hip, able to range her right knee as well as right ankle.   ED Results / Procedures / Treatments   Labs (all labs ordered are listed, but only abnormal results are displayed) Labs Reviewed - No data to display    PROCEDURES:  Critical Care performed: No  Procedures   MEDICATIONS ORDERED IN ED: Medications  morphine (PF) 4 MG/ML injection 4 mg (4 mg Intravenous Patient Refused/Not Given  07/08/23 2202)  ondansetron (ZOFRAN) injection 4 mg (4 mg Intravenous Given 07/08/23 2242)     IMPRESSION / MDM / ASSESSMENT AND PLAN / ED COURSE  I reviewed the triage vital signs and the nursing notes.                              Differential diagnosis includes, but is not limited to, fracture, strain, sprain, contusion.  Will get x-ray of her hip and femur.  IV pain meds.  Shared decision done with family as well as patient and they are agreeable plan.  Patient's presentation is most consistent with acute presentation with potential threat to life or bodily function.  Independent review of imaging below.  Patient signed out to oncoming team pending x-ray imaging results.      FINAL CLINICAL IMPRESSION(S) / ED DIAGNOSES   Final diagnoses:  Right hip pain  Pain in right femur  Fall, initial encounter     Rx / DC Orders   ED Discharge  Orders     None        Note:  This document was prepared using Dragon voice recognition software and may include unintentional dictation errors.    Shane Darling, MD 07/08/23 [phone]

## 2023-07-08 NOTE — Progress Notes (Signed)
 Imaging reviewed  Patient has right intertrochanteric femur fracture Will need intramedullary nail Plan for tentative surgery tomorrow 07/09/2023 Will review injury and surgical options with patient in morning and fill out consent at that time Hold chemical AC for OR Admit to medicine  NPO after midnight Full consult to follow  Venus Ginsberg MD

## 2023-07-08 NOTE — ED Provider Notes (Signed)
-----------------------------------------   11:39 PM on 07/08/2023 -----------------------------------------   Updated patient and family member on x-ray results demonstrating right IT fracture.  Have discussed case with orthopedist on-call Dr. Clyda Dark.  Will obtain lab work and consult hospitalist services for evaluation and admission.  ----------------------------------------- 12:50 AM on 07/09/2023 -----------------------------------------   Laboratory results demonstrate hyponatremia with sodium 124 which patient has had previously.  Will administer IV fluids.  Consult hospitalist services for evaluation and admission.   Clemie General J, MD 07/09/23 [phone]

## 2023-07-09 ENCOUNTER — Encounter
Admission: EM | Disposition: A | Discharge status: Skilled Nursing Facility | Payer: Self-pay | Source: Home / Self Care | Attending: Internal Medicine

## 2023-07-09 ENCOUNTER — Other Ambulatory Visit: Payer: Self-pay

## 2023-07-09 ENCOUNTER — Encounter: Payer: Self-pay | Admitting: Internal Medicine

## 2023-07-09 ENCOUNTER — Inpatient Hospital Stay

## 2023-07-09 ENCOUNTER — Inpatient Hospital Stay: Admitting: Anesthesiology

## 2023-07-09 DIAGNOSIS — Z8249 Family history of ischemic heart disease and other diseases of the circulatory system: Secondary | ICD-10-CM | POA: Diagnosis not present

## 2023-07-09 DIAGNOSIS — Z90710 Acquired absence of both cervix and uterus: Secondary | ICD-10-CM | POA: Diagnosis not present

## 2023-07-09 DIAGNOSIS — S72141A Displaced intertrochanteric fracture of right femur, initial encounter for closed fracture: Secondary | ICD-10-CM | POA: Diagnosis not present

## 2023-07-09 DIAGNOSIS — E871 Hypo-osmolality and hyponatremia: Secondary | ICD-10-CM | POA: Diagnosis not present

## 2023-07-09 DIAGNOSIS — W19XXXD Unspecified fall, subsequent encounter: Secondary | ICD-10-CM | POA: Diagnosis not present

## 2023-07-09 DIAGNOSIS — E785 Hyperlipidemia, unspecified: Secondary | ICD-10-CM | POA: Diagnosis not present

## 2023-07-09 DIAGNOSIS — Z9049 Acquired absence of other specified parts of digestive tract: Secondary | ICD-10-CM | POA: Diagnosis not present

## 2023-07-09 DIAGNOSIS — Z882 Allergy status to sulfonamides status: Secondary | ICD-10-CM | POA: Diagnosis not present

## 2023-07-09 DIAGNOSIS — Z961 Presence of intraocular lens: Secondary | ICD-10-CM | POA: Diagnosis not present

## 2023-07-09 DIAGNOSIS — S72009A Fracture of unspecified part of neck of unspecified femur, initial encounter for closed fracture: Secondary | ICD-10-CM | POA: Diagnosis present

## 2023-07-09 DIAGNOSIS — Z7982 Long term (current) use of aspirin: Secondary | ICD-10-CM | POA: Diagnosis not present

## 2023-07-09 DIAGNOSIS — M8580 Other specified disorders of bone density and structure, unspecified site: Secondary | ICD-10-CM | POA: Diagnosis not present

## 2023-07-09 DIAGNOSIS — E440 Moderate protein-calorie malnutrition: Secondary | ICD-10-CM | POA: Diagnosis not present

## 2023-07-09 DIAGNOSIS — K5903 Drug induced constipation: Secondary | ICD-10-CM | POA: Diagnosis not present

## 2023-07-09 DIAGNOSIS — I160 Hypertensive urgency: Secondary | ICD-10-CM

## 2023-07-09 DIAGNOSIS — K5900 Constipation, unspecified: Secondary | ICD-10-CM | POA: Diagnosis not present

## 2023-07-09 DIAGNOSIS — K219 Gastro-esophageal reflux disease without esophagitis: Secondary | ICD-10-CM | POA: Diagnosis not present

## 2023-07-09 DIAGNOSIS — F419 Anxiety disorder, unspecified: Secondary | ICD-10-CM | POA: Diagnosis not present

## 2023-07-09 DIAGNOSIS — S72141D Displaced intertrochanteric fracture of right femur, subsequent encounter for closed fracture with routine healing: Secondary | ICD-10-CM | POA: Diagnosis not present

## 2023-07-09 DIAGNOSIS — Z6824 Body mass index (BMI) 24.0-24.9, adult: Secondary | ICD-10-CM | POA: Diagnosis not present

## 2023-07-09 DIAGNOSIS — Z79899 Other long term (current) drug therapy: Secondary | ICD-10-CM | POA: Diagnosis not present

## 2023-07-09 DIAGNOSIS — I10 Essential (primary) hypertension: Secondary | ICD-10-CM | POA: Diagnosis not present

## 2023-07-09 DIAGNOSIS — W19XXXA Unspecified fall, initial encounter: Secondary | ICD-10-CM | POA: Diagnosis not present

## 2023-07-09 DIAGNOSIS — D62 Acute posthemorrhagic anemia: Secondary | ICD-10-CM | POA: Diagnosis not present

## 2023-07-09 DIAGNOSIS — Z881 Allergy status to other antibiotic agents status: Secondary | ICD-10-CM | POA: Diagnosis not present

## 2023-07-09 DIAGNOSIS — W010XXA Fall on same level from slipping, tripping and stumbling without subsequent striking against object, initial encounter: Secondary | ICD-10-CM | POA: Diagnosis present

## 2023-07-09 DIAGNOSIS — Z7401 Bed confinement status: Secondary | ICD-10-CM | POA: Diagnosis not present

## 2023-07-09 DIAGNOSIS — Z9842 Cataract extraction status, left eye: Secondary | ICD-10-CM | POA: Diagnosis not present

## 2023-07-09 DIAGNOSIS — Z9841 Cataract extraction status, right eye: Secondary | ICD-10-CM | POA: Diagnosis not present

## 2023-07-09 DIAGNOSIS — I959 Hypotension, unspecified: Secondary | ICD-10-CM | POA: Diagnosis not present

## 2023-07-09 HISTORY — PX: INTRAMEDULLARY (IM) NAIL INTERTROCHANTERIC: SHX5875

## 2023-07-09 LAB — CBC
HCT: 31.5 % — ABNORMAL LOW (ref 36.0–46.0)
HCT: 32.7 % — ABNORMAL LOW (ref 36.0–46.0)
Hemoglobin: 11.6 g/dL — ABNORMAL LOW (ref 12.0–15.0)
Hemoglobin: 11.7 g/dL — ABNORMAL LOW (ref 12.0–15.0)
MCH: 32.9 pg (ref 26.0–34.0)
MCH: 33.9 pg (ref 26.0–34.0)
MCHC: 35.8 g/dL (ref 30.0–36.0)
MCHC: 36.8 g/dL — ABNORMAL HIGH (ref 30.0–36.0)
MCV: 91.9 fL (ref 80.0–100.0)
MCV: 92.1 fL (ref 80.0–100.0)
Platelets: 149 10*3/uL — ABNORMAL LOW (ref 150–400)
Platelets: 196 10*3/uL (ref 150–400)
RBC: 3.42 MIL/uL — ABNORMAL LOW (ref 3.87–5.11)
RBC: 3.56 MIL/uL — ABNORMAL LOW (ref 3.87–5.11)
RDW: 12.8 % (ref 11.5–15.5)
RDW: 12.8 % (ref 11.5–15.5)
WBC: 11.3 10*3/uL — ABNORMAL HIGH (ref 4.0–10.5)
WBC: 6.2 10*3/uL (ref 4.0–10.5)
nRBC: 0 % (ref 0.0–0.2)
nRBC: 0 % (ref 0.0–0.2)

## 2023-07-09 LAB — TSH: TSH: 2.521 u[IU]/mL (ref 0.350–4.500)

## 2023-07-09 LAB — TROPONIN I (HIGH SENSITIVITY): Troponin I (High Sensitivity): 6 ng/L (ref <18)

## 2023-07-09 LAB — BASIC METABOLIC PANEL WITH GFR
Anion gap: 7 (ref 5–15)
Anion gap: 9 (ref 5–15)
BUN: 14 mg/dL (ref 8–23)
BUN: 14 mg/dL (ref 8–23)
CO2: 23 mmol/L (ref 22–32)
CO2: 24 mmol/L (ref 22–32)
Calcium: 8.6 mg/dL — ABNORMAL LOW (ref 8.9–10.3)
Calcium: 9 mg/dL (ref 8.9–10.3)
Chloride: 92 mmol/L — ABNORMAL LOW (ref 98–111)
Chloride: 92 mmol/L — ABNORMAL LOW (ref 98–111)
Creatinine, Ser: 0.43 mg/dL — ABNORMAL LOW (ref 0.44–1.00)
Creatinine, Ser: 0.49 mg/dL (ref 0.44–1.00)
GFR, Estimated: >60 mL/min (ref >60)
GFR, Estimated: >60 mL/min (ref >60)
Glucose, Bld: 123 mg/dL — ABNORMAL HIGH (ref 70–99)
Glucose, Bld: 221 mg/dL — ABNORMAL HIGH (ref 70–99)
Potassium: 3.6 mmol/L (ref 3.5–5.1)
Potassium: 3.8 mmol/L (ref 3.5–5.1)
Sodium: 123 mmol/L — ABNORMAL LOW (ref 135–145)
Sodium: 124 mmol/L — ABNORMAL LOW (ref 135–145)

## 2023-07-09 SURGERY — FIXATION, FRACTURE, INTERTROCHANTERIC, WITH INTRAMEDULLARY ROD
Anesthesia: Spinal | Site: Hip | Laterality: Right

## 2023-07-09 MED ORDER — METHOCARBAMOL 1000 MG/10ML IJ SOLN: 500.0000 mg | Freq: Four times a day (QID) | INTRAMUSCULAR | Status: DC | PRN

## 2023-07-09 MED ORDER — BUPIVACAINE HCL (PF) 0.5 % IJ SOLN
INTRAMUSCULAR | Status: DC | PRN
Start: 2023-07-09 — End: 2023-07-09
  Administered 2023-07-09: 2 mL

## 2023-07-09 MED ORDER — MORPHINE SULFATE (PF) 4 MG/ML IV SOLN
4.0000 mg | INTRAVENOUS | Status: DC | PRN
  Administered 2023-07-10 – 2023-07-11 (×5): 4 mg via INTRAVENOUS
  Filled 2023-07-09 (×5): qty 1

## 2023-07-09 MED ORDER — OXYCODONE HCL 5 MG PO TABS: 5.0000 mg | ORAL_TABLET | Freq: Once | ORAL | Status: DC | PRN

## 2023-07-09 MED ORDER — CEFAZOLIN SODIUM-DEXTROSE 2-4 GM/100ML-% IV SOLN
INTRAVENOUS | Status: AC
  Filled 2023-07-09: qty 100

## 2023-07-09 MED ORDER — ACETAMINOPHEN 500 MG PO TABS
1000.0000 mg | ORAL_TABLET | Freq: Four times a day (QID) | ORAL | Status: DC | PRN
  Administered 2023-07-13: 1000 mg via ORAL
  Filled 2023-07-09: qty 2

## 2023-07-09 MED ORDER — MENTHOL 3 MG MT LOZG: 1.0000 | LOZENGE | OROMUCOSAL | Status: DC | PRN

## 2023-07-09 MED ORDER — PHENYLEPHRINE 80 MCG/ML (10ML) SYRINGE FOR IV PUSH (FOR BLOOD PRESSURE SUPPORT)
PREFILLED_SYRINGE | INTRAVENOUS | Status: AC
  Filled 2023-07-09: qty 10

## 2023-07-09 MED ORDER — DROPERIDOL 2.5 MG/ML IJ SOLN
0.6250 mg | Freq: Once | INTRAMUSCULAR | Status: AC | PRN
  Administered 2023-07-09: 0.625 mg via INTRAVENOUS

## 2023-07-09 MED ORDER — DROPERIDOL 2.5 MG/ML IJ SOLN
INTRAMUSCULAR | Status: AC
  Filled 2023-07-09: qty 2

## 2023-07-09 MED ORDER — HYDROCODONE-ACETAMINOPHEN 5-325 MG PO TABS
1.0000 | ORAL_TABLET | Freq: Four times a day (QID) | ORAL | Status: DC | PRN
  Administered 2023-07-10 – 2023-07-11 (×2): 1 via ORAL
  Administered 2023-07-11 – 2023-07-12 (×5): 2 via ORAL
  Administered 2023-07-13 (×3): 1 via ORAL
  Administered 2023-07-14 – 2023-07-16 (×7): 2 via ORAL
  Administered 2023-07-16: 1 via ORAL
  Filled 2023-07-09 (×2): qty 2
  Filled 2023-07-09 (×2): qty 1
  Filled 2023-07-09 (×5): qty 2
  Filled 2023-07-09: qty 1
  Filled 2023-07-09 (×6): qty 2
  Filled 2023-07-09 (×2): qty 1

## 2023-07-09 MED ORDER — DIPHENHYDRAMINE HCL 50 MG/ML IJ SOLN
25.0000 mg | Freq: Once | INTRAMUSCULAR | Status: AC
Start: 2023-07-09 — End: 2023-07-09
  Administered 2023-07-09: 25 mg via INTRAVENOUS
  Filled 2023-07-09: qty 1

## 2023-07-09 MED ORDER — METOCLOPRAMIDE HCL 5 MG PO TABS: 5.0000 mg | ORAL_TABLET | Freq: Three times a day (TID) | ORAL | Status: DC | PRN

## 2023-07-09 MED ORDER — DEXAMETHASONE SODIUM PHOSPHATE 10 MG/ML IJ SOLN
INTRAMUSCULAR | Status: DC | PRN
  Administered 2023-07-09: 8 mg via INTRAVENOUS

## 2023-07-09 MED ORDER — PROPOFOL 10 MG/ML IV BOLUS
INTRAVENOUS | Status: AC
  Filled 2023-07-09: qty 20

## 2023-07-09 MED ORDER — ACETAMINOPHEN 10 MG/ML IV SOLN: 1000.0000 mg | Freq: Once | INTRAVENOUS | Status: DC | PRN

## 2023-07-09 MED ORDER — ACETAMINOPHEN 500 MG PO TABS
500.0000 mg | ORAL_TABLET | Freq: Four times a day (QID) | ORAL | Status: AC
  Administered 2023-07-09 – 2023-07-10 (×4): 500 mg via ORAL
  Filled 2023-07-09 (×4): qty 1

## 2023-07-09 MED ORDER — FENTANYL CITRATE (PF) 100 MCG/2ML IJ SOLN: 25.0000 ug | INTRAMUSCULAR | Status: DC | PRN

## 2023-07-09 MED ORDER — PROPOFOL 500 MG/50ML IV EMUL
INTRAVENOUS | Status: DC | PRN
  Administered 2023-07-09: 50 ug/kg/min via INTRAVENOUS

## 2023-07-09 MED ORDER — GLYCOPYRROLATE 0.2 MG/ML IJ SOLN
INTRAMUSCULAR | Status: AC
  Filled 2023-07-09: qty 1

## 2023-07-09 MED ORDER — HYDRALAZINE HCL 20 MG/ML IJ SOLN
5.0000 mg | INTRAMUSCULAR | Status: DC | PRN
  Administered 2023-07-09: 5 mg via INTRAVENOUS
  Filled 2023-07-09: qty 1

## 2023-07-09 MED ORDER — MORPHINE SULFATE (PF) 2 MG/ML IV SOLN
0.5000 mg | INTRAVENOUS | Status: DC | PRN
  Filled 2023-07-09: qty 1

## 2023-07-09 MED ORDER — BUPIVACAINE-EPINEPHRINE (PF) 0.25% -1:200000 IJ SOLN
INTRAMUSCULAR | Status: AC
  Filled 2023-07-09: qty 30

## 2023-07-09 MED ORDER — CEFAZOLIN SODIUM-DEXTROSE 2-4 GM/100ML-% IV SOLN
2.0000 g | Freq: Once | INTRAVENOUS | Status: AC
  Administered 2023-07-09: 2 g via INTRAVENOUS

## 2023-07-09 MED ORDER — MORPHINE SULFATE (PF) 2 MG/ML IV SOLN
2.0000 mg | INTRAVENOUS | Status: DC | PRN
  Administered 2023-07-09 (×2): 2 mg via INTRAVENOUS
  Filled 2023-07-09 (×3): qty 1

## 2023-07-09 MED ORDER — TRANEXAMIC ACID-NACL 1000-0.7 MG/100ML-% IV SOLN
INTRAVENOUS | Status: AC
  Filled 2023-07-09: qty 100

## 2023-07-09 MED ORDER — FENTANYL CITRATE (PF) 100 MCG/2ML IJ SOLN
INTRAMUSCULAR | Status: DC | PRN
  Administered 2023-07-09: 50 ug via INTRAVENOUS

## 2023-07-09 MED ORDER — PANTOPRAZOLE SODIUM 40 MG PO TBEC
40.0000 mg | DELAYED_RELEASE_TABLET | Freq: Every day | ORAL | Status: DC
  Administered 2023-07-10 – 2023-07-16 (×7): 40 mg via ORAL
  Filled 2023-07-09 (×8): qty 1

## 2023-07-09 MED ORDER — MIDAZOLAM HCL 2 MG/2ML IJ SOLN
INTRAMUSCULAR | Status: AC
  Filled 2023-07-09: qty 2

## 2023-07-09 MED ORDER — POLYETHYLENE GLYCOL 3350 17 G PO PACK
17.0000 g | PACK | Freq: Every day | ORAL | Status: DC
Start: 2023-07-10 — End: 2023-07-14
  Administered 2023-07-10 – 2023-07-14 (×5): 17 g via ORAL
  Filled 2023-07-09 (×5): qty 1

## 2023-07-09 MED ORDER — MORPHINE SULFATE (PF) 4 MG/ML IV SOLN
4.0000 mg | Freq: Once | INTRAVENOUS | Status: AC
  Administered 2023-07-09: 4 mg via INTRAVENOUS
  Filled 2023-07-09: qty 1

## 2023-07-09 MED ORDER — FENTANYL CITRATE (PF) 100 MCG/2ML IJ SOLN
INTRAMUSCULAR | Status: AC
  Filled 2023-07-09: qty 2

## 2023-07-09 MED ORDER — GLYCOPYRROLATE 0.2 MG/ML IJ SOLN
INTRAMUSCULAR | Status: DC | PRN
  Administered 2023-07-09: .2 mg via INTRAVENOUS

## 2023-07-09 MED ORDER — SODIUM CHLORIDE 0.9 % IV BOLUS
1000.0000 mL | Freq: Once | INTRAVENOUS | Status: AC
  Administered 2023-07-09: 1000 mL via INTRAVENOUS

## 2023-07-09 MED ORDER — ROCURONIUM BROMIDE 10 MG/ML (PF) SYRINGE
PREFILLED_SYRINGE | INTRAVENOUS | Status: AC
  Filled 2023-07-09: qty 10

## 2023-07-09 MED ORDER — ATORVASTATIN CALCIUM 20 MG PO TABS
40.0000 mg | ORAL_TABLET | Freq: Every day | ORAL | Status: DC
  Administered 2023-07-10 – 2023-07-16 (×7): 40 mg via ORAL
  Filled 2023-07-09 (×7): qty 2

## 2023-07-09 MED ORDER — ACETAMINOPHEN 10 MG/ML IV SOLN
INTRAVENOUS | Status: AC
  Filled 2023-07-09: qty 100

## 2023-07-09 MED ORDER — ONDANSETRON HCL 4 MG/2ML IJ SOLN
4.0000 mg | Freq: Three times a day (TID) | INTRAMUSCULAR | Status: DC | PRN
Start: 2023-07-09 — End: 2023-07-16
  Administered 2023-07-09 – 2023-07-15 (×6): 4 mg via INTRAVENOUS
  Filled 2023-07-09 (×6): qty 2

## 2023-07-09 MED ORDER — PHENYLEPHRINE 80 MCG/ML (10ML) SYRINGE FOR IV PUSH (FOR BLOOD PRESSURE SUPPORT)
PREFILLED_SYRINGE | INTRAVENOUS | Status: DC | PRN
  Administered 2023-07-09: 80 ug via INTRAVENOUS
  Administered 2023-07-09: 160 ug via INTRAVENOUS
  Administered 2023-07-09 (×2): 80 ug via INTRAVENOUS

## 2023-07-09 MED ORDER — ACETAMINOPHEN 10 MG/ML IV SOLN
INTRAVENOUS | Status: DC | PRN
  Administered 2023-07-09: 1000 mg via INTRAVENOUS

## 2023-07-09 MED ORDER — BUPIVACAINE HCL (PF) 0.5 % IJ SOLN
INTRAMUSCULAR | Status: AC
  Filled 2023-07-09: qty 10

## 2023-07-09 MED ORDER — 0.9 % SODIUM CHLORIDE (POUR BTL) OPTIME
TOPICAL | Status: DC | PRN
  Administered 2023-07-09: 500 mL

## 2023-07-09 MED ORDER — IRBESARTAN 150 MG PO TABS
300.0000 mg | ORAL_TABLET | Freq: Every day | ORAL | Status: DC
  Administered 2023-07-10 – 2023-07-16 (×7): 300 mg via ORAL
  Filled 2023-07-09 (×8): qty 2

## 2023-07-09 MED ORDER — DOCUSATE SODIUM 100 MG PO CAPS
100.0000 mg | ORAL_CAPSULE | Freq: Two times a day (BID) | ORAL | Status: DC
  Administered 2023-07-09 – 2023-07-13 (×9): 100 mg via ORAL
  Filled 2023-07-09 (×8): qty 1

## 2023-07-09 MED ORDER — CEFAZOLIN SODIUM-DEXTROSE 2-4 GM/100ML-% IV SOLN
2.0000 g | Freq: Four times a day (QID) | INTRAVENOUS | Status: AC
  Administered 2023-07-09 – 2023-07-10 (×2): 2 g via INTRAVENOUS
  Filled 2023-07-09: qty 100

## 2023-07-09 MED ORDER — METOCLOPRAMIDE HCL 5 MG/ML IJ SOLN: 5.0000 mg | Freq: Three times a day (TID) | INTRAMUSCULAR | Status: DC | PRN

## 2023-07-09 MED ORDER — ASPIRIN 81 MG PO TBEC
81.0000 mg | DELAYED_RELEASE_TABLET | Freq: Every day | ORAL | Status: DC
  Administered 2023-07-10 – 2023-07-16 (×7): 81 mg via ORAL
  Filled 2023-07-09 (×7): qty 1

## 2023-07-09 MED ORDER — HYDRALAZINE HCL 20 MG/ML IJ SOLN: 10.0000 mg | INTRAMUSCULAR | Status: DC | PRN

## 2023-07-09 MED ORDER — OXYCODONE HCL 5 MG/5ML PO SOLN: 5.0000 mg | Freq: Once | ORAL | Status: DC | PRN

## 2023-07-09 MED ORDER — PROPOFOL 1000 MG/100ML IV EMUL
INTRAVENOUS | Status: AC
  Filled 2023-07-09: qty 100

## 2023-07-09 MED ORDER — SENNA 8.6 MG PO TABS
2.0000 | ORAL_TABLET | Freq: Every day | ORAL | Status: DC
  Administered 2023-07-10 – 2023-07-16 (×7): 17.2 mg via ORAL
  Filled 2023-07-09 (×7): qty 2

## 2023-07-09 MED ORDER — TRANEXAMIC ACID-NACL 1000-0.7 MG/100ML-% IV SOLN
1000.0000 mg | Freq: Once | INTRAVENOUS | Status: AC
  Administered 2023-07-09: 1000 mg via INTRAVENOUS

## 2023-07-09 MED ORDER — EPINEPHRINE HCL 5 MG/250ML IV SOLN IN NS
INTRAVENOUS | Status: AC
  Filled 2023-07-09: qty 250

## 2023-07-09 MED ORDER — PHENOL 1.4 % MT LIQD: 1.0000 | OROMUCOSAL | Status: DC | PRN

## 2023-07-09 MED ORDER — DEXAMETHASONE SODIUM PHOSPHATE 10 MG/ML IJ SOLN
INTRAMUSCULAR | Status: AC
  Filled 2023-07-09: qty 1

## 2023-07-09 MED ORDER — BUPIVACAINE-EPINEPHRINE (PF) 0.25% -1:200000 IJ SOLN
INTRAMUSCULAR | Status: DC | PRN
  Administered 2023-07-09: 20 mL via PERINEURAL

## 2023-07-09 MED ORDER — ENOXAPARIN SODIUM 40 MG/0.4ML IJ SOSY
40.0000 mg | PREFILLED_SYRINGE | INTRAMUSCULAR | Status: DC
  Administered 2023-07-09 – 2023-07-16 (×7): 40 mg via SUBCUTANEOUS
  Filled 2023-07-09 (×6): qty 0.4

## 2023-07-09 MED ORDER — METHOCARBAMOL 500 MG PO TABS
500.0000 mg | ORAL_TABLET | Freq: Four times a day (QID) | ORAL | Status: DC | PRN
  Administered 2023-07-11 – 2023-07-15 (×4): 500 mg via ORAL
  Filled 2023-07-09 (×5): qty 1

## 2023-07-09 MED ORDER — SODIUM CHLORIDE 0.9 % IV SOLN: INTRAVENOUS | Status: DC | PRN

## 2023-07-09 MED ORDER — ONDANSETRON HCL 4 MG/2ML IJ SOLN
INTRAMUSCULAR | Status: AC
  Filled 2023-07-09: qty 2

## 2023-07-09 MED ORDER — TRANEXAMIC ACID-NACL 1000-0.7 MG/100ML-% IV SOLN
1000.0000 mg | INTRAVENOUS | Status: AC
  Administered 2023-07-09: 1000 mg via INTRAVENOUS

## 2023-07-09 MED ORDER — LIDOCAINE HCL (PF) 2 % IJ SOLN
INTRAMUSCULAR | Status: AC
Start: 2023-07-09 — End: ?
  Filled 2023-07-09: qty 5

## 2023-07-09 SURGICAL SUPPLY — 47 items
BIT DRILL CALIBRATED 4.2 (BIT) IMPLANT
BIT DRILL CANN 16 HIP (BIT) IMPLANT
BIT DRILL CANN STP 6/9 HIP (BIT) IMPLANT
BIT DRILL TAPERED 10 (BIT) IMPLANT
BLADE TFNA HELICA 100 (Anchor) IMPLANT
BNDG COHESIVE 6X5 TAN ST LF (GAUZE/BANDAGES/DRESSINGS) ×2 IMPLANT
CHLORAPREP W/TINT 26 (MISCELLANEOUS) ×1 IMPLANT
DERMABOND ADVANCED .7 DNX12 (GAUZE/BANDAGES/DRESSINGS) ×1 IMPLANT
DRAPE C-ARM XRAY 36X54 (DRAPES) ×1 IMPLANT
DRAPE C-ARMOR (DRAPES) IMPLANT
DRAPE SHEET LG 3/4 BI-LAMINATE (DRAPES) ×1 IMPLANT
DRILL BIT CALIBRATED 4.2 (BIT) ×1 IMPLANT
DRSG OPSITE POSTOP 3X4 (GAUZE/BANDAGES/DRESSINGS) IMPLANT
DRSG OPSITE POSTOP 4X6 (GAUZE/BANDAGES/DRESSINGS) IMPLANT
ELECT CAUTERY BLADE 6.4 (BLADE) IMPLANT
ELECT REM PT RETURN 9FT ADLT (ELECTROSURGICAL) IMPLANT
ELECTRODE REM PT RTRN 9FT ADLT (ELECTROSURGICAL) IMPLANT
GLOVE PI ORTHO PRO STRL 7.5 (GLOVE) ×2 IMPLANT
GLOVE SURG SYN 7.5 E (GLOVE) ×1 IMPLANT
GLOVE SURG SYN 7.5 PF PI (GLOVE) ×1 IMPLANT
GOWN SRG XL LVL 3 NONREINFORCE (GOWNS) ×1 IMPLANT
GOWN STRL REUS W/ TWL LRG LVL3 (GOWN DISPOSABLE) ×1 IMPLANT
GUIDEWIRE 3.2X400 (WIRE) IMPLANT
HANDLE YANKAUER SUCT OPEN TIP (MISCELLANEOUS) ×1 IMPLANT
KIT PATIENT CARE HANA TABLE (KITS) ×1 IMPLANT
KIT TURNOVER CYSTO (KITS) ×1 IMPLANT
MANIFOLD NEPTUNE II (INSTRUMENTS) ×1 IMPLANT
MAT ABSORB FLUID 56X50 GRAY (MISCELLANEOUS) ×1 IMPLANT
NAIL CANN TFNA 9X170 125D (Nail) IMPLANT
NDL HYPO 21X1.5 SAFETY (NEEDLE) ×1 IMPLANT
NEEDLE HYPO 21X1.5 SAFETY (NEEDLE) ×1 IMPLANT
NS IRRIG 500ML POUR BTL (IV SOLUTION) ×1 IMPLANT
PACK HIP COMPR (MISCELLANEOUS) ×1 IMPLANT
PAD ARMBOARD POSITIONER FOAM (MISCELLANEOUS) ×1 IMPLANT
PENCIL SMOKE EVACUATOR (MISCELLANEOUS) ×1 IMPLANT
SCREW LOCK T25 FT 36X5X4.3X (Screw) IMPLANT
SLEEVE SCD COMPRESS KNEE MED (STOCKING) ×1 IMPLANT
SUT STRATA 1 CT-1 DLB (SUTURE) ×1 IMPLANT
SUT VIC AB 1 CT1 36 (SUTURE) ×1 IMPLANT
SUT VIC AB 2-0 CT2 27 (SUTURE) ×1 IMPLANT
SUTURE STRATA 1 CT-1 DLB (SUTURE) ×1 IMPLANT
SUTURE STRATA SPIR 4-0 18 (SUTURE) ×1 IMPLANT
SYR 20ML LL LF (SYRINGE) ×1 IMPLANT
TAPE MICROFOAM 4IN (TAPE) IMPLANT
TRAP FLUID SMOKE EVACUATOR (MISCELLANEOUS) IMPLANT
TRAY FOLEY SLVR 16FR LF STAT (SET/KITS/TRAYS/PACK) IMPLANT
WATER STERILE IRR 1000ML POUR (IV SOLUTION) ×1 IMPLANT

## 2023-07-09 NOTE — Anesthesia Procedure Notes (Signed)
 Procedure Name: MAC Date/Time: 07/09/2023 3:34 PM  Performed by: Carolynn Citrin, CRNAPre-anesthesia Checklist: Patient identified, Emergency Drugs available, Suction available, Patient being monitored and Timeout performed Patient Re-evaluated:Patient Re-evaluated prior to induction Oxygen Delivery Method: Simple face mask Preoxygenation: Pre-oxygenation with 100% oxygen Induction Type: IV induction Placement Confirmation: positive ETCO2

## 2023-07-09 NOTE — H&P (Signed)
 History and Physical    Patient: Shelly Armstrong WUJ:[id number] DOB: 1945/07/10 DOA: 07/08/2023 DOS: the patient was seen and examined on 07/09/2023 PCP: Shelly Regulus, Armstrong  Patient coming from: Home  Chief Complaint:  Chief Complaint  Patient presents with   Hip Pain    HPI: Shelly Armstrong is a 78 y.o. female with medical history significant for HTN, HLD who presented with low back pain, chronic constipation coming in with right intratrochanteric fracture resulting from an accidental fall when she tripped over his socks while walking on a carpeted floor falling onto her right side.  The ED provider spoke with orthopedic Dr. Audelia Acton who will try to get her into surgery by 2 PM Review of ED data: BP elevated 2/200 with otherwise normal vitals Labs notable for hyponatremia of 124 (baseline 121-131). and mild anemia of 11.7.  Troponin normal EKG, Personally viewed and interpreted showing sinus rhythm at 73 with probable LVH Chest x-ray nonacute  Patient achieved pain control with fentanyl given by EMS and subsequently morphine in the ED She was also treated with an NS bolus for hyponatremia Hospitalist consulted for admission.   Review of Systems: As mentioned in the history of present illness. All other systems reviewed and are negative.  Past Medical History:  Diagnosis Date   Chickenpox    Edema    feet/legs   Hyperglycemia    Hyperlipidemia    Hypertension    Osteopenia    PONV (postoperative nausea and vomiting)    after hand surgery   Past Surgical History:  Procedure Laterality Date   ABDOMINAL HYSTERECTOMY     BREAST EXCISIONAL BIOPSY Left 2001   stereotactic biopsy - negative   BREAST SURGERY     Breast excisional biopsy   CATARACT EXTRACTION W/PHACO Right 10/19/2014   Procedure: CATARACT EXTRACTION PHACO AND INTRAOCULAR LENS PLACEMENT (IOC);  Surgeon: Galen Manila, Armstrong;  Location: [facility] ORS;  Service: Ophthalmology;  Laterality: Right;  casette lot#  [id number]   [place]  00:29 AP17.7 CDE 5.09   CATARACT EXTRACTION W/PHACO Left 11/02/2014   Procedure: CATARACT EXTRACTION PHACO AND INTRAOCULAR LENS PLACEMENT (IOC);  Surgeon: Galen Manila, Armstrong;  Location: [facility] ORS;  Service: Ophthalmology;  Laterality: Left;  [place]: 00:34.2 AP%: 22.5 CDE: 7.71  Fluid lot# [id number]   CHOLECYSTECTOMY     COLONOSCOPY WITH PROPOFOL N/A 08/31/2014   Procedure: COLONOSCOPY WITH PROPOFOL;  Surgeon: Christena Deem, Armstrong;  Location: [facility] ENDOSCOPY;  Service: Endoscopy;  Laterality: N/A;   ctr     ESOPHAGOGASTRODUODENOSCOPY N/A 08/31/2014   Procedure: ESOPHAGOGASTRODUODENOSCOPY (EGD);  Surgeon: Christena Deem, Armstrong;  Location: [facility] ENDOSCOPY;  Service: Endoscopy;  Laterality: N/A;   ESOPHAGOGASTRODUODENOSCOPY (EGD) WITH PROPOFOL N/A 08/16/2020   Procedure: ESOPHAGOGASTRODUODENOSCOPY (EGD) WITH PROPOFOL;  Surgeon: Regis Bill, Armstrong;  Location: [facility] ENDOSCOPY;  Service: Endoscopy;  Laterality: N/A;   FRACTURE SURGERY     Jaw fx repair; Hand procedure after trauma   IR KYPHO THORACIC WITH BONE BIOPSY  04/09/2023   IR RADIOLOGIST EVAL & MGMT  04/02/2023   IR RADIOLOGIST EVAL & MGMT  04/25/2023   MANDIBLE FRACTURE SURGERY     WRIST SURGERY     CTR   Social History:  reports that she has never smoked. She has never used smokeless tobacco. She reports that she does not drink alcohol and does not use drugs.  Allergies  Allergen Reactions   Sulfamethoxazole-Trimethoprim Rash and Other (See Comments)    Family History  Problem Relation Age of  Onset   Heart failure Mother    CAD Father    Breast cancer Neg Hx     Prior to Admission medications   Medication Sig Start Date End Date Taking? Authorizing Provider  acetaminophen (TYLENOL) 500 MG tablet Take 1,000 mg by mouth every 6 (six) hours as needed for moderate pain.    Provider, Historical, Armstrong  aspirin 81 MG tablet Take 81 mg by mouth daily.    Provider, Historical, Armstrong  atorvastatin (LIPITOR) 40 MG tablet Take 40 mg by  mouth daily.    Provider, Historical, Armstrong  Calcium-Vitamin D (CALTRATE 600 PLUS-VIT D PO) Take 1 tablet by mouth 2 (two) times daily.    Provider, Historical, Armstrong  carvedilol (COREG) 6.25 MG tablet Take 6.25 mg by mouth 2 (two) times daily with a meal. 02/27/23 02/27/24  Provider, Historical, Armstrong  dicyclomine (BENTYL) 10 MG capsule Take 10 mg by mouth 4 (four) times daily -  before meals and at bedtime.    Provider, Historical, Armstrong  Multiple Vitamins-Minerals (CENTRUM SILVER PO) Take 1 tablet by mouth daily.    Provider, Historical, Armstrong  olmesartan-hydrochlorothiazide (BENICAR HCT) 40-25 MG tablet  08/04/18   Provider, Historical, Armstrong  pantoprazole (PROTONIX) 40 MG tablet Take 40 mg by mouth daily.    Provider, Historical, Armstrong  Polyethylene Glycol 3350-GRX POWD Take 17 g by mouth daily as needed.    Provider, Historical, Armstrong  senna (SENOKOT) 8.6 MG TABS tablet Take 1 tablet by mouth.    Provider, Historical, Armstrong    Physical Exam: Vitals:   07/08/23 2149 07/08/23 2150 07/08/23 2200 07/08/23 2345  BP:  (!) 224/86 (!) 202/78 (!) 180/79  Pulse:  66 69 69  Resp:  18 18 11   Temp:  98 F (36.7 C)    TempSrc:  Oral    SpO2:  100% 96% 100%  Weight: 59.4 kg     Height: 5\' 1"  (1.549 m)      Physical Exam Vitals and nursing note reviewed.  Constitutional:      General: She is not in acute distress. HENT:     Head: Normocephalic and atraumatic.  Cardiovascular:     Rate and Rhythm: Normal rate and regular rhythm.     Heart sounds: Normal heart sounds.  Pulmonary:     Effort: Pulmonary effort is normal.     Breath sounds: Normal breath sounds.  Abdominal:     Palpations: Abdomen is soft.     Tenderness: There is no abdominal tenderness.  Neurological:     Mental Status: Mental status is at baseline.     Labs on Admission: I have personally reviewed following labs and imaging studies  CBC: Recent Labs  Lab 07/08/23 2348  WBC 6.2  HGB 11.7*  HCT 32.7*  MCV 91.9  PLT 196   Basic Metabolic  Panel: Recent Labs  Lab 07/08/23 2348  NA 124*  K 3.6  CL 92*  CO2 23  GLUCOSE 123*  BUN 14  CREATININE 0.43*  CALCIUM 9.0   GFR: Estimated Creatinine Clearance: 47.9 mL/min (A) (by C-G formula based on SCr of 0.43 mg/dL (L)). Liver Function Tests: No results for input(s): "AST", "ALT", "ALKPHOS", "BILITOT", "PROT", "ALBUMIN" in the last 168 hours. No results for input(s): "LIPASE", "AMYLASE" in the last 168 hours. No results for input(s): "AMMONIA" in the last 168 hours. Coagulation Profile: No results for input(s): "INR", "PROTIME" in the last 168 hours. Cardiac Enzymes: No results for input(s): "CKTOTAL", "CKMB", "CKMBINDEX", "TROPONINI" in  the last 168 hours. BNP (last 3 results) No results for input(s): "PROBNP" in the last 8760 hours. HbA1C: No results for input(s): "HGBA1C" in the last 72 hours. CBG: No results for input(s): "GLUCAP" in the last 168 hours. Lipid Profile: No results for input(s): "CHOL", "HDL", "LDLCALC", "TRIG", "CHOLHDL", "LDLDIRECT" in the last 72 hours. Thyroid Function Tests: No results for input(s): "TSH", "T4TOTAL", "FREET4", "T3FREE", "THYROIDAB" in the last 72 hours. Anemia Panel: No results for input(s): "VITAMINB12", "FOLATE", "FERRITIN", "TIBC", "IRON", "RETICCTPCT" in the last 72 hours. Urine analysis:    Component Value Date/Time   COLORURINE YELLOW (A) 03/08/2023 1336   APPEARANCEUR CLEAR (A) 03/08/2023 1336   LABSPEC 1.011 03/08/2023 1336   PHURINE 6.0 03/08/2023 1336   GLUCOSEU NEGATIVE 03/08/2023 1336   HGBUR NEGATIVE 03/08/2023 1336   BILIRUBINUR NEGATIVE 03/08/2023 1336   KETONESUR 5 (A) 03/08/2023 1336   PROTEINUR NEGATIVE 03/08/2023 1336   NITRITE NEGATIVE 03/08/2023 1336   LEUKOCYTESUR TRACE (A) 03/08/2023 1336    Radiological Exams on Admission: DG Chest Port 1 View Result Date: 07/09/2023 CLINICAL DATA:  Known right hip fracture preoperative evaluation EXAM: PORTABLE CHEST 1 VIEW COMPARISON:  12/03/2016 FINDINGS: The  heart size and mediastinal contours are within normal limits. Both lungs are clear. The visualized skeletal structures are unremarkable. IMPRESSION: No active disease. Electronically Signed   By: Alcide Clever M.D.   On: 07/09/2023 00:01   DG Femur Min 2 Views Right Result Date: 07/08/2023 CLINICAL DATA:  Fall, right hip pain EXAM: RIGHT FEMUR 2 VIEWS COMPARISON:  None Available. FINDINGS: There is a right femoral intertrochanteric fracture with mild displacement. No significant angulation. No additional acute bony abnormality. Mild degenerative changes in the right hip and knee joints. IMPRESSION: Right femoral intertrochanteric fracture. Electronically Signed   By: Charlett Nose M.D.   On: 07/08/2023 23:30   DG Hip Unilat  With Pelvis 2-3 Views Right Result Date: 07/08/2023 CLINICAL DATA:  Right hip pain EXAM: DG HIP (WITH OR WITHOUT PELVIS) 2-3V RIGHT COMPARISON:  None Available. FINDINGS: Right femoral intertrochanteric fracture noted. No significant angulation. Mild displacement. No subluxation or dislocation. IMPRESSION: Right femoral intertrochanteric fracture. Electronically Signed   By: Charlett Nose M.D.   On: 07/08/2023 23:30     Data Reviewed: Relevant notes from primary care and specialist visits, past discharge summaries as available in EHR, including Care Everywhere. Prior diagnostic testing as pertinent to current admission diagnoses Updated medications and problem lists for reconciliation ED course, including vitals, labs, imaging, treatment and response to treatment Triage notes, nursing and pharmacy notes and ED provider's notes Notable results as noted in HPI   Assessment and Plan: * Closed intertrochanteric fracture of hip, right, initial encounter (HCC) Accidental fall Preoperative clearance N.p.o. for surgery later today Pain control Further orders per orthopedic service No contraindications to proposed orthopedic repair.  Low risk of perioperative cardiopulmonary  events Will try to control blood pressure prior to procedure  Hypertensive urgency Systolic over 200 in part related to pain Pain control Continue home carvedilol Hydralazine as needed IV  Chronic hyponatremia Received a 1 L NS bolus in the ED Suspect SIADH        DVT prophylaxis: SCD  Consults: ortho  Advance Care Planning:   Code Status: Prior   Family Communication: none  Disposition Plan: Back to previous home environment  Severity of Illness: The appropriate patient status for this patient is INPATIENT. Inpatient status is judged to be reasonable and necessary in order to provide the  required intensity of service to ensure the patient's safety. The patient's presenting symptoms, physical exam findings, and initial radiographic and laboratory data in the context of their chronic comorbidities is felt to place them at high risk for further clinical deterioration. Furthermore, it is not anticipated that the patient will be medically stable for discharge from the hospital within 2 midnights of admission.   * I certify that at the point of admission it is my clinical judgment that the patient will require inpatient hospital care spanning beyond 2 midnights from the point of admission due to high intensity of service, high risk for further deterioration and high frequency of surveillance required.*  Author: Lanetta Pion, Armstrong 07/09/2023 1:15 AM  For on call review [email or web address].

## 2023-07-09 NOTE — Assessment & Plan Note (Signed)
 Accidental fall Preoperative clearance N.p.o. for surgery later today Pain control Further orders per orthopedic service No contraindications to proposed orthopedic repair.  Low risk of perioperative cardiopulmonary events Will try to control blood pressure prior to procedure

## 2023-07-09 NOTE — Transfer of Care (Signed)
 Immediate Anesthesia Transfer of Care Note  Patient: Shelly Armstrong  Procedure(s) Performed: FIXATION, FRACTURE, INTERTROCHANTERIC, WITH INTRAMEDULLARY ROD (Right: Hip)  Patient Location: PACU  Anesthesia Type:Spinal  Level of Consciousness: awake, alert , oriented, and patient cooperative  Airway & Oxygen Therapy: Patient Spontanous Breathing and Patient connected to face mask oxygen  Post-op Assessment: Report given to RN and Post -op Vital signs reviewed and stable  Post vital signs: Reviewed and stable  Last Vitals:  Vitals Value Taken Time  BP 118/103 07/09/23 1700  Temp 97   Pulse 73 07/09/23 1704  Resp 18 07/09/23 1704  SpO2 100 % 07/09/23 1704  Vitals shown include unfiled device data.  Last Pain:  Vitals:   07/09/23 1254  TempSrc:   PainSc: 10-Worst pain ever         Complications: No notable events documented.

## 2023-07-09 NOTE — ED Notes (Signed)
 Tried to dress pt in a gown and pt refused and said she was comfortable in her clothes.

## 2023-07-09 NOTE — Assessment & Plan Note (Signed)
 Seems chronic and slight worsening with normal saline.  Patient was also on HCTZ at home which is likely contributory.  Hyponatremia labs with slightly low serum osmolality, urine studies still pending.  TSH normal, a.m. cortisol low at 4.0 Suspect SIADH -Adding salt tablet -Will repeat a.m. cortisol

## 2023-07-09 NOTE — ED Notes (Signed)
 MD messaged about pain medication not managing pain.

## 2023-07-09 NOTE — Hospital Course (Signed)
 Marland Kitchen

## 2023-07-09 NOTE — Anesthesia Preprocedure Evaluation (Signed)
 Anesthesia Evaluation  Patient identified by MRN, date of birth, ID band Patient awake    Reviewed: Allergy & Precautions, H&P , NPO status , Patient's Chart, lab work & pertinent test results, reviewed documented beta blocker date and time   History of Anesthesia Complications (+) PONV and history of anesthetic complications  Airway Mallampati: II   Neck ROM: full    Dental  (+) Poor Dentition   Pulmonary neg pulmonary ROS   Pulmonary exam normal        Cardiovascular Exercise Tolerance: Poor hypertension, On Medications + angina with exertion Normal cardiovascular exam Rhythm:regular Rate:Normal     Neuro/Psych negative neurological ROS  negative psych ROS   GI/Hepatic Neg liver ROS,GERD  Medicated,,  Endo/Other  negative endocrine ROS    Renal/GU negative Renal ROS  negative genitourinary   Musculoskeletal   Abdominal   Peds  Hematology negative hematology ROS (+)   Anesthesia Other Findings Past Medical History: No date: Chickenpox No date: Edema     Comment:  feet/legs No date: Hyperglycemia No date: Hyperlipidemia No date: Hypertension No date: Osteopenia No date: PONV (postoperative nausea and vomiting)     Comment:  after hand surgery Past Surgical History: No date: ABDOMINAL HYSTERECTOMY 2001: BREAST EXCISIONAL BIOPSY; Left     Comment:  stereotactic biopsy - negative No date: BREAST SURGERY     Comment:  Breast excisional biopsy 10/19/2014: CATARACT EXTRACTION W/PHACO; Right     Comment:  Procedure: CATARACT EXTRACTION PHACO AND INTRAOCULAR               LENS PLACEMENT (IOC);  Surgeon: Galen Manila, MD;                Location: [facility] ORS;  Service: Ophthalmology;  Laterality:              Right;  casette lot# [id number]   [place]  00:29 AP17.7 CDE 5.09 11/02/2014: CATARACT EXTRACTION W/PHACO; Left     Comment:  Procedure: CATARACT EXTRACTION PHACO AND INTRAOCULAR               LENS PLACEMENT  (IOC);  Surgeon: Galen Manila, MD;                Location: [facility] ORS;  Service: Ophthalmology;  Laterality:              Left;  [place]: 00:34.2 AP%: 22.5 CDE: 7.71  Fluid lot#               [id number] No date: CHOLECYSTECTOMY 08/31/2014: COLONOSCOPY WITH PROPOFOL; N/A     Comment:  Procedure: COLONOSCOPY WITH PROPOFOL;  Surgeon: Christena Deem, MD;  Location: [facility] ENDOSCOPY;  Service:               Endoscopy;  Laterality: N/A; No date: ctr 08/31/2014: ESOPHAGOGASTRODUODENOSCOPY; N/A     Comment:  Procedure: ESOPHAGOGASTRODUODENOSCOPY (EGD);  Surgeon:               Christena Deem, MD;  Location: [facility] ENDOSCOPY;                Service: Endoscopy;  Laterality: N/A; 08/16/2020: ESOPHAGOGASTRODUODENOSCOPY (EGD) WITH PROPOFOL; N/A     Comment:  Procedure: ESOPHAGOGASTRODUODENOSCOPY (EGD) WITH               PROPOFOL;  Surgeon: Regis Bill, MD;  Location:  [facility] ENDOSCOPY;  Service: Endoscopy;  Laterality: N/A; No date: FRACTURE SURGERY     Comment:  Jaw fx repair; Hand procedure after trauma 04/09/2023: IR KYPHO THORACIC WITH BONE BIOPSY 04/02/2023: IR RADIOLOGIST EVAL & MGMT 04/25/2023: IR RADIOLOGIST EVAL & MGMT No date: MANDIBLE FRACTURE SURGERY No date: WRIST SURGERY     Comment:  CTR BMI    Body Mass Index: 24.75 kg/m     Reproductive/Obstetrics negative OB ROS                             Anesthesia Physical Anesthesia Plan  ASA: 3 and emergent  Anesthesia Plan: Spinal   Post-op Pain Management:    Induction:   PONV Risk Score and Plan: 4 or greater  Airway Management Planned:   Additional Equipment:   Intra-op Plan:   Post-operative Plan:   Informed Consent: I have reviewed the patients History and Physical, chart, labs and discussed the procedure including the risks, benefits and alternatives for the proposed anesthesia with the patient or authorized representative who has indicated his/her understanding and  acceptance.     Dental Advisory Given  Plan Discussed with: CRNA  Anesthesia Plan Comments: (Will plan on SAB with sedation secondary to Hyponatremia issue.  Situation d/w patient and risks benefits reviewed.  She accepts this plan. ja)       Anesthesia Quick Evaluation

## 2023-07-09 NOTE — TOC CM/SW Note (Signed)
 CSW acknowledges consult for home health/DME needs. Plan for surgery today. Please consult PT and OT after procedure.  Duke Gibbons, CSW [phone]

## 2023-07-09 NOTE — Op Note (Signed)
 Patient Name: Shelly Armstrong  ZOX:[id number]  Pre-Operative Diagnosis: Right hip Intertrochanteric fracture  Post-Operative Diagnosis: (same)  Procedure: Right Hip Intramedullary nail   Components/Implants: Nail:TFNA 170x125 deg x15mm  Lag Blade : Locking Screw:36mm  Date of Surgery: 07/09/2023  Surgeon: Reinaldo Berber MD  Assistant: None  Anesthesiologist: Lorette Ang  Anesthesia: Spinal   EBL: 200cc  Complications: None   Brief history: The patient is a 78 year old female who presented to the [facility] emergency room after a fall and found to have a  right hip intertrochanteric fracture.  The patient was admitted by the medical team and optimized for surgery.  A thorough discussion was had with the patient and family about the risks and benefits of surgical intervention for their hip fracture as definitive treatment.  The patient and family opted to proceed with the operation.  All preoperative films were reviewed and an appropriate surgical plan was made prior to surgery.   Description of procedure: The patient was brought to the operating room where laterality was confirmed by all those present to be the right side.  The patient was administered anesthesia on a stretcher prior to being moved supine on the operating room table. Patient was given an intravenous dose of antibiotics for surgical prophylaxis and TXA.  All bony prominences and extremities were well padded and the patient was securely attached to the table boots, a perineal post was placed and the patient had a safety strap placed.  Surgical site was prepped with alcohol and chlorhexidine. The surgical site over the hip was and draped in typical sterile fashion with multiple layers of adhesive and nonadhesive drapes.  The incision site was marked out with a sterile marker under fluoroscopic guidance.    A surgical timeout was then called with participation of all staff in the room the patient was  then a confirmed again and laterality confirmed. The hip fracture was reduced through indirect measures with traction and rotation of the leg on the fracture table. After an acceptable reduction was obtained on AP and lateral images the procedure started.  An incision was made just proximal to the greater trochanter through the skin subcutaneous tissues and an incision was made in the glut max fascia.  A guidewire was placed through the greater trochanter at the tip under x-ray guidance into the intertrochanteric region.  The position of this wire was assessed on AP and lateral fluoroscopic images to ensure position.  An opening reamer was used to create access at the tip of the greater trochanter under fluoroscopic guidance.   A size 9 nail was advanced through the reamed hole in the proximal femur and seated within the femur to an appropriate depth under fluoroscopic guidance.  The aiming jig was used to mark an incision site for a lag blade to the femoral head which was then incised with a scalpel.  A wire was then advanced through the lateral cortex of the femur into the center of the femoral head on both AP and lateral fluoroscopic imaging stopping at the subchondral bone without penetration of the femoral head.  This wire was then used to measure for a lag screw and a size 100 lag blade was inserted into the femoral head through the lateral cortex of the femur and the nail under fluoroscopic guidance.  The setscrew was then tightened in the proximal part of the nail and engaged with the lag blade with a small amount of play left so as to allow compression of the fracture.  The lag screw driver was removed and the aiming jig was switched for the distal interlocking screw.  A drill was used to drill a hole for the distal interlocking screw under fluoroscopic guidance and a size 36mm screw was placed.  The intramedullary nail was assessed on AP and lateral fluoroscopic guidance prior to removal of the aiming  jig.  Final fluoroscopic x-rays were then taken after removal of the jig.  The nail was found to be in appropriate position on AP and lateral imaging with appropriate lengths of both the lag screw in the distal locking screw.  The fascia was closed with 0 Vicryl interrupted figure-of-eight sutures.  The subcutaneous tissues were closed with 2-0 Vicryl and the skin closed with 3-0 Monocryl and Dermabond.  Sterile dressings were applied to the incisions.   The patient was awoken from anesthesia transferred off of the operating room table onto a hospital bed.  The patient had a good pulse postoperatively in the foot . the patient was then transferred to the PACU in stable condition.

## 2023-07-09 NOTE — ED Notes (Signed)
 MD made aware of facial tingling. Sensation is equal on both sides. Pt reports tingling on both sides of face. Pt also reports abdominal pain. MD made aware.

## 2023-07-09 NOTE — Anesthesia Procedure Notes (Signed)
 Spinal  Patient location during procedure: OR Start time: 07/09/2023 3:25 PM End time: 07/09/2023 3:35 PM Reason for block: surgical anesthesia Staffing Performed: anesthesiologist  Anesthesiologist: Enrique Harvest, MD Performed by: Enrique Harvest, MD Authorized by: Zula Hitch, MD   Preanesthetic Checklist Completed: patient identified, IV checked, site marked, risks and benefits discussed, surgical consent, monitors and equipment checked, pre-op evaluation and timeout performed Spinal Block Patient position: left lateral decubitus Prep: Betadine Patient monitoring: heart rate, continuous pulse ox, blood pressure and cardiac monitor Approach: midline Location: L4-5 Injection technique: single-shot Needle Needle type: Whitacre and Introducer  Needle gauge: 22 G Needle length: 9 cm Assessment Sensory level: T4 Events: CSF return

## 2023-07-09 NOTE — Assessment & Plan Note (Signed)
 Systolic over 200 in part related to pain Pain control Continue home carvedilol Hydralazine as needed IV

## 2023-07-09 NOTE — Progress Notes (Signed)
 Progress Note Same day non-billable progress note  Patient: Shelly Armstrong ZOX:[id number] DOB: December 04, 1945 DOA: 07/08/2023     0 DOS: the patient was seen and examined on 07/09/2023   Brief hospital course:  Shelly Armstrong is a 78 y.o. female with medical history significant for HTN, HLD admitted with displaced right intertrochanteric femur fracture following mechanical fall.  Plan for OR later today with Dr. Clyda Dark.   Assessment and Plan: * Closed intertrochanteric fracture of hip, right, initial encounter (HCC) Accidental fall Preoperative clearance N.p.o. for surgery later today Orthopedic Surgery assistance appreciated  Pain control. Zofran with morphine given nausea. Some subjective face tingling but no visible rash or evidence of airway compromise- monitor closely for allergic reaction  PT evaluation postoperatively  Activity/incisional care per orthopedic service Incentive spirometry Fall precautions  Monitor hgb postoperatively    Hypertension Pain control Continue home carvedilol and ARB. Hold hydrochlorothiazide in light of hypoNa  IV hydralazine PRN   Chronic hyponatremia Na 124 on arrival, Received a 1 L NS bolus in the ED. Repeat chemistry ordered this morning still not done. Further management pending repeat sodium   Pt on hydrochlorothiazide at home but etiology not confirmed.   Hold hydrochlorothiazide  Check TSH  Follow up PCP   Constipation- continue bowel regimen. Follow up GI as scheduled   No IVF  Full code  Lovenox to resume tomorrow  NPO for now, resume heart healthy diet following surgery  Monitor/replace electrolytes      Subjective: Patient seen with daughter at bedside. Reporting nausea with morphine and some face tinging, but no visible rash or airway compromise. Awaiting surgery later today.   Physical Exam: Vitals:   07/09/23 1030 07/09/23 1200 07/09/23 1207 07/09/23 1209  BP: (!) 154/82 (!) 175/80  (!) 173/81  Pulse: 79 76     Resp: 14 15    Temp:   98.7 F (37.1 C)   TempSrc:      SpO2: 100% 100%    Weight:      Height:       Physical Exam Vitals and nursing note reviewed.  Constitutional:      General: She is not in acute distress. HENT:     Head: Normocephalic.     Mouth/Throat:     Pharynx: Oropharynx is clear.     Comments: No airway edema Eyes:     Extraocular Movements: Extraocular movements intact.  Cardiovascular:     Rate and Rhythm: Normal rate and regular rhythm.  Pulmonary:     Effort: Pulmonary effort is normal. No respiratory distress.     Breath sounds: Normal breath sounds.  Abdominal:     General: Bowel sounds are normal. There is no distension.     Palpations: Abdomen is soft.     Tenderness: There is no abdominal tenderness.  Musculoskeletal:     Cervical back: Neck supple.     Comments: Right leg NVI   Neurological:     Mental Status: She is alert. Mental status is at baseline.     Data Reviewed:   Labs on Admission: I have personally reviewed following labs and imaging studies  CBC: Recent Labs  Lab 07/08/23 2348  WBC 6.2  HGB 11.7*  HCT 32.7*  MCV 91.9  PLT 196   Basic Metabolic Panel: Recent Labs  Lab 07/08/23 2348  NA 124*  K 3.6  CL 92*  CO2 23  GLUCOSE 123*  BUN 14  CREATININE 0.43*  CALCIUM 9.0   GFR:  Estimated Creatinine Clearance: 47.9 mL/min (A) (by C-G formula based on SCr of 0.43 mg/dL (L)). Liver Function Tests: No results for input(s): "AST", "ALT", "ALKPHOS", "BILITOT", "PROT", "ALBUMIN" in the last 168 hours. No results for input(s): "LIPASE", "AMYLASE" in the last 168 hours. No results for input(s): "AMMONIA" in the last 168 hours. Coagulation Profile: No results for input(s): "INR", "PROTIME" in the last 168 hours. Cardiac Enzymes: No results for input(s): "CKTOTAL", "CKMB", "CKMBINDEX", "TROPONINI" in the last 168 hours. BNP (last 3 results) No results for input(s): "PROBNP" in the last 8760 hours. HbA1C: No results for  input(s): "HGBA1C" in the last 72 hours. CBG: No results for input(s): "GLUCAP" in the last 168 hours. Lipid Profile: No results for input(s): "CHOL", "HDL", "LDLCALC", "TRIG", "CHOLHDL", "LDLDIRECT" in the last 72 hours. Thyroid Function Tests: No results for input(s): "TSH", "T4TOTAL", "FREET4", "T3FREE", "THYROIDAB" in the last 72 hours. Anemia Panel: No results for input(s): "VITAMINB12", "FOLATE", "FERRITIN", "TIBC", "IRON", "RETICCTPCT" in the last 72 hours. Urine analysis:    Component Value Date/Time   COLORURINE YELLOW (A) 03/08/2023 1336   APPEARANCEUR CLEAR (A) 03/08/2023 1336   LABSPEC 1.011 03/08/2023 1336   PHURINE 6.0 03/08/2023 1336   GLUCOSEU NEGATIVE 03/08/2023 1336   HGBUR NEGATIVE 03/08/2023 1336   BILIRUBINUR NEGATIVE 03/08/2023 1336   KETONESUR 5 (A) 03/08/2023 1336   PROTEINUR NEGATIVE 03/08/2023 1336   NITRITE NEGATIVE 03/08/2023 1336   LEUKOCYTESUR TRACE (A) 03/08/2023 1336    Radiological Exams on Admission: DG HIP UNILAT WITH PELVIS 2-3 VIEWS RIGHT Result Date: 07/09/2023 CLINICAL DATA:  Elective surgery. EXAM: DG HIP (WITH OR WITHOUT PELVIS) 2-3V RIGHT COMPARISON:  Preoperative imaging FINDINGS: Four fluoroscopic spot views of the right hip submitted from the operating room. Femoral intramedullary nail with trans trochanteric and distal locking screw fixation traverse proximal femur fracture. Fluoroscopy time 3 minutes. Dose 32.52 mGy. IMPRESSION: Intraoperative fluoroscopy during right femur fracture fixation. Electronically Signed   By: Narda Rutherford M.D.   On: 07/09/2023 16:54   DG C-Arm 1-60 Min-No Report Result Date: 07/09/2023 Fluoroscopy was utilized by the requesting physician.  No radiographic interpretation.   DG C-Arm 1-60 Min-No Report Result Date: 07/09/2023 Fluoroscopy was utilized by the requesting physician.  No radiographic interpretation.   DG Chest Port 1 View Result Date: 07/09/2023 CLINICAL DATA:  Known right hip fracture  preoperative evaluation EXAM: PORTABLE CHEST 1 VIEW COMPARISON:  12/03/2016 FINDINGS: The heart size and mediastinal contours are within normal limits. Both lungs are clear. The visualized skeletal structures are unremarkable. IMPRESSION: No active disease. Electronically Signed   By: Alcide Clever M.D.   On: 07/09/2023 00:01   DG Femur Min 2 Views Right Result Date: 07/08/2023 CLINICAL DATA:  Fall, right hip pain EXAM: RIGHT FEMUR 2 VIEWS COMPARISON:  None Available. FINDINGS: There is a right femoral intertrochanteric fracture with mild displacement. No significant angulation. No additional acute bony abnormality. Mild degenerative changes in the right hip and knee joints. IMPRESSION: Right femoral intertrochanteric fracture. Electronically Signed   By: Charlett Nose M.D.   On: 07/08/2023 23:30   DG Hip Unilat  With Pelvis 2-3 Views Right Result Date: 07/08/2023 CLINICAL DATA:  Right hip pain EXAM: DG HIP (WITH OR WITHOUT PELVIS) 2-3V RIGHT COMPARISON:  None Available. FINDINGS: Right femoral intertrochanteric fracture noted. No significant angulation. Mild displacement. No subluxation or dislocation. IMPRESSION: Right femoral intertrochanteric fracture. Electronically Signed   By: Charlett Nose M.D.   On: 07/08/2023 23:30  Family Communication: Daughter at bedside     Time spent: 35 minutes  Author: Charlesetta Connors, DO 07/09/2023 12:38 PM  For on call review [email or web address].

## 2023-07-09 NOTE — Consult Note (Signed)
 ORTHOPAEDIC CONSULTATION  REQUESTING PHYSICIAN: Krugh, Marissa C, DO  Chief Complaint:   Right intertrochanteric femur fracture  History of Present Illness: Shelly Armstrong is a 78 y.o. female with medical history significant for HTN, HLD who presented to the emergency room last night after she tripped over some socks on a carpeted floor landing on her right side.  She denies any presyncopal or syncopal symptoms any loss of consciousness.  She reports he was unable to get up after falling landing on her right side due to right thigh and hip pain.  She was found to have an intertrochanteric fracture orthopedics was consulted for evaluation and treatment.  Patient denies any pre-existing hip pain prior to the fall.  She denies any pain of the knee, foot ankle or toes.  Denies any pain in the left leg or upper extremities.  Past Medical History:  Diagnosis Date   Chickenpox    Edema    feet/legs   Hyperglycemia    Hyperlipidemia    Hypertension    Osteopenia    PONV (postoperative nausea and vomiting)    after hand surgery   Past Surgical History:  Procedure Laterality Date   ABDOMINAL HYSTERECTOMY     BREAST EXCISIONAL BIOPSY Left 2001   stereotactic biopsy - negative   BREAST SURGERY     Breast excisional biopsy   CATARACT EXTRACTION W/PHACO Right 10/19/2014   Procedure: CATARACT EXTRACTION PHACO AND INTRAOCULAR LENS PLACEMENT (IOC);  Surgeon: Galen Manila, MD;  Location: [facility] ORS;  Service: Ophthalmology;  Laterality: Right;  casette lot# [id number]   [place]  00:29 AP17.7 CDE 5.09   CATARACT EXTRACTION W/PHACO Left 11/02/2014   Procedure: CATARACT EXTRACTION PHACO AND INTRAOCULAR LENS PLACEMENT (IOC);  Surgeon: Galen Manila, MD;  Location: [facility] ORS;  Service: Ophthalmology;  Laterality: Left;  [place]: 00:34.2 AP%: 22.5 CDE: 7.71  Fluid lot# [id number]   CHOLECYSTECTOMY     COLONOSCOPY WITH PROPOFOL N/A 08/31/2014    Procedure: COLONOSCOPY WITH PROPOFOL;  Surgeon: Christena Deem, MD;  Location: [facility] ENDOSCOPY;  Service: Endoscopy;  Laterality: N/A;   ctr     ESOPHAGOGASTRODUODENOSCOPY N/A 08/31/2014   Procedure: ESOPHAGOGASTRODUODENOSCOPY (EGD);  Surgeon: Christena Deem, MD;  Location: [facility] ENDOSCOPY;  Service: Endoscopy;  Laterality: N/A;   ESOPHAGOGASTRODUODENOSCOPY (EGD) WITH PROPOFOL N/A 08/16/2020   Procedure: ESOPHAGOGASTRODUODENOSCOPY (EGD) WITH PROPOFOL;  Surgeon: Regis Bill, MD;  Location: [facility] ENDOSCOPY;  Service: Endoscopy;  Laterality: N/A;   FRACTURE SURGERY     Jaw fx repair; Hand procedure after trauma   IR KYPHO THORACIC WITH BONE BIOPSY  04/09/2023   IR RADIOLOGIST EVAL & MGMT  04/02/2023   IR RADIOLOGIST EVAL & MGMT  04/25/2023   MANDIBLE FRACTURE SURGERY     WRIST SURGERY     CTR   Social History   Socioeconomic History   Marital status: Married    Spouse name: Not on file   Number of children: Not on file   Years of education: Not on file   Highest education level: Not on file  Occupational History   Not on file  Tobacco Use   Smoking status: Never   Smokeless tobacco: Never  Vaping Use   Vaping status: Never Used  Substance and Sexual Activity   Alcohol use: No   Drug use: No   Sexual activity: Not on file  Other Topics Concern   Not on file  Social History Narrative   Lives with husband at home, independent and still active, working  Social Drivers of Corporate investment banker Strain: Low Risk  (07/04/2023)   Received from [facility]   Overall Financial Resource Strain (CARDIA)    Difficulty of Paying Living Expenses: Not hard at all  Food Insecurity: No Food Insecurity (07/04/2023)   Received from [facility]   Hunger Vital Sign    Worried About Running Out of Food in the Last Year: Never true    Ran Out of Food in the Last Year: Never true  Transportation Needs: No Transportation Needs (07/04/2023)   Received  from [facility] - Transportation    In the past 12 months, has lack of transportation kept you from medical appointments or from getting medications?: No    Lack of Transportation (Non-Medical): No  Physical Activity: Not on file  Stress: Not on file  Social Connections: Not on file   Family History  Problem Relation Age of Onset   Heart failure Mother    CAD Father    Breast cancer Neg Hx    Allergies  Allergen Reactions   Sulfamethoxazole-Trimethoprim Rash and Other (See Comments)   Prior to Admission medications   Medication Sig Start Date End Date Taking? Authorizing Provider  acetaminophen (TYLENOL) 500 MG tablet Take 1,000 mg by mouth every 6 (six) hours as needed for moderate pain.   Yes [provider]  aspirin 81 MG tablet Take 81 mg by mouth daily.   Yes [provider]  atorvastatin (LIPITOR) 40 MG tablet Take 40 mg by mouth daily.   Yes [provider]  Calcium-Vitamin D (CALTRATE 600 PLUS-VIT D PO) Take 1 tablet by mouth 2 (two) times daily.   Yes [provider]  carvedilol (COREG) 6.25 MG tablet Take 6.25 mg by mouth 2 (two) times daily with a meal. 02/27/23 02/27/24 Yes [provider]  CONSTULOSE 10 GM/15ML solution Take 10 g by mouth daily as needed. 07/06/23  Yes [provider]  dicyclomine (BENTYL) 10 MG capsule Take 10 mg by mouth 4 (four) times daily -  before meals and at bedtime.   Yes [provider]  MIEBO 1.338 GM/ML SOLN Apply 1 drop to eye 4 (four) times daily. 04/25/23  Yes [provider]  Multiple Vitamins-Minerals (CENTRUM SILVER PO) Take 1 tablet by mouth daily.   Yes [provider]  olmesartan-hydrochlorothiazide (BENICAR HCT) 40-25 MG tablet  08/04/18  Yes [provider]  pantoprazole (PROTONIX) 40 MG tablet Take 40 mg by mouth daily.   Yes [provider]  Polyethylene Glycol 3350-GRX POWD Take 17 g by mouth daily as needed.    Yes [provider]  senna (SENOKOT) 8.6 MG TABS tablet Take 1 tablet by mouth.   Yes [provider]   DG Chest Port 1 View Result Date: 07/09/2023 CLINICAL DATA:  Known right hip fracture preoperative evaluation EXAM: PORTABLE CHEST 1 VIEW COMPARISON:  12/03/2016 FINDINGS: The heart size and mediastinal contours are within normal limits. Both lungs are clear. The visualized skeletal structures are unremarkable. IMPRESSION: No active disease. Electronically Signed   By: Violeta Grey M.D.   On: 07/09/2023 00:01   DG Femur Min 2 Views Right Result Date: 07/08/2023 CLINICAL DATA:  Fall, right hip pain EXAM: RIGHT FEMUR 2 VIEWS COMPARISON:  None Available. FINDINGS: There is a right femoral intertrochanteric fracture with mild displacement. No significant angulation. No additional acute bony abnormality. Mild degenerative changes in the right hip and knee joints.  IMPRESSION: Right femoral intertrochanteric fracture. Electronically Signed   By: Charlett Nose M.D.   On: 07/08/2023 23:30   DG Hip Unilat  With Pelvis 2-3 Views Right Result Date: 07/08/2023 CLINICAL DATA:  Right hip pain EXAM: DG HIP (WITH OR WITHOUT PELVIS) 2-3V RIGHT COMPARISON:  None Available. FINDINGS: Right femoral intertrochanteric fracture noted. No significant angulation. Mild displacement. No subluxation or dislocation. IMPRESSION: Right femoral intertrochanteric fracture. Electronically Signed   By: Charlett Nose M.D.   On: 07/08/2023 23:30    Positive ROS: All other systems have been reviewed and were otherwise negative with the exception of those mentioned in the HPI and as above.  Physical Exam: General:  Alert, no acute distress Psychiatric:  Patient is competent for consent with normal mood and affect   Cardiovascular:  No pedal edema Respiratory:  No wheezing, non-labored breathing GI:  Abdomen is soft and non-tender Skin:  No lesions in the area of chief complaint Neurologic:  Sensation intact  distally Lymphatic:  No axillary or cervical lymphadenopathy  Orthopedic Exam:  Right lower extremity Skin intact over the hip tender to palpation over the greater trochanter No tenderness palpation of the distal femur, knee, tibia, foot or ankle Tenderness localized to the hip with any motion of the lower extremity Compartments all soft Neurovascular intact able to dorsiflex and plantarflex the foot and toes Intact dorsalis pedis pulse with brisk capillary refill  Secondary survey No tenderness to palpation over other bony prominences in the lower extremities or bilateral upper extremities No pain with logroll or simulated axial loading of the left lower extremity All compartments soft No tenderness to palpation over the cervical or thoracic spine, no bony step-off Motor grossly intact throughout, no focal deficits Sensation grossly intact throughout, no focal deficits Good distal pulses and capillary refill on all extremities   X-rays:  X-rays reviewed which show a right intertrochanteric femur fracture no fractures noted of the distal femur or knee.  No degenerative changes noted to the knee.  Agree with radiology interpretation  Assessment: Right intertrochanteric femur fracture  Plan: Camila is a 78 year old female presents with a displaced right intertrochanteric femur fracture.  We discussed treatment options including operative and nonoperative treatment under shared decision making model the patient has elected to move forward with surgical intervention for her right hip.  The patient will need a intramedullary nail.  A long discussion took place with the patient describing what a intramedullary nail is and what the procedure would entail. The xrays were reviewed with the patient and the implants were discussed. The ability to secure the implant utilizing screws and blade fixation was discussed. Surgical exposures were discussed with the patient.    The hospitalization and  post-operative care and rehabilitation were also discussed. The use of perioperative antibiotics and DVT prophylaxis were discussed. The risk, benefits and alternatives to a surgical intervention were discussed at length with the patient. The patient was also advised of risks related to the medical comorbidities. A lengthy discussion took place to review the most common complications including but not limited to: deep vein thrombosis, pulmonary embolus, heart attack, stroke, infection, wound breakdown, dislocation, numbness, leg length in-equality, damage to nerves, intraoperative fracture, screw cut out, hardware breakage, malunion/ nonunion, tendon,muscles, arteries or other blood vessels, death and other possible complications from anesthesia. The patient was told that we will take steps to minimize these risks by using sterile technique, antibiotics and DVT prophylaxis when appropriate and follow the patient postoperatively in the office setting  to monitor progress. The possibility of recurrent pain, no improvement in pain and actual worsening of pain were also discussed with the patient.      The benefits of surgery were discussed with the patient including the potential for improving the patient's current clinical condition through operative intervention. Alternatives to surgical intervention including conservative management were also discussed in detail. All questions were answered to the satisfaction of the patient. The patient participated and agreed to the plan of care as well as the use of the recommended implants for their surgery.    Plan for surgery today 07/09/2023 for right hip intramedullary nail N.p.o.for the operating room Hold anticoagulation  Consent is signed and at patient's bedside in ER room 3.  Nurse aware of the consent. Will come and mark the patient in preoperative holding  Venus Ginsberg MD     Venus Ginsberg MD  Beeper #:  [phone]  07/09/2023 9:48 AM

## 2023-07-10 DIAGNOSIS — E871 Hypo-osmolality and hyponatremia: Secondary | ICD-10-CM | POA: Diagnosis not present

## 2023-07-10 DIAGNOSIS — I160 Hypertensive urgency: Secondary | ICD-10-CM | POA: Diagnosis not present

## 2023-07-10 DIAGNOSIS — S72141A Displaced intertrochanteric fracture of right femur, initial encounter for closed fracture: Secondary | ICD-10-CM | POA: Diagnosis not present

## 2023-07-10 DIAGNOSIS — E440 Moderate protein-calorie malnutrition: Secondary | ICD-10-CM | POA: Diagnosis not present

## 2023-07-10 LAB — CORTISOL-AM, BLOOD: Cortisol - AM: 4 ug/dL — ABNORMAL LOW (ref 6.7–22.6)

## 2023-07-10 LAB — HEPATIC FUNCTION PANEL
ALT: 24 U/L (ref 0–44)
AST: 30 U/L (ref 15–41)
Albumin: 3.3 g/dL — ABNORMAL LOW (ref 3.5–5.0)
Alkaline Phosphatase: 44 U/L (ref 38–126)
Bilirubin, Direct: 0.1 mg/dL (ref 0.0–0.2)
Indirect Bilirubin: 0.5 mg/dL (ref 0.3–0.9)
Total Bilirubin: 0.6 mg/dL (ref 0.0–1.2)
Total Protein: 5.3 g/dL — ABNORMAL LOW (ref 6.5–8.1)

## 2023-07-10 LAB — CBC
HCT: 28.4 % — ABNORMAL LOW (ref 36.0–46.0)
Hemoglobin: 10.3 g/dL — ABNORMAL LOW (ref 12.0–15.0)
MCH: 33.3 pg (ref 26.0–34.0)
MCHC: 36.3 g/dL — ABNORMAL HIGH (ref 30.0–36.0)
MCV: 91.9 fL (ref 80.0–100.0)
Platelets: 154 10*3/uL (ref 150–400)
RBC: 3.09 MIL/uL — ABNORMAL LOW (ref 3.87–5.11)
RDW: 12.6 % (ref 11.5–15.5)
WBC: 9.2 10*3/uL (ref 4.0–10.5)
nRBC: 0 % (ref 0.0–0.2)

## 2023-07-10 LAB — BASIC METABOLIC PANEL WITH GFR
Anion gap: 7 (ref 5–15)
BUN: 16 mg/dL (ref 8–23)
CO2: 23 mmol/L (ref 22–32)
Calcium: 8.6 mg/dL — ABNORMAL LOW (ref 8.9–10.3)
Chloride: 92 mmol/L — ABNORMAL LOW (ref 98–111)
Creatinine, Ser: 0.52 mg/dL (ref 0.44–1.00)
GFR, Estimated: >60 mL/min (ref >60)
Glucose, Bld: 154 mg/dL — ABNORMAL HIGH (ref 70–99)
Potassium: 3.9 mmol/L (ref 3.5–5.1)
Sodium: 122 mmol/L — ABNORMAL LOW (ref 135–145)

## 2023-07-10 LAB — OSMOLALITY: Osmolality: 253 mosm/kg — ABNORMAL LOW (ref 275–295)

## 2023-07-10 MED ORDER — SODIUM CHLORIDE 1 G PO TABS
1.0000 g | ORAL_TABLET | Freq: Two times a day (BID) | ORAL | Status: DC
  Administered 2023-07-10 – 2023-07-12 (×4): 1 g via ORAL
  Filled 2023-07-10 (×6): qty 1

## 2023-07-10 MED ORDER — ADULT MULTIVITAMIN W/MINERALS CH
1.0000 | ORAL_TABLET | Freq: Every day | ORAL | Status: DC
  Administered 2023-07-10 – 2023-07-16 (×7): 1 via ORAL
  Filled 2023-07-10 (×7): qty 1

## 2023-07-10 MED ORDER — ENSURE ENLIVE PO LIQD
237.0000 mL | Freq: Two times a day (BID) | ORAL | Status: DC
  Administered 2023-07-10 – 2023-07-16 (×11): 237 mL via ORAL

## 2023-07-10 MED ORDER — FE FUM-VIT C-VIT B12-FA 460-60-0.01-1 MG PO CAPS
1.0000 | ORAL_CAPSULE | Freq: Every day | ORAL | Status: DC
  Administered 2023-07-10 – 2023-07-11 (×2): 1 via ORAL
  Filled 2023-07-10 (×2): qty 1

## 2023-07-10 NOTE — Progress Notes (Signed)
 Subjective: Patient is POD 1 from right hip intramedullary nail. She reports her pain is well controlled. She was able to stand and transfer with PT. Has good appetite and is eating well. Denies SOB, CP, N, V.   Objective: Vital signs in last 24 hours: Temp:  [97.1 F (36.2 C)-98.1 F (36.7 C)] 98 F (36.7 C) (04/16 0758) Pulse Rate:  [62-84] 69 (04/16 0758) Resp:  [10-21] 16 (04/16 0758) BP: (104-177)/(48-103) 127/56 (04/16 0758) SpO2:  [83 %-100 %] 98 % (04/16 0758)  Intake/Output from previous day: 04/15 0701 - 04/16 0700 In: 1680 [P.O.:480; I.V.:700; IV Piggyback:500] Out: 1500 [Urine:1300; Blood:200] Intake/Output this shift: Total I/O In: 120 [P.O.:120] Out: -   Recent Labs    07/08/23 2348 07/09/23 2213 07/10/23 0417  HGB 11.7* 11.6* 10.3*   Recent Labs    07/09/23 2213 07/10/23 0417  WBC 11.3* 9.2  RBC 3.42* 3.09*  HCT 31.5* 28.4*  PLT 149* 154   Recent Labs    07/09/23 2213 07/10/23 0417  NA 123* 122*  K 3.8 3.9  CL 92* 92*  CO2 24 23  BUN 14 16  CREATININE 0.49 0.52  GLUCOSE 221* 154*  CALCIUM 8.6* 8.6*   No results for input(s): "LABPT", "INR" in the last 72 hours.  Physical Exam: Right lower extremity exam Two closed well appearing incisions on right lateral hip NO drainage or erythema Mild swelling to hip/thigh Compartments all soft NVI distally, negative homans, able to dorsiflex and plantar flex foot and toes  Assessment: S/p right hip intramedullary nail for IT Fx  Plan: Continue with PT, WBAT, pain control, TOC team for SNF planning Hyponatremia management per medicine team All questions answered  Venus Ginsberg 07/10/2023, 12:48 PM

## 2023-07-10 NOTE — Hospital Course (Addendum)
 Taken from prior notes.  Shelly Armstrong is a 78 y.o. female with medical history significant for HTN, HLD admitted with displaced right intertrochanteric femur fracture following mechanical fall.  Orthopedic surgery was consulted and patient was taken to the OR on/15/25  4/16: S/p ORIF.  Labs with sodium of 122, chloride 92, leukocytosis resolved, hemoglobin of 10.3 decreased from 11.7 on admission likely due to intraoperative blood loss.  Starting on supplement. Hyponatremia labs ordered.  Hyponatremia seems chronic patient was on HCTZ at home which was discontinued.  Slight decrease in sodium after getting normal saline.  A.m. cortisol low at 4, serum osmolality low at 253, pending urine studies.  Will repeat a.m. cortisol tomorrow and also starting on salt tablet.  4/17: Worsening sodium, came back 116 with a.m. lab, given 1 L bolus of normal saline by night on-call provider-repeat sodium even lower at 115 Patient was feeling more lethargic and weak-transferring to stepdown, nephrology consult and starting on hypertonic saline. Hemoglobin with another significant drop to 8.3, no obvious bleeding but obviously patient recently had a hip fracture and surgery. Repeat a.m. cortisol levels at 11.7  4/18: Vital stable, sodium at 119>>121-patient remained on hypertonic saline, urine studies with sodium less than 10 and decreased urine osmolality at 196. Anemia panel with no significant abnormality.

## 2023-07-10 NOTE — Evaluation (Signed)
 Physical Therapy Evaluation Patient Details Name: Shelly Armstrong MRN: [id number] DOB: 04/26/1945 Today's Date: 07/10/2023  History of Present Illness  presented to ER status post mechanical fall in home environment, acute onset of R hip pain; admitted for management of closed intertrochanteric hip fracture, s/p IM nailing (07/09/23), WBAT.  Clinical Impression  Patient resting in bed upon arrival to room; sister present at bedside (stepping out during session to allow for full participation).  Patient alert and oriented, follows commands and agreeable to participation with treatment session.  Endorses R hip pain 6-7/10; meds received prior to session.  Demonstrates fair R hip strength and ROM; generally slow and deliberate in movement, limited at end-ranges by pain.  Currently requiring mod assist for bed mobility; min/mod assist for sit/stand, standing balance, bed/chair transfer with RW; min assist for gait (12') with Rw.  Demonstrates step to progressing to partially reciprocal stepping pattern; fair WBing and stance time R LE.  Slow and deliberate; increased time/effort for R LE advancement.  Mod WBing bilat UEs to offset R LE as needed.  Continue to recommend RW and +1 for optimal safety at this time.  Patient very motivated to participate/progress; anticipate consistent progress towards goals with consistent pain control and therapy services. Would benefit from skilled PT to address above deficits and promote optimal return to PLOF.; recommend post-acute PT follow up as indicated by interdisciplinary care team.            If plan is discharge home, recommend the following: A lot of help with walking and/or transfers;A lot of help with bathing/dressing/bathroom   Can travel by private vehicle        Equipment Recommendations    Recommendations for Other Services       Functional Status Assessment Patient has had a recent decline in their functional status and demonstrates the ability  to make significant improvements in function in a reasonable and predictable amount of time.     Precautions / Restrictions Precautions Precautions: Fall Restrictions Weight Bearing Restrictions Per Provider Order: Yes RLE Weight Bearing Per Provider Order: Weight bearing as tolerated      Mobility  Bed Mobility Overal bed mobility: Needs Assistance Bed Mobility: Supine to Sit     Supine to sit: Mod assist          Transfers Overall transfer level: Needs assistance Equipment used: Rolling walker (2 wheels) Transfers: Sit to/from Stand, Bed to chair/wheelchair/BSC Sit to Stand: Min assist, Mod assist Stand pivot transfers: Min assist, Mod assist         General transfer comment: cuing for hand/foot placement to manage pain, optimize mechanics and safety    Ambulation/Gait Ambulation/Gait assistance: Min assist Gait Distance (Feet): 12 Feet Assistive device: Rolling walker (2 wheels)         General Gait Details: step to progressing to partially reciprocal stepping pattern; fair WBing and stance time R LE.  Slow and deliberate; increased time/effort for R LE advancement.  Mod WBing bilat UEs to offset R LE as needed.  Continue to recommend RW and +1 for optimal safety at this time  Stairs            Wheelchair Mobility     Tilt Bed    Modified Rankin (Stroke Patients Only)       Balance Overall balance assessment: Needs assistance Sitting-balance support: No upper extremity supported, Feet supported Sitting balance-Leahy Scale: Good     Standing balance support: Bilateral upper extremity supported Standing balance-Leahy Scale: Fair  Pertinent Vitals/Pain Pain Assessment Pain Assessment: Faces Faces Pain Scale: Hurts even more Pain Location: R hip Pain Intervention(s): Limited activity within patient's tolerance, Monitored during session, Premedicated before session, Repositioned    Home Living  Family/patient expects to be discharged to:: Private residence Living Arrangements: Spouse/significant other Available Help at Discharge: Family Type of Home: House Home Access: Stairs to enter Entrance Stairs-Rails: None Entrance Stairs-Number of Steps: 3   Home Layout: One level        Prior Function Prior Level of Function : Independent/Modified Independent             Mobility Comments: Indep with ADLs, household and community mobilization; + driving, denies additional fall history outside of this admission.  Husband with multiple medical issues; patient largely responsible for majority of household responsibilites and community errands.       Extremity/Trunk Assessment   Upper Extremity Assessment Upper Extremity Assessment: Overall WFL for tasks assessed    Lower Extremity Assessment Lower Extremity Assessment: Overall WFL for tasks assessed (R LE grossly 3-/5, limited by pain)       Communication        Cognition Arousal: Alert Behavior During Therapy: WFL for tasks assessed/performed   PT - Cognitive impairments: No apparent impairments                                 Cueing       General Comments      Exercises Other Exercises Other Exercises: Supine LE therex, 1x10, act/act assist ROM for R LE strength/flexibility.  Hip end-range limited by pain; performed within tolerable range   Assessment/Plan    PT Assessment Patient needs continued PT services  PT Problem List Decreased strength;Decreased range of motion;Decreased activity tolerance;Decreased balance;Decreased mobility;Decreased coordination;Decreased knowledge of use of DME;Decreased safety awareness;Decreased knowledge of precautions       PT Treatment Interventions DME instruction;Gait training;Stair training;Functional mobility training;Therapeutic activities;Therapeutic exercise;Balance training;Patient/family education    PT Goals (Current goals can be found in the Care  Plan section)  Acute Rehab PT Goals Patient Stated Goal: to return home PT Goal Formulation: With patient Time For Goal Achievement: 07/24/23 Potential to Achieve Goals: Good    Frequency BID     Co-evaluation               AM-PAC PT "6 Clicks" Mobility  Outcome Measure Help needed turning from your back to your side while in a flat bed without using bedrails?: A Little Help needed moving from lying on your back to sitting on the side of a flat bed without using bedrails?: A Lot Help needed moving to and from a bed to a chair (including a wheelchair)?: A Lot Help needed standing up from a chair using your arms (e.g., wheelchair or bedside chair)?: A Little Help needed to walk in hospital room?: A Little Help needed climbing 3-5 steps with a railing? : A Lot 6 Click Score: 15    End of Session Equipment Utilized During Treatment: Gait belt Activity Tolerance: Patient tolerated treatment well Patient left: in chair;with call bell/phone within reach;with chair alarm set Nurse Communication: Mobility status PT Visit Diagnosis: Muscle weakness (generalized) (M62.81);Difficulty in walking, not elsewhere classified (R26.2);Pain Pain - Right/Left: Right Pain - part of body: Hip    Time: 1610-9604 PT Time Calculation (min) (ACUTE ONLY): 25 min   Charges:   PT Evaluation $PT Eval Moderate Complexity: 1 Mod PT Treatments $  Therapeutic Exercise: 8-22 mins PT General Charges $$ ACUTE PT VISIT: 1 Visit         Samir Ishaq H. Bevin Bucks, PT, DPT, NCS 07/10/23, 12:28 PM [phone]

## 2023-07-10 NOTE — Anesthesia Postprocedure Evaluation (Signed)
 Anesthesia Post Note  Patient: Shelly Armstrong  Procedure(s) Performed: FIXATION, FRACTURE, INTERTROCHANTERIC, WITH INTRAMEDULLARY ROD (Right: Hip)  Patient location during evaluation: Nursing Unit Anesthesia Type: Spinal Level of consciousness: oriented and awake and alert Pain management: pain level controlled Vital Signs Assessment: post-procedure vital signs reviewed and stable Respiratory status: spontaneous breathing, respiratory function stable and patient connected to nasal cannula oxygen Cardiovascular status: blood pressure returned to baseline and stable Postop Assessment: no headache, no backache and no apparent nausea or vomiting Anesthetic complications: no  No notable events documented.   Last Vitals:  Vitals:   07/10/23 0500 07/10/23 0758  BP: 130/60 (!) 127/56  Pulse: 67 69  Resp: 19 16  Temp:  36.7 C  SpO2: 96% 98%    Last Pain:  Vitals:   07/10/23 0758  TempSrc: Oral  PainSc:                  Baby Lessen

## 2023-07-10 NOTE — Progress Notes (Signed)
 Physical Therapy Treatment Patient Details Name: Shelly Armstrong MRN: [id number] DOB: 03/26/1946 Today's Date: 07/10/2023   History of Present Illness presented to ER status post mechanical fall in home environment, acute onset of R hip pain; admitted for management of closed intertrochanteric hip fracture, s/p IM nailing (07/09/23), WBAT.    PT Comments  Patient received on toilet (ambulated from chair to toilet with RN); unable to successfully void.  Assisted from standard toilet to recliner, mod assist for transfer from lower surface.  Did endorse significant nausea, dizziness, lightheadedness post toileting trial; unable to tolerate additional standing/gait efforts as result (BP recliner in chair 114/60s).  Assisted back to bed via squat pivot, max assist +2, for safety purposes; RN at bedside for assessment.  Will continue mobility progression next date as medically appropriate; may benefit from orthostatic assessment if symptoms persist.   If plan is discharge home, recommend the following: A lot of help with walking and/or transfers;A lot of help with bathing/dressing/bathroom   Can travel by private vehicle        Equipment Recommendations       Recommendations for Other Services       Precautions / Restrictions Precautions Precautions: Fall Restrictions Weight Bearing Restrictions Per Provider Order: Yes RLE Weight Bearing Per Provider Order: Weight bearing as tolerated     Mobility  Bed Mobility Overal bed mobility: Needs Assistance Bed Mobility: Sit to Supine     Supine to sit: Mod assist Sit to supine: Mod assist        Transfers Overall transfer level: Needs assistance Equipment used: Rolling walker (2 wheels) Transfers: Sit to/from Stand Sit to Stand: Mod assist Stand pivot transfers: Mod assist         General transfer comment: increased assist from lower surface heights (standard toilet)    Ambulation/Gait Ambulation/Gait assistance: Min  assist Gait Distance (Feet): 12 Feet Assistive device: Rolling walker (2 wheels)         General Gait Details: unable to tolerate this PM due to fatigue, dizziness, lightheadedness   Stairs             Wheelchair Mobility     Tilt Bed    Modified Rankin (Stroke Patients Only)       Balance Overall balance assessment: Needs assistance Sitting-balance support: No upper extremity supported, Feet supported Sitting balance-Leahy Scale: Fair     Standing balance support: Bilateral upper extremity supported Standing balance-Leahy Scale: Fair                              Communication    Cognition Arousal: Alert Behavior During Therapy: WFL for tasks assessed/performed   PT - Cognitive impairments: No apparent impairments                                Cueing    Exercises Other Exercises Other Exercises: Squat pivot, chair to bed, mod/max assist +2 for lift off, lateral movement    General Comments        Pertinent Vitals/Pain Pain Assessment Pain Assessment: Faces Faces Pain Scale: Hurts whole lot Pain Location: R hip Pain Descriptors / Indicators: Aching, Grimacing, Guarding Pain Intervention(s): Limited activity within patient's tolerance, Monitored during session, Repositioned    Home Living  Prior Function            PT Goals (current goals can now be found in the care plan section) Acute Rehab PT Goals Patient Stated Goal: to return home PT Goal Formulation: With patient Time For Goal Achievement: 07/24/23 Potential to Achieve Goals: Good Progress towards PT goals: Progressing toward goals    Frequency    BID      PT Plan      Co-evaluation              AM-PAC PT "6 Clicks" Mobility   Outcome Measure  Help needed turning from your back to your side while in a flat bed without using bedrails?: A Little Help needed moving from lying on your back to sitting on the  side of a flat bed without using bedrails?: A Lot Help needed moving to and from a bed to a chair (including a wheelchair)?: A Lot Help needed standing up from a chair using your arms (e.g., wheelchair or bedside chair)?: A Lot Help needed to walk in hospital room?: A Lot Help needed climbing 3-5 steps with a railing? : A Lot 6 Click Score: 13    End of Session Equipment Utilized During Treatment: Gait belt Activity Tolerance: Patient tolerated treatment well Patient left: in bed;with bed alarm set;with family/visitor present;with call bell/phone within reach Nurse Communication: Mobility status PT Visit Diagnosis: Muscle weakness (generalized) (M62.81);Difficulty in walking, not elsewhere classified (R26.2);Pain Pain - Right/Left: Right Pain - part of body: Hip     Time: 1550-1609 PT Time Calculation (min) (ACUTE ONLY): 19 min  Charges:    $Therapeutic Exercise: 8-22 mins $Therapeutic Activity: 8-22 mins PT General Charges $$ ACUTE PT VISIT: 1 Visit                     Rani Sisney H. Bevin Bucks, PT, DPT, NCS 07/10/23, 4:22 PM [phone]

## 2023-07-10 NOTE — Plan of Care (Signed)
 Pt alert, transferred from [facility]. Vitals stable. Problem: Education: Goal: Knowledge of General Education information will improve Description: Including pain rating scale, medication(s)/side effects and non-pharmacologic comfort measures Outcome: Progressing   Problem: Health Behavior/Discharge Planning: Goal: Ability to manage health-related needs will improve Outcome: Progressing   Problem: Clinical Measurements: Goal: Ability to maintain clinical measurements within normal limits will improve Outcome: Progressing Goal: Will remain free from infection Outcome: Progressing Goal: Diagnostic test results will improve Outcome: Progressing Goal: Respiratory complications will improve Outcome: Progressing Goal: Cardiovascular complication will be avoided Outcome: Progressing   Problem: Activity: Goal: Risk for activity intolerance will decrease Outcome: Progressing   Problem: Nutrition: Goal: Adequate nutrition will be maintained Outcome: Progressing   Problem: Coping: Goal: Level of anxiety will decrease Outcome: Progressing   Problem: Elimination: Goal: Will not experience complications related to bowel motility Outcome: Progressing Goal: Will not experience complications related to urinary retention Outcome: Progressing   Problem: Pain Managment: Goal: General experience of comfort will improve and/or be controlled Outcome: Progressing   Problem: Safety: Goal: Ability to remain free from injury will improve Outcome: Progressing   Problem: Skin Integrity: Goal: Risk for impaired skin integrity will decrease Outcome: Progressing   Problem: Education: Goal: Verbalization of understanding the information provided (i.e., activity precautions, restrictions, etc) will improve Outcome: Progressing Goal: Individualized Educational Video(s) Outcome: Progressing   Problem: Activity: Goal: Ability to ambulate and perform ADLs will improve Outcome: Progressing    Problem: Clinical Measurements: Goal: Postoperative complications will be avoided or minimized Outcome: Progressing   Problem: Self-Concept: Goal: Ability to maintain and perform role responsibilities to the fullest extent possible will improve Outcome: Progressing   Problem: Pain Management: Goal: Pain level will decrease Outcome: Progressing

## 2023-07-10 NOTE — Progress Notes (Signed)
  Progress Note   Patient: Shelly Armstrong ZOX:[id number] DOB: 12-12-1945 DOA: 07/08/2023     1 DOS: the patient was seen and examined on 07/10/2023   Brief hospital course: Taken from prior notes.  Shelly Armstrong is a 78 y.o. female with medical history significant for HTN, HLD admitted with displaced right intertrochanteric femur fracture following mechanical fall.  Orthopedic surgery was consulted and patient was taken to the OR on/15/25  4/16: S/p ORIF.  Labs with sodium of 122, chloride 92, leukocytosis resolved, hemoglobin of 10.3 decreased from 11.7 on admission likely due to intraoperative blood loss.  Starting on supplement. Hyponatremia labs ordered.  Hyponatremia seems chronic patient was on HCTZ at home which was discontinued.  Slight decrease in sodium after getting normal saline.  A.m. cortisol low at 4, serum osmolality low at 253, pending urine studies.  Will repeat a.m. cortisol tomorrow and also starting on salt tablet.  Assessment and Plan: * Closed intertrochanteric fracture of hip, right, initial encounter (HCC) Accidental fall Preoperative clearance was obtained S/p ORIF-tolerated the procedure well.  She will be weightbearing as tolerated per orthopedic surgery PT/OT are recommending SNF -Continue with supportive care and pain management  Hypertensive urgency Blood pressure now within goal.  It was significantly elevated on admission likely due to pain. -Continue home irbesartan -Home HCTZ was discontinued due to hyponatremia  Chronic hyponatremia Seems chronic and slight worsening with normal saline.  Patient was also on HCTZ at home which is likely contributory.  Hyponatremia labs with slightly low serum osmolality, urine studies still pending.  TSH normal, a.m. cortisol low at 4.0 Suspect SIADH -Adding salt tablet -Will repeat a.m. cortisol  Malnutrition of moderate degree Dietitian consult      Subjective: Patient was sitting in chair when seen  today.  Still having some pain at surgical site.  Sister at bedside  Physical Exam: Vitals:   07/10/23 0037 07/10/23 0500 07/10/23 0758 07/10/23 1620  BP: (!) 116/56 130/60 (!) 127/56 (!) 113/59  Pulse: 70 67 69 71  Resp: 18 19 16    Temp: 97.8 F (36.6 C)  98 F (36.7 C) 97.9 F (36.6 C)  TempSrc: Oral  Oral   SpO2: 95% 96% 98% 96%  Weight:      Height:       General.  Frail elderly lady, in no acute distress. Pulmonary.  Lungs clear bilaterally, normal respiratory effort. CV.  Regular rate and rhythm, no JVD, rub or murmur. Abdomen.  Soft, nontender, nondistended, BS positive. CNS.  Alert and oriented .  No focal neurologic deficit. Extremities.  No edema, no cyanosis, pulses intact and symmetrical. Psychiatry.  Judgment and insight appears normal.   Data Reviewed: Prior data reviewed  Family Communication: Discussed with sister at bedside  Disposition: Status is: Inpatient Remains inpatient appropriate because: Severity of illness  Planned Discharge Destination: Skilled nursing facility  DVT prophylaxis.  Lovenox Time spent: 45 minutes  This record has been created using Conservation officer, historic buildings. Errors have been sought and corrected,but may not always be located. Such creation errors do not reflect on the standard of care.   Author: Luna Salinas, MD 07/10/2023 4:27 PM  For on call review [email or web address].

## 2023-07-10 NOTE — Progress Notes (Signed)
 Initial Nutrition Assessment  DOCUMENTATION CODES:   Non-severe (moderate) malnutrition in context of chronic illness  INTERVENTION:   -MVI with minerals daily -Liberalize diet to regular for widest variety of meal selections -Ensure Enlive po BID, each supplement provides 350 kcal and 20 grams of protein.   NUTRITION DIAGNOSIS:   Moderate Malnutrition related to social / environmental circumstances as evidenced by mild fat depletion, mild muscle depletion, moderate muscle depletion, percent weight loss.  GOAL:   Patient will meet greater than or equal to 90% of their needs  MONITOR:   PO intake, Supplement acceptance  REASON FOR ASSESSMENT:   Consult Hip fracture protocol  ASSESSMENT:   Pt with medical history significant for HTN, HLD admitted with displaced right intertrochanteric femur fracture following mechanical fall.  Pt admitted with rt femur fracture s/p mechanical fall.   4/15- s/p Right Hip Intramedullary nail   Reviewed I/O's: +180 ml x 24 hours and +1.2 L since admission  UOP: 1.3 L x 24 hours  Spoke with pt and family members at bedside. Pt complains of not feeling well today, complaining of indigestion after eating breakfast. Noted pt consumed about 25% of eggs, 50% of english muffin, and 50% of greek yogurt.   Pt and family endorse a general decline in health since October 2024. Pt reports that she has been struggling with low sodium levels, which she has not seen any improvement with despite multiple MD visits (pt has been referred to nephrology recently, but has not attended appointment yet). Pt also shares that she has had "digestive issues" and underwent back surgery in January 202. Pt complains of indigestions and states that she feels like food "sits" below diaphragm. She also complains of consistent constipation and has used miralax and senokot with minimal effect. She was recently prescribed lactulose a few days PTA, so unsure if this has ben  effective. Pt and family concerned; relayed to concerns to MD and RN to see if anything else can be offered to her.   Pt shares that she follows the BRAT diet PTA, but is unsure what foods she can or can't tolerate. Discussed importance of good meal and supplement intake to promote healing. Pt amenable to supplements. Pt appreciative of diet liberalization.   Reviewed wt hx; pt has experienced a 18.2% wt loss over the past 4 months, which is significant for time frame. Pt reports her UBW is around 135# and estimates she she has lost about 25# over the past 4 months. Pt also complains of weakness and general decline in health due to multiple health issues.   Labs reviewed: Na: 122.    NUTRITION - FOCUSED PHYSICAL EXAM:  Flowsheet Row Most Recent Value  Orbital Region No depletion  Upper Arm Region Moderate depletion  Thoracic and Lumbar Region No depletion  Buccal Region Mild depletion  Temple Region No depletion  Clavicle Bone Region No depletion  Clavicle and Acromion Bone Region No depletion  Scapular Bone Region Mild depletion  Dorsal Hand Moderate depletion  Patellar Region Mild depletion  Anterior Thigh Region Mild depletion  Posterior Calf Region Mild depletion  Edema (RD Assessment) Mild  Hair Reviewed  Eyes Reviewed  Mouth Reviewed  Skin Reviewed  Nails Reviewed       Diet Order:   Diet Order             Diet regular Room service appropriate? Yes; Fluid consistency: Thin  Diet effective now  EDUCATION NEEDS:   Education needs have been addressed  Skin:  Skin Assessment: Skin Integrity Issues: Skin Integrity Issues:: Incisions Incisions: closed lt hip  Last BM:  07/10/23  Height:   Ht Readings from Last 1 Encounters:  07/08/23 5\' 1"  (1.549 m)    Weight:   Wt Readings from Last 1 Encounters:  07/08/23 59.4 kg    Ideal Body Weight:  47.7 kg  BMI:  Body mass index is 24.75 kg/m.  Estimated Nutritional Needs:   Kcal:   1750-1950  Protein:  90-105 grams  Fluid:  1.7-1.9 L    Herschel Lords, RD, LDN, CDCES Registered Dietitian III Certified Diabetes Care and Education Specialist If unable to reach this RD, please use "RD Inpatient" group chat on secure chat between hours of 8am-4 pm daily

## 2023-07-10 NOTE — Assessment & Plan Note (Signed)
-  Dietitian consult

## 2023-07-11 ENCOUNTER — Encounter: Payer: Self-pay | Admitting: Orthopedic Surgery

## 2023-07-11 DIAGNOSIS — D62 Acute posthemorrhagic anemia: Secondary | ICD-10-CM

## 2023-07-11 DIAGNOSIS — I160 Hypertensive urgency: Secondary | ICD-10-CM | POA: Diagnosis not present

## 2023-07-11 DIAGNOSIS — S72141A Displaced intertrochanteric fracture of right femur, initial encounter for closed fracture: Secondary | ICD-10-CM | POA: Diagnosis not present

## 2023-07-11 DIAGNOSIS — E871 Hypo-osmolality and hyponatremia: Secondary | ICD-10-CM | POA: Diagnosis not present

## 2023-07-11 LAB — CBC
HCT: 22.8 % — ABNORMAL LOW (ref 36.0–46.0)
Hemoglobin: 8.3 g/dL — ABNORMAL LOW (ref 12.0–15.0)
MCH: 33.2 pg (ref 26.0–34.0)
MCHC: 36.4 g/dL — ABNORMAL HIGH (ref 30.0–36.0)
MCV: 91.2 fL (ref 80.0–100.0)
Platelets: 124 10*3/uL — ABNORMAL LOW (ref 150–400)
RBC: 2.5 MIL/uL — ABNORMAL LOW (ref 3.87–5.11)
RDW: 12.7 % (ref 11.5–15.5)
WBC: 7.4 10*3/uL (ref 4.0–10.5)
nRBC: 0 % (ref 0.0–0.2)

## 2023-07-11 LAB — SODIUM
Sodium: 115 mmol/L — CL (ref 135–145)
Sodium: 116 mmol/L — CL (ref 135–145)
Sodium: 118 mmol/L — CL (ref 135–145)
Sodium: 119 mmol/L — CL (ref 135–145)

## 2023-07-11 LAB — RETICULOCYTES
Immature Retic Fract: 16.8 % — ABNORMAL HIGH (ref 2.3–15.9)
RBC.: 2.45 MIL/uL — ABNORMAL LOW (ref 3.87–5.11)
Retic Count, Absolute: 68.6 10*3/uL (ref 19.0–186.0)
Retic Ct Pct: 2.8 % (ref 0.4–3.1)

## 2023-07-11 LAB — GLUCOSE, CAPILLARY: Glucose-Capillary: 147 mg/dL — ABNORMAL HIGH (ref 70–99)

## 2023-07-11 LAB — OSMOLALITY: Osmolality: 246 mosm/kg — CL (ref 275–295)

## 2023-07-11 LAB — BASIC METABOLIC PANEL WITH GFR
Anion gap: 7 (ref 5–15)
BUN: 23 mg/dL (ref 8–23)
CO2: 24 mmol/L (ref 22–32)
Calcium: 8.3 mg/dL — ABNORMAL LOW (ref 8.9–10.3)
Chloride: 85 mmol/L — ABNORMAL LOW (ref 98–111)
Creatinine, Ser: 0.49 mg/dL (ref 0.44–1.00)
GFR, Estimated: >60 mL/min (ref >60)
Glucose, Bld: 125 mg/dL — ABNORMAL HIGH (ref 70–99)
Potassium: 4.6 mmol/L (ref 3.5–5.1)
Sodium: 116 mmol/L — CL (ref 135–145)

## 2023-07-11 LAB — IRON AND TIBC
Iron: 38 ug/dL (ref 28–170)
Saturation Ratios: 15 % (ref 10.4–31.8)
TIBC: 256 ug/dL (ref 250–450)
UIBC: 218 ug/dL

## 2023-07-11 LAB — SODIUM, URINE, RANDOM
Sodium, Ur: <10 mmol/L
Sodium, Ur: <10 mmol/L

## 2023-07-11 LAB — FOLATE: Folate: 24 ng/mL (ref >5.9)

## 2023-07-11 LAB — CORTISOL-AM, BLOOD: Cortisol - AM: 11.7 ug/dL (ref 6.7–22.6)

## 2023-07-11 LAB — OSMOLALITY, URINE
Osmolality, Ur: 313 mosm/kg (ref 300–900)
Osmolality, Ur: 196 mosm/kg — ABNORMAL LOW (ref 300–900)

## 2023-07-11 LAB — FERRITIN: Ferritin: 87 ng/mL (ref 11–307)

## 2023-07-11 MED ORDER — SODIUM CHLORIDE 0.9 % IV BOLUS
1000.0000 mL | Freq: Once | INTRAVENOUS | Status: AC
  Administered 2023-07-11: 1000 mL via INTRAVENOUS

## 2023-07-11 MED ORDER — FLEET ENEMA RE ENEM
1.0000 | ENEMA | Freq: Once | RECTAL | Status: AC
Start: 2023-07-11 — End: 2023-07-11
  Administered 2023-07-11: 1 via RECTAL

## 2023-07-11 MED ORDER — FE FUM-VIT C-VIT B12-FA 460-60-0.01-1 MG PO CAPS
1.0000 | ORAL_CAPSULE | Freq: Two times a day (BID) | ORAL | Status: DC
  Administered 2023-07-11 – 2023-07-16 (×9): 1 via ORAL
  Filled 2023-07-11 (×15): qty 1

## 2023-07-11 MED ORDER — CHLORHEXIDINE GLUCONATE CLOTH 2 % EX PADS
6.0000 | MEDICATED_PAD | Freq: Every day | CUTANEOUS | Status: DC
  Administered 2023-07-11 – 2023-07-16 (×6): 6 via TOPICAL

## 2023-07-11 MED ORDER — SODIUM CHLORIDE 3 % IV SOLN
INTRAVENOUS | Status: DC
  Filled 2023-07-11 (×7): qty 500

## 2023-07-11 NOTE — Consult Note (Signed)
 MEDICATION RELATED CONSULT NOTE  Pharmacy Consult for 3% NaCl Indication: Hyponatremia  Patient Measurements: Height: 5\' 1"  (154.9 cm) Weight: 59.4 kg (131 lb) IBW/kg (Calculated) : 47.8  Intake/Output from previous day: 04/16 0701 - 04/17 0700 In: 480 [P.O.:480] Out: -  Intake/Output from this shift: No intake/output data recorded.  Labs: Recent Labs    07/09/23 2213 07/10/23 0417 07/11/23 0536  WBC 11.3* 9.2 7.4  HGB 11.6* 10.3* 8.3*  HCT 31.5* 28.4* 22.8*  PLT 149* 154 124*  CREATININE 0.49 0.52 0.49  ALBUMIN  --  3.3*  --   PROT  --  5.3*  --   AST  --  30  --   ALT  --  24  --   ALKPHOS  --  44  --   BILITOT  --  0.6  --   BILIDIR  --  0.1  --   IBILI  --  0.5  --    Estimated Creatinine Clearance: 47.9 mL/min (by C-G formula based on SCr of 0.49 mg/dL).  Microbiology: No results found for this or any previous visit (from the past 720 hours).  Medical History: Past Medical History:  Diagnosis Date   Chickenpox    Edema    feet/legs   Hyperglycemia    Hyperlipidemia    Hypertension    Osteopenia    PONV (postoperative nausea and vomiting)    after hand surgery    Medications:  4/17 1312 3% NaCl at 30 cc/hr  Goal of Therapy:  Increase Na by 8 - 12 mmol in 24h  Plan:  --Pharmacy to assist in monitoring sodium per protocol while on hypertonic saline --Will notify provider if sodium rises > 4 mmol/L over 2 hours or > 6 mmol/L over 4 hours  Thomasine Flick PharmD Clinical Pharmacist 07/11/2023

## 2023-07-11 NOTE — Consult Note (Signed)
 MEDICATION RELATED CONSULT NOTE  Pharmacy Consult for 3% NaCl Indication: Hyponatremia  Patient Measurements: Height: 5\' 1"  (154.9 cm) Weight: 59.4 kg (131 lb) IBW/kg (Calculated) : 47.8  Intake/Output from previous day: 04/16 0701 - 04/17 0700 In: 480 [P.O.:480] Out: -  Intake/Output from this shift: No intake/output data recorded.  Labs: Recent Labs    07/09/23 2213 07/10/23 0417 07/11/23 0536  WBC 11.3* 9.2 7.4  HGB 11.6* 10.3* 8.3*  HCT 31.5* 28.4* 22.8*  PLT 149* 154 124*  CREATININE 0.49 0.52 0.49  ALBUMIN  --  3.3*  --   PROT  --  5.3*  --   AST  --  30  --   ALT  --  24  --   ALKPHOS  --  44  --   BILITOT  --  0.6  --   BILIDIR  --  0.1  --   IBILI  --  0.5  --    Estimated Creatinine Clearance: 47.9 mL/min (by C-G formula based on SCr of 0.49 mg/dL).  Microbiology: No results found for this or any previous visit (from the past 720 hours).  Medical History: Past Medical History:  Diagnosis Date   Chickenpox    Edema    feet/legs   Hyperglycemia    Hyperlipidemia    Hypertension    Osteopenia    PONV (postoperative nausea and vomiting)    after hand surgery    Medications:  4/17 1312 3% NaCl at 30 cc/hr  Goal of Therapy:  Increase Na by 8 - 12 mmol in 24h  Plan:  --Pharmacy to assist in monitoring sodium per protocol while on hypertonic saline --Will notify provider if sodium rises > 4 mmol/L over 2 hours or > 6 mmol/L over 4 hours  Page Boast 07/11/2023,1:15 PM

## 2023-07-11 NOTE — Progress Notes (Signed)
 Progress Note   Patient: Shelly Armstrong WJX:[id number] DOB: 12/10/1945 DOA: 07/08/2023     2 DOS: the patient was seen and examined on 07/11/2023   Brief hospital course: Taken from prior notes.  Shelly Armstrong is a 78 y.o. female with medical history significant for HTN, HLD admitted with displaced right intertrochanteric femur fracture following mechanical fall.  Orthopedic surgery was consulted and patient was taken to the OR on/15/25  4/16: S/p ORIF.  Labs with sodium of 122, chloride 92, leukocytosis resolved, hemoglobin of 10.3 decreased from 11.7 on admission likely due to intraoperative blood loss.  Starting on supplement. Hyponatremia labs ordered.  Hyponatremia seems chronic patient was on HCTZ at home which was discontinued.  Slight decrease in sodium after getting normal saline.  A.m. cortisol low at 4, serum osmolality low at 253, pending urine studies.  Will repeat a.m. cortisol tomorrow and also starting on salt tablet.  4/17: Worsening sodium, came back 116 with a.m. lab, given 1 L bolus of normal saline by night on-call provider-repeat sodium even lower at 115 Patient was feeling more lethargic and weak-transferring to stepdown, nephrology consult and starting on hypertonic saline. Hemoglobin with another significant drop to 8.3, no obvious bleeding but obviously patient recently had a hip fracture and surgery. Repeat a.m. cortisol levels pending  Assessment and Plan: * Closed intertrochanteric fracture of hip, right, initial encounter (HCC) Accidental fall Preoperative clearance was obtained S/p ORIF-tolerated the procedure well.  She will be weightbearing as tolerated per orthopedic surgery PT/OT are recommending SNF -Continue with supportive care and pain management  Hypertensive urgency Blood pressure now within goal.  It was significantly elevated on admission likely due to pain. -Continue home irbesartan -Home HCTZ was discontinued due to  hyponatremia  Chronic hyponatremia Worsening sodium at 115 now Seems chronic and slight worsening with normal saline.  Patient was also on HCTZ at home which is likely contributory.  Hyponatremia labs with slightly low serum osmolality, urine studies still pending.  TSH normal, a.m. cortisol low at 4.0 Suspect SIADH -Transfer to stepdown -Nephrology consult -Starting on hypertonic saline - Continue salt tablet - Repeat a.m. cortisol levels pending -Monitor sodium  Acute postoperative anemia due to expected blood loss Hemoglobin decreased to 8.3, patient had his recent hip surgery and a fracture.  No other obvious bleeding at this time. -Started on supplement -Continue to monitor  Malnutrition of moderate degree Dietitian consult      Subjective: Patient was feeling more weak and lethargic when seen today.  Also appears pale.  Appetite poor.  Physical Exam: Vitals:   07/10/23 2059 07/11/23 0321 07/11/23 0749 07/11/23 1300  BP: (!) 114/53 123/62 (!) 153/71 (!) 146/54  Pulse: 73 77 77 73  Resp: 18 18 18 15   Temp: 97.7 F (36.5 C) 97.7 F (36.5 C) 97.6 F (36.4 C) 97.7 F (36.5 C)  TempSrc:  Oral Oral Oral  SpO2: 99% 97% 96% 97%  Weight:      Height:       General.  Ill-appearing, frail elderly lady, in no acute distress. Pulmonary.  Lungs clear bilaterally, normal respiratory effort. CV.  Regular rate and rhythm, no JVD, rub or murmur. Abdomen.  Soft, nontender, nondistended, BS positive. CNS.  Alert and oriented .  No focal neurologic deficit. Extremities.  No edema, no cyanosis, pulses intact and symmetrical. Psychiatry.  Judgment and insight appears normal.    Data Reviewed: Prior data reviewed  Family Communication: Discussed with sister and daughter at bedside  Disposition: Status is: Inpatient Remains inpatient appropriate because: Severity of illness  Planned Discharge Destination: Skilled nursing facility  DVT prophylaxis.  Lovenox Time spent: 50  minutes  This record has been created using Conservation officer, historic buildings. Errors have been sought and corrected,but may not always be located. Such creation errors do not reflect on the standard of care.   Author: Luna Salinas, MD 07/11/2023 1:28 PM  For on call review [email or web address].

## 2023-07-11 NOTE — Progress Notes (Signed)
 PT Cancellation Note  Patient Details Name: Shelly Armstrong MRN: [id number] DOB: 1945/11/28   Cancelled Treatment:     PT attempt. Pt already received IV bolus for low sodium. Upon arrival to her room, pt holding emesis bag and endorsing severe constipation. " I've been dealing with constipation for 3 months." MD made aware. Pt unwilling to attempt mobility OOB activity due to severity of stomach pain. Acute PT will return later this date and continue to follow per current POC.    Koleen Perna 07/11/2023, 9:10 AM

## 2023-07-11 NOTE — Consult Note (Signed)
 [facility] Consult Note: 07/11/23     Date of Admission:  07/08/2023           Reason for Consult:  Hyponatremia   Referring Provider: Arnetha Courser, MD Primary Care Provider: Lauro Regulus, MD   History of Presenting Illness:  Shelly Armstrong is a 78 y.o. female with medical problems of hypertension, hyperlipidemia presented for low back pain, chronic constipation.  She had an accidental fall which resulted in right intertrochanteric fracture.  She underwent right hip intramedullary nail placement on 07/09/2023. Today morning, her sodium was noted to be critically low at 116.  Repeat level was 115.Patient has history of chronic hyponatremia.  Her sodium level usually runs between 126 231 as noted in December 2024. Nephrology consult has now been requested for further evaluation. Patient is very somnolent at the moment because she got morphine for pain.  She is not able to participate in H&P and not able to provide any meaningful information.  All information is obtained from the chart.   Review of Systems: ROS-not available due to patient being very somnolent.  Past Medical History:  Diagnosis Date   Chickenpox    Edema    feet/legs   Hyperglycemia    Hyperlipidemia    Hypertension    Osteopenia    PONV (postoperative nausea and vomiting)    after hand surgery    Social History   Tobacco Use   Smoking status: Never   Smokeless tobacco: Never  Vaping Use   Vaping status: Never Used  Substance Use Topics   Alcohol use: No   Drug use: No    Family History  Problem Relation Age of Onset   Heart failure Mother    CAD Father    Breast cancer Neg Hx      OBJECTIVE: Blood pressure (!) 146/54, pulse 73, temperature 97.7 F (36.5 C), temperature source Oral, resp. rate 15, height 5\' 1"  (1.549 m), weight 59.4 kg, SpO2 97%.  Physical Exam General Appearance-no acute distress, laying in the bed HEENT-moist oral mucous  membranes Pulmonary-normal breathing effort on room air, diminished breath sounds at bases Cardiac-regular rhythm Abdomen-soft, nontender, nondistended Extremities-no peripheral edema Neuro-somnolent  Lab Results Lab Results  Component Value Date   WBC 7.4 07/11/2023   HGB 8.3 (L) 07/11/2023   HCT 22.8 (L) 07/11/2023   MCV 91.2 07/11/2023   PLT 124 (L) 07/11/2023    Lab Results  Component Value Date   CREATININE 0.49 07/11/2023   BUN 23 07/11/2023   NA 115 (LL) 07/11/2023   K 4.6 07/11/2023   CL 85 (L) 07/11/2023   CO2 24 07/11/2023    Lab Results  Component Value Date   ALT 24 07/10/2023   AST 30 07/10/2023   ALKPHOS 44 07/10/2023   BILITOT 0.6 07/10/2023     Microbiology: No results found for this or any previous visit (from the past 240 hours).  Medications: Scheduled Meds:  aspirin EC  81 mg Oral Daily   atorvastatin  40 mg Oral Daily   Chlorhexidine Gluconate Cloth  6 each Topical Daily   docusate sodium  100 mg Oral BID   enoxaparin (LOVENOX) injection  40 mg Subcutaneous Q24H   Fe Fum-Vit C-Vit B12-FA  1 capsule Oral BID   feeding supplement  237 mL Oral BID BM   irbesartan  300 mg Oral Daily   multivitamin with minerals  1 tablet Oral Daily   pantoprazole  40 mg Oral Daily  polyethylene glycol  17 g Oral Daily   senna  2 tablet Oral Daily   sodium chloride  1 g Oral BID WC   Continuous Infusions:  sodium chloride (hypertonic) 30 mL/hr at 07/11/23 1312   PRN Meds:.acetaminophen, hydrALAZINE, HYDROcodone-acetaminophen, menthol-cetylpyridinium **OR** phenol, methocarbamol **OR** methocarbamol (ROBAXIN) injection, metoCLOPramide **OR** metoCLOPramide (REGLAN) injection, morphine injection, ondansetron (ZOFRAN) IV  Allergies  Allergen Reactions   Sulfamethoxazole-Trimethoprim Rash and Other (See Comments)    Urinalysis: No results for input(s): "COLORURINE", "LABSPEC", "PHURINE", "GLUCOSEU", "HGBUR", "BILIRUBINUR", "KETONESUR", "PROTEINUR",  "UROBILINOGEN", "NITRITE", "LEUKOCYTESUR" in the last 72 hours.  Invalid input(s): "APPERANCEUR"    Imaging: DG HIP UNILAT WITH PELVIS 2-3 VIEWS RIGHT Result Date: 07/09/2023 CLINICAL DATA:  Elective surgery. EXAM: DG HIP (WITH OR WITHOUT PELVIS) 2-3V RIGHT COMPARISON:  Preoperative imaging FINDINGS: Four fluoroscopic spot views of the right hip submitted from the operating room. Femoral intramedullary nail with trans trochanteric and distal locking screw fixation traverse proximal femur fracture. Fluoroscopy time 3 minutes. Dose 32.52 mGy. IMPRESSION: Intraoperative fluoroscopy during right femur fracture fixation. Electronically Signed   By: Chadwick Colonel M.D.   On: 07/09/2023 16:54   DG C-Arm 1-60 Min-No Report Result Date: 07/09/2023 Fluoroscopy was utilized by the requesting physician.  No radiographic interpretation.   DG C-Arm 1-60 Min-No Report Result Date: 07/09/2023 Fluoroscopy was utilized by the requesting physician.  No radiographic interpretation.      Assessment/Plan:  Shelly Armstrong is a 78 y.o. female with medical problems of  hypertension, hyperlipidemia presented for low back pain, chronic constipation.  She had an accidental fall which resulted in right intertrochanteric fracture.  She was admitted on 07/08/2023 for :  Hip fracture (HCC) [S72.009A] Hyponatremia [E87.1] Right hip pain [M25.551] Fall, initial encounter [W19.XXXA] Closed right hip fracture, initial encounter (HCC) [S72.001A] Pain in right femur [M89.8X5]  1.  Acute on chronic hyponatremia Patient appears to be euvolemic.  Serum osmolality at 253.Normal TSH 07/09/2023. A.m. cortisol was low on 416 but improved on 07/11/2023. Portable chest x-ray from 07/08/2023 shows normal heart size, lungs are clear.  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 We will initiate 3% hypertonic solution for management of acute hyponatremia.  Goal of correction is about 8 mEq in 24 hours. Pharmacy consult to assist with  monitoring and dosing of 3% saline. - We will obtain SPEP and kappa lambda ratio.  2.  Right intertrochanteric fracture She underwent right hip intramedullary nail placement on 07/09/2023.     Management as per orthopedic and primary team.   Rica Chalet 07/11/23

## 2023-07-11 NOTE — Assessment & Plan Note (Addendum)
 Acute on chronic hyponatremia. Seems chronic and slight worsening with normal saline.  Patient was also on HCTZ at home which is likely contributory.  Hyponatremia labs with slightly low serum and urine osmolality, urine sodium less than 10 TSH normal, a.m. repeat cortisol at 11.  Suspect SIADH Slowly improving with hypertonic saline-goal is 8 and 24-hour Nephrology is on board -Continue with hypertonic saline - Continue salt tablet -Monitor sodium

## 2023-07-11 NOTE — Progress Notes (Signed)
 Lab called critical of Na 116; notified MD; pt alert/ sleeping at the time, will continue to monitor

## 2023-07-11 NOTE — Progress Notes (Signed)
  Subjective: 2 Days Post-Op Procedure(s) (LRB): FIXATION, FRACTURE, INTERTROCHANTERIC, WITH INTRAMEDULLARY ROD (Right) Patient reports pain as severe in the left hip this AM..   Patient is  reports she is not feeling well, Nauseous. PT and care management to assist with discharge planning. Negative for chest pain and shortness of breath Fever: no Gastrointestinal:Positive for nausea and vomiting  Objective: Vital signs in last 24 hours: Temp:  [97.7 F (36.5 C)-98 F (36.7 C)] 97.7 F (36.5 C) (04/17 0321) Pulse Rate:  [69-77] 77 (04/17 0321) Resp:  [16-18] 18 (04/17 0321) BP: (113-127)/(53-62) 123/62 (04/17 0321) SpO2:  [96 %-99 %] 97 % (04/17 0321)  Intake/Output from previous day:  Intake/Output Summary (Last 24 hours) at 07/11/2023 0718 Last data filed at 07/10/2023 1900 Gross per 24 hour  Intake 480 ml  Output --  Net 480 ml    Intake/Output this shift: No intake/output data recorded.  Labs: Recent Labs    07/08/23 2348 07/09/23 2213 07/10/23 0417 07/11/23 0536  HGB 11.7* 11.6* 10.3* 8.3*   Recent Labs    07/10/23 0417 07/11/23 0536  WBC 9.2 7.4  RBC 3.09* 2.50*  HCT 28.4* 22.8*  PLT 154 124*   Recent Labs    07/10/23 0417 07/11/23 0536  NA 122* 116*  K 3.9 4.6  CL 92* 85*  CO2 23 24  BUN 16 23  CREATININE 0.52 0.49  GLUCOSE 154* 125*  CALCIUM 8.6* 8.3*   No results for input(s): "LABPT", "INR" in the last 72 hours.   EXAM General - Patient is Alert, Appropriate, and Oriented Extremity - Neurovascular intact Incision: dressing C/D/I No cellulitis present Compartment soft Dressing/Incision - clean, dry, no drainage, honeycomb dressing intact to the right hip. Motor Function - intact, moving foot and toes well on exam.  Abdomen soft with intact bowel sounds this AM.  Past Medical History:  Diagnosis Date   Chickenpox    Edema    feet/legs   Hyperglycemia    Hyperlipidemia    Hypertension    Osteopenia    PONV (postoperative  nausea and vomiting)    after hand surgery    Assessment/Plan: 2 Days Post-Op Procedure(s) (LRB): FIXATION, FRACTURE, INTERTROCHANTERIC, WITH INTRAMEDULLARY ROD (Right) Principal Problem:   Closed intertrochanteric fracture of hip, right, initial encounter (HCC) Active Problems:   Chronic hyponatremia   Hypertensive urgency   Malnutrition of moderate degree  Estimated body mass index is 24.75 kg/m as calculated from the following:   Height as of this encounter: 5\' 1"  (1.549 m).   Weight as of this encounter: 59.4 kg.  Labs and vitals reviewed. Na 116, being managed by internal medicine. Hg 8.3, continue to monitor for possible transfusion. N/V, Reglan and Zofran ordered.  FLEET enema given. Monitor constipation with her taking narcotics. Reports moderate-severe pain in the right hip.  Continue with Norco. Continue with PT as tolerated, will likely need SNF.  DVT Prophylaxis - Lovenox and TED hose Weight-Bearing as tolerated to right leg  J. Edie Goon, PA-C [facility] 07/11/2023, 7:18 AM

## 2023-07-11 NOTE — TOC CM/SW Note (Signed)
 Met with patient and family at bedside. Spoke with Carmelina Chinchilla, sister, and Loetta Ringer, daughter, discussed discharge planning. They are all agreeable for SNF placement at discharge for additional rehab.

## 2023-07-11 NOTE — Progress Notes (Signed)
 Physical Therapy Treatment Patient Details Name: Shelly Armstrong MRN: [id number] DOB: Jul 19, 1945 Today's Date: 07/11/2023   History of Present Illness presented to ER status post mechanical fall in home environment, acute onset of R hip pain; admitted for management of closed intertrochanteric hip fracture, s/p IM nailing (07/09/23), WBAT.    PT Comments  Author was contacted by RN with request to assist pt to BR for enema. Upon arrival, pt was long sitting in bed. Continues to require extensive time and assistance to safely exit bed. Min assist to stand from elevated bed height to RW prior to ambulating to BR with extremely slow antalgic step to gait pattern. Pt required extensive assistance to stand > sit on toilet. Author will return for BID session later this date. Acute PT will continue to follow per current POC.    If plan is discharge home, recommend the following: A lot of help with walking and/or transfers;A lot of help with bathing/dressing/bathroom     Equipment Recommendations  Other (comment) (defer to next level of care)       Precautions / Restrictions Precautions Precautions: Fall Restrictions Weight Bearing Restrictions Per Provider Order: Yes RLE Weight Bearing Per Provider Order: Weight bearing as tolerated     Mobility  Bed Mobility Overal bed mobility: Needs Assistance Bed Mobility: Supine to Sit  Supine to sit: Mod assist  General bed mobility comments: Pt was long sittting in bed upon arrival. Required increased time to exit bed. Vcs for technique and sequencing.    Transfers Overall transfer level: Needs assistance Equipment used: Rolling walker (2 wheels) Transfers: Sit to/from Stand Sit to Stand: Mod assist, Min assist  General transfer comment: min assist from elevated ebd height. mod assist to sit to lower toilet surface    Ambulation/Gait Ambulation/Gait assistance: Min assist Gait Distance (Feet): 20 Feet Assistive device: Rolling walker (2  wheels) Gait Pattern/deviations: Step-to pattern Gait velocity: decreased  General Gait Details: Pt was able to ambulate to BR. RN in room but +2 for safety only. min assist of 1. Extremely slow cadance.   Balance Overall balance assessment: Needs assistance Sitting-balance support: No upper extremity supported, Feet supported Sitting balance-Leahy Scale: Fair     Standing balance support: Bilateral upper extremity supported Standing balance-Leahy Scale: Fair     Hotel manager: No apparent difficulties  Cognition Arousal: Alert Behavior During Therapy: WFL for tasks assessed/performed   PT - Cognitive impairments: No apparent impairments   PT - Cognition Comments: Pt is A and O but needs encouragement to participate Following commands: Intact      Cueing Cueing Techniques: Verbal cues         Pertinent Vitals/Pain Pain Assessment Pain Assessment: 0-10 Pain Score: 9  Pain Location: R hip Pain Descriptors / Indicators: Aching, Grimacing, Guarding Pain Intervention(s): Limited activity within patient's tolerance, Monitored during session, Premedicated before session, Repositioned     PT Goals (current goals can now be found in the care plan section) Acute Rehab PT Goals Patient Stated Goal: to return home Progress towards PT goals: Progressing toward goals    Frequency    BID       AM-PAC PT "6 Clicks" Mobility   Outcome Measure  Help needed turning from your back to your side while in a flat bed without using bedrails?: A Little Help needed moving from lying on your back to sitting on the side of a flat bed without using bedrails?: A Lot Help needed moving to and from a bed  to a chair (including a wheelchair)?: A Lot Help needed standing up from a chair using your arms (e.g., wheelchair or bedside chair)?: A Lot Help needed to walk in hospital room?: A Little Help needed climbing 3-5 steps with a railing? : A Lot 6 Click Score:  14    End of Session   Activity Tolerance: Patient tolerated treatment well Patient left: Other (comment) (in BR on toilet with RN in BR with her) Nurse Communication: Mobility status PT Visit Diagnosis: Muscle weakness (generalized) (M62.81);Difficulty in walking, not elsewhere classified (R26.2);Pain Pain - Right/Left: Right Pain - part of body: Hip     Time: 5284-1324 PT Time Calculation (min) (ACUTE ONLY): 13 min  Charges:    $Gait Training: 8-22 mins PT General Charges $$ ACUTE PT VISIT: 1 Visit                     Chester Costa PTA 07/11/23, 11:55 AM

## 2023-07-11 NOTE — Progress Notes (Signed)
 OT Cancellation Note  Patient Details Name: Shelly Armstrong MRN: [id number] DOB: 1945/05/07   Cancelled Treatment:    Reason Eval/Treat Not Completed: Medical issues which prohibited therapy. Consult received, chart reviewed. Pt with N/V this am, declining therapy. Will re-attempt at later date/time as medically appropriate.   Clancy Mullarkey R., MPH, MS, OTR/L ascom [phone] 07/11/23, 9:11 AM

## 2023-07-11 NOTE — Consult Note (Addendum)
 MEDICATION RELATED CONSULT NOTE  Pharmacy Consult for 3% NaCl Indication: Hyponatremia  Patient Measurements: Height: 5\' 1"  (154.9 cm) Weight: 59.4 kg (131 lb) IBW/kg (Calculated) : 47.8  Intake/Output from previous day: 04/16 0701 - 04/17 0700 In: 480 [P.O.:480] Out: -  Intake/Output from this shift: No intake/output data recorded.  Labs: Recent Labs    07/09/23 2213 07/10/23 0417 07/11/23 0536  WBC 11.3* 9.2 7.4  HGB 11.6* 10.3* 8.3*  HCT 31.5* 28.4* 22.8*  PLT 149* 154 124*  CREATININE 0.49 0.52 0.49  ALBUMIN  --  3.3*  --   PROT  --  5.3*  --   AST  --  30  --   ALT  --  24  --   ALKPHOS  --  44  --   BILITOT  --  0.6  --   BILIDIR  --  0.1  --   IBILI  --  0.5  --    Estimated Creatinine Clearance: 47.9 mL/min (by C-G formula based on SCr of 0.49 mg/dL).  Microbiology: No results found for this or any previous visit (from the past 720 hours).  Medical History: Past Medical History:  Diagnosis Date   Chickenpox    Edema    feet/legs   Hyperglycemia    Hyperlipidemia    Hypertension    Osteopenia    PONV (postoperative nausea and vomiting)    after hand surgery   Medications:  4/17 1312 3% NaCl at 30 cc/hr  Na Levels: 4/17 1036 = 115 Hypertonic saline started  4/17 1454 = 116  4/17 1653 = 118  4/17 2134 = 119  Goal of Therapy:  Increase Na by 8 - 12 mmol in 24h  Plan:  --Continue current 3%NaCl rate of 30 cc/hr --Pharmacy to assist in monitoring sodium per protocol while on hypertonic saline --Will notify provider if sodium rises > 4 mmol/L over 2 hours or > 6 mmol/L over 4 hours  Hava Massingale A Ijanae Macapagal, PharmD Clinical Pharmacist 07/11/2023 10:11 PM

## 2023-07-11 NOTE — Assessment & Plan Note (Addendum)
 Hemoglobin decreased to 8.3, patient had his recent hip surgery and a fracture.  No other obvious bleeding at this time. No obvious dietary deficiency on anemia panel. -Started on supplement -Continue to monitor

## 2023-07-11 NOTE — Plan of Care (Addendum)
       CROSS COVER NOTE  NAME: Shelly Armstrong MRN: [id number] DOB : November 25, 1945    Concern as stated by nurse / staff   lab called critical of sodium 116 this morning, pt Na been low since adm. hx of chronic hyponatremia, na keeps going low.      Pertinent findings on chart review:    Latest Ref Rng & Units 07/11/2023    5:36 AM 07/10/2023    4:17 AM 07/09/2023   10:13 PM  BMP  Glucose 70 - 99 mg/dL 409  811  914   BUN 8 - 23 mg/dL 23  16  14    Creatinine 0.44 - 1.00 mg/dL 7.82  9.56  2.13   Sodium 135 - 145 mmol/L 116  122  123   Potassium 3.5 - 5.1 mmol/L 4.6  3.9  3.8   Chloride 98 - 111 mmol/L 85  92  92   CO2 22 - 32 mmol/L 24  23  24    Calcium 8.9 - 10.3 mg/dL 8.3  8.6  8.6    Per last progress note: Chronic hyponatremia Seems chronic and slight worsening with normal saline.  Patient was also on HCTZ at home which is likely contributory.  Hyponatremia labs with slightly low serum osmolality, urine studies still pending.  TSH normal, a.m. cortisol low at 4.0 Suspect SIADH -Adding salt tablet -Will repeat a.m. cortisol    Assessment and  Interventions   Assessment:  Acute on chronic hyponatremia, worsening in spite of salt tablets  Plan: NS bolus Recheck sodium  after bolus  Consider nephrology consult for consideration of 3% saline

## 2023-07-11 NOTE — Evaluation (Signed)
 Occupational Therapy Evaluation Patient Details Name: Shelly Armstrong MRN: [id number] DOB: June 09, 1945 Today's Date: 07/11/2023   History of Present Illness   presented to ER status post mechanical fall in home environment, acute onset of R hip pain; admitted for management of closed intertrochanteric hip fracture, s/p IM nailing (07/09/23), WBAT.     Clinical Impressions Pt seen for OT evaluation this date, POD#2 from above surgery. Pt is eager to return to PLOF with less pain and improved safety and independence. Pt currently requires MAX A for LB dressing, dressing, and clothing mgt/pericare, +1-2 assist for ADL transfers 2/2 R hip pain, nauseated. Pt declined use of bed pan and preferred to get to [facility] for urination. Pt instructed in self care skills and RW use for transferring to [facility] with noted improvement in transfer technique when standing from [facility] versus initial STS from [facility]. Pt would benefit from additional instruction in self care skills and techniques to help maintain precautions with or without assistive devices to support recall and carryover prior to discharge.  Note: After session was completed, OT made aware pt was going to transfer to the ICU. Will complete current order and require new OT consult to continue OT services, per protocol.     If plan is discharge home, recommend the following:   A lot of help with walking and/or transfers;A lot of help with bathing/dressing/bathroom;Assistance with cooking/housework;Assist for transportation;Help with stairs or ramp for entrance     Functional Status Assessment   Patient has had a recent decline in their functional status and demonstrates the ability to make significant improvements in function in a reasonable and predictable amount of time.     Equipment Recommendations   Other (comment) (defer)     Recommendations for Other Services         Precautions/Restrictions   Precautions Precautions: Fall Recall of  Precautions/Restrictions: Impaired Restrictions Weight Bearing Restrictions Per Provider Order: Yes RLE Weight Bearing Per Provider Order: Weight bearing as tolerated     Mobility Bed Mobility Overal bed mobility: Needs Assistance Bed Mobility: Supine to Sit, Sit to Supine     Supine to sit: Min assist, Mod assist, +2 for physical assistance, HOB elevated Sit to supine: Mod assist, +2 for physical assistance   General bed mobility comments: pain limited, +2 to assist with need to get to [facility]    Transfers Overall transfer level: Needs assistance Equipment used: Rolling walker (2 wheels), None Transfers: Sit to/from Stand, Bed to chair/wheelchair/BSC Sit to Stand: Min assist, Mod assist, +2 physical assistance     Step pivot transfers: Mod assist, +2 safety/equipment     General transfer comment: VC for hand placement, VC for WBing through RLE      Balance Overall balance assessment: Needs assistance Sitting-balance support: No upper extremity supported, Feet supported Sitting balance-Leahy Scale: Fair     Standing balance support: Bilateral upper extremity supported, Reliant on assistive device for balance Standing balance-Leahy Scale: Poor                             ADL either performed or assessed with clinical judgement   ADL Overall ADL's : Needs assistance/impaired                     Lower Body Dressing: Sitting/lateral leans;Sit to/from stand;Maximal assistance   Toilet Transfer: +2 for physical assistance;+2 for safety/equipment;Moderate assistance;Minimal assistance Toilet Transfer Details (indicate cue type and reason): step  pivot VC for sequencing Toileting- Clothing Manipulation and Hygiene: Sit to/from stand;+2 for safety/equipment;Maximal assistance               Vision         Perception         Praxis         Pertinent Vitals/Pain Pain Assessment Pain Assessment: 0-10 Pain Score: 10-Worst pain ever Pain  Location: R hip Pain Descriptors / Indicators: Aching, Grimacing, Guarding Pain Intervention(s): Limited activity within patient's tolerance, Monitored during session, Premedicated before session, Repositioned, Patient requesting pain meds-RN notified     Extremity/Trunk Assessment Upper Extremity Assessment Upper Extremity Assessment: Overall WFL for tasks assessed   Lower Extremity Assessment Lower Extremity Assessment: Defer to PT evaluation;RLE deficits/detail RLE Deficits / Details: pain limited weightbearing       Communication Communication Communication: No apparent difficulties   Cognition Arousal: Alert Behavior During Therapy: WFL for tasks assessed/performed Cognition: No apparent impairments                               Following commands: Intact       Cueing  General Comments   Cueing Techniques: Verbal cues  Pt nauseated, but wanted to get to the [facility]   Exercises     Shoulder Instructions      Home Living Family/patient expects to be discharged to:: Private residence Living Arrangements: Spouse/significant other Available Help at Discharge: Family Type of Home: House Home Access: Stairs to enter Secretary/administrator of Steps: 3 Entrance Stairs-Rails: None Home Layout: One level                          Prior Functioning/Environment Prior Level of Function : Independent/Modified Independent             Mobility Comments: Indep with ADLs, household and community mobilization; + driving, denies additional fall history outside of this admission.  Husband with multiple medical issues; patient largely responsible for majority of household responsibilites and community errands.      OT Problem List: Decreased strength;Pain;Decreased activity tolerance;Impaired balance (sitting and/or standing);Decreased knowledge of use of DME or AE   OT Treatment/Interventions: Self-care/ADL training;Therapeutic exercise;Therapeutic  activities;DME and/or AE instruction;Patient/family education;Balance training      OT Goals(Current goals can be found in the care plan section)   Acute Rehab OT Goals Patient Stated Goal: feel better OT Goal Formulation: With patient Time For Goal Achievement: 07/25/23 Potential to Achieve Goals: Good ADL Goals Pt Will Perform Upper Body Dressing: sitting;with modified independence Pt Will Perform Lower Body Dressing: sitting/lateral leans;sit to/from stand;with supervision Pt Will Transfer to Toilet: with supervision;ambulating (LRAD) Pt Will Perform Toileting - Clothing Manipulation and hygiene: with modified independence Additional ADL Goal #1: Pt will verbalize plan to implement at least 1 learned falls prevention strategy.   OT Frequency:  Min 2X/week    Co-evaluation              AM-PAC OT "6 Clicks" Daily Activity     Outcome Measure Help from another person eating meals?: None Help from another person taking care of personal grooming?: A Little Help from another person toileting, which includes using toliet, bedpan, or urinal?: A Lot Help from another person bathing (including washing, rinsing, drying)?: A Lot Help from another person to put on and taking off regular upper body clothing?: A Little Help from another person to put on and  taking off regular lower body clothing?: A Lot 6 Click Score: 16   End of Session Equipment Utilized During Treatment: Rolling walker (2 wheels)  Activity Tolerance: Patient limited by pain;Patient tolerated treatment well;Treatment limited secondary to medical complications (Comment) (nausea) Patient left: in bed;with call bell/phone within reach;with bed alarm set  OT Visit Diagnosis: Other abnormalities of gait and mobility (R26.89);Pain Pain - Right/Left: Right Pain - part of body: Hip                Time: 1144-1200 OT Time Calculation (min): 16 min Charges:  OT General Charges $OT Visit: 1 Visit OT Evaluation $OT Eval  Moderate Complexity: 1 Mod  Berenda Breaker., MPH, MS, OTR/L ascom [phone] 07/11/23, 2:19 PM

## 2023-07-11 NOTE — Plan of Care (Signed)

## 2023-07-12 DIAGNOSIS — S72141A Displaced intertrochanteric fracture of right femur, initial encounter for closed fracture: Secondary | ICD-10-CM | POA: Diagnosis not present

## 2023-07-12 DIAGNOSIS — I160 Hypertensive urgency: Secondary | ICD-10-CM | POA: Diagnosis not present

## 2023-07-12 DIAGNOSIS — D62 Acute posthemorrhagic anemia: Secondary | ICD-10-CM | POA: Diagnosis not present

## 2023-07-12 DIAGNOSIS — E871 Hypo-osmolality and hyponatremia: Secondary | ICD-10-CM | POA: Diagnosis not present

## 2023-07-12 LAB — BASIC METABOLIC PANEL WITH GFR
Anion gap: 5 (ref 5–15)
BUN: 14 mg/dL (ref 8–23)
CO2: 22 mmol/L (ref 22–32)
Calcium: 8 mg/dL — ABNORMAL LOW (ref 8.9–10.3)
Chloride: 92 mmol/L — ABNORMAL LOW (ref 98–111)
Creatinine, Ser: 0.57 mg/dL (ref 0.44–1.00)
GFR, Estimated: >60 mL/min (ref >60)
Glucose, Bld: 104 mg/dL — ABNORMAL HIGH (ref 70–99)
Potassium: 4.6 mmol/L (ref 3.5–5.1)
Sodium: 119 mmol/L — CL (ref 135–145)

## 2023-07-12 LAB — SODIUM
Sodium: 120 mmol/L — ABNORMAL LOW (ref 135–145)
Sodium: 121 mmol/L — ABNORMAL LOW (ref 135–145)
Sodium: 121 mmol/L — ABNORMAL LOW (ref 135–145)
Sodium: 125 mmol/L — ABNORMAL LOW (ref 135–145)
Sodium: 126 mmol/L — ABNORMAL LOW (ref 135–145)

## 2023-07-12 LAB — VITAMIN B12: Vitamin B-12: 1268 pg/mL — ABNORMAL HIGH (ref 180–914)

## 2023-07-12 NOTE — Progress Notes (Signed)
 [facility]  ROUNDING NOTE   Subjective:   Patient seen sitting  up in bed Family at bedside Denies dizziness Tolerating small meals  Sodium 119  Objective:  Vital signs in last 24 hours:  Temp:  [97.6 F (36.4 C)-98.3 F (36.8 C)] 97.6 F (36.4 C) (04/18 1000) Pulse Rate:  [73-95] 91 (04/18 1200) Resp:  [4-23] 14 (04/18 1200) BP: (95-155)/(40-87) 134/53 (04/18 1200) SpO2:  [93 %-100 %] 99 % (04/18 1200)  Weight change:  Filed Weights   07/08/23 2149  Weight: 59.4 kg    Intake/Output: I/O last 3 completed shifts: In: 413 [P.O.:240; I.V.:173] Out: 1075 [Urine:1075]   Intake/Output this shift:  Total I/O In: 240 [P.O.:240] Out: -   Physical Exam: General: NAD  Head: Normocephalic, atraumatic. Moist oral mucosal membranes  Eyes: Anicteric  Lungs:  Clear to auscultation  Heart: Regular rate and rhythm  Abdomen:  Soft, nontender,   Extremities:  No peripheral edema.  Neurologic: Alert, moving all four extremities  Skin: No lesions       Basic Metabolic Panel: Recent Labs  Lab 07/08/23 2348 07/09/23 2213 07/10/23 0417 07/11/23 0536 07/11/23 1036 07/11/23 1653 07/11/23 2134 07/12/23 0049 07/12/23 0501 07/12/23 0930  NA 124* 123* 122* 116*   < > 118* 119* 120* 119* 121*  K 3.6 3.8 3.9 4.6  --   --   --   --  4.6  --   CL 92* 92* 92* 85*  --   --   --   --  92*  --   CO2 23 24 23 24   --   --   --   --  22  --   GLUCOSE 123* 221* 154* 125*  --   --   --   --  104*  --   BUN 14 14 16 23   --   --   --   --  14  --   CREATININE 0.43* 0.49 0.52 0.49  --   --   --   --  0.57  --   CALCIUM  9.0 8.6* 8.6* 8.3*  --   --   --   --  8.0*  --    < > = values in this interval not displayed.    Liver Function Tests: Recent Labs  Lab 07/10/23 0417  AST 30  ALT 24  ALKPHOS 44  BILITOT 0.6  PROT 5.3*  ALBUMIN 3.3*   No results for input(s): "LIPASE", "AMYLASE" in the last 168 hours. No results for input(s): "AMMONIA" in the last 168  hours.  CBC: Recent Labs  Lab 07/08/23 2348 07/09/23 2213 07/10/23 0417 07/11/23 0536  WBC 6.2 11.3* 9.2 7.4  HGB 11.7* 11.6* 10.3* 8.3*  HCT 32.7* 31.5* 28.4* 22.8*  MCV 91.9 92.1 91.9 91.2  PLT 196 149* 154 124*    Cardiac Enzymes: No results for input(s): "CKTOTAL", "CKMB", "CKMBINDEX", "TROPONINI" in the last 168 hours.  BNP: Invalid input(s): "POCBNP"  CBG: Recent Labs  Lab 07/11/23 1229  GLUCAP 147*    Microbiology: No results found for this or any previous visit.  Coagulation Studies: No results for input(s): "LABPROT", "INR" in the last 72 hours.  Urinalysis: No results for input(s): "COLORURINE", "LABSPEC", "PHURINE", "GLUCOSEU", "HGBUR", "BILIRUBINUR", "KETONESUR", "PROTEINUR", "UROBILINOGEN", "NITRITE", "LEUKOCYTESUR" in the last 72 hours.  Invalid input(s): "APPERANCEUR"    Imaging: No results found.   Medications:    sodium chloride  (hypertonic) 30 mL/hr at 07/12/23 0423    aspirin  EC  81 mg Oral Daily   atorvastatin   40 mg Oral Daily   Chlorhexidine  Gluconate Cloth  6 each Topical Daily   docusate sodium   100 mg Oral BID   enoxaparin  (LOVENOX ) injection  40 mg Subcutaneous Q24H   Fe Fum-Vit C-Vit B12-FA  1 capsule Oral BID   feeding supplement  237 mL Oral BID BM   irbesartan   300 mg Oral Daily   multivitamin with minerals  1 tablet Oral Daily   pantoprazole   40 mg Oral Daily   polyethylene glycol  17 g Oral Daily   senna  2 tablet Oral Daily   sodium chloride   1 g Oral BID WC   acetaminophen , hydrALAZINE , HYDROcodone -acetaminophen , menthol -cetylpyridinium **OR** phenol, methocarbamol  **OR** methocarbamol  (ROBAXIN ) injection, metoCLOPramide  **OR** metoCLOPramide  (REGLAN ) injection, ondansetron  (ZOFRAN ) IV  Assessment/ Plan:  Shelly Armstrong is a 78 y.o.  female with medical problems of  hypertension, hyperlipidemia presented for low back pain, chronic constipation.  She had an accidental fall which resulted in right  intertrochanteric fracture.  She was admitted on 07/08/2023 for Hip fracture (HCC) [S72.009A] Hyponatremia [E87.1] Right hip pain [M25.551] Fall, initial encounter [W19.XXXA] Closed right hip fracture, initial encounter (HCC) [S72.001A] Pain in right femur [M89.8X5]   1.  Acute on chronic hyponatremia Patient appears to be euvolemic.  Serum osmolality at 253.Normal TSH 07/09/2023. A.m. cortisol was low on 416 but improved on 07/11/2023. Portable chest x-ray from 07/08/2023 shows normal heart size, lungs are clear.   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 3% hypertonic solution for management of acute hyponatremia started on 4/17.  Goal of correction is about 8 mEq in 24 hours. Pharmacy consult to assist with monitoring and dosing of 3% saline. - Sodium 119 today, progressing as planned.  - SPEP and kappa lambda ratio pending.   2.  Right intertrochanteric fracture She underwent right hip intramedullary nail placement on 07/09/2023.     Management as per orthopedic and primary team.    LOS: 3 Shelly Armstrong 4/18/202512:14 PM

## 2023-07-12 NOTE — TOC Progression Note (Signed)
 Transition of Care [facility]) - Progression Note    Patient Details  Name: Shelly Armstrong MRN: [id number] Date of Birth: 08-28-1945  Transition of Care [facility]) CM/SW Contact  Baird Bombard, RN Phone Number: 07/12/2023, 11:38 AM  Clinical Narrative:    PASSR obtained via [place] MUST [phone] A        Expected Discharge Plan and Services                                               Social Determinants of Health (SDOH) Interventions SDOH Screenings   Food Insecurity: No Food Insecurity (07/11/2023)  Housing: Unknown (07/11/2023)  Transportation Needs: No Transportation Needs (07/11/2023)  Utilities: Not At Risk (07/11/2023)  Financial Resource Strain: Low Risk  (07/04/2023)   Received from [facility]  Social Connections: Moderately Isolated (07/11/2023)  Tobacco Use: Low Risk  (07/09/2023)    Readmission Risk Interventions     No data to display

## 2023-07-12 NOTE — Progress Notes (Signed)
 Progress Note   Patient: Shelly Armstrong DOB: Apr 01, 1945 DOA: 07/08/2023     3 DOS: the patient was seen and examined on 07/12/2023   Brief hospital course: Taken from prior notes.  Shelly Armstrong is a 78 y.o. female with medical history significant for HTN, HLD admitted with displaced right intertrochanteric femur fracture following mechanical fall.  Orthopedic surgery was consulted and patient was taken to the OR on/15/25  4/16: S/p ORIF.  Labs with sodium of 122, chloride 92, leukocytosis resolved, hemoglobin of 10.3 decreased from 11.7 on admission likely due to intraoperative blood loss.  Starting on supplement. Hyponatremia labs ordered.  Hyponatremia seems chronic patient was on HCTZ at home which was discontinued.  Slight decrease in sodium after getting normal saline.  A.m. cortisol low at 4, serum osmolality low at 253, pending urine studies.  Will repeat a.m. cortisol tomorrow and also starting on salt tablet.  4/17: Worsening sodium, came back 116 with a.m. lab, given 1 L bolus of normal saline by night on-call provider-repeat sodium even lower at 115 Patient was feeling more lethargic and weak-transferring to stepdown, nephrology consult and starting on hypertonic saline. Hemoglobin with another significant drop to 8.3, no obvious bleeding but obviously patient recently had a hip fracture and surgery. Repeat a.m. cortisol levels at 11.7  4/18: Vital stable, sodium at 119>>121-patient remained on hypertonic saline, urine studies with sodium less than 10 and decreased urine osmolality at 196. Anemia panel with no significant abnormality.  Assessment and Plan: * Closed intertrochanteric fracture of hip, right, initial encounter (HCC) Accidental fall Preoperative clearance was obtained S/p ORIF-tolerated the procedure well.  She will be weightbearing as tolerated per orthopedic surgery PT/OT are recommending SNF -Continue with supportive care and pain  management  Hypertensive urgency Blood pressure now within goal.  It was significantly elevated on admission likely due to pain. -Continue home irbesartan  -Home HCTZ was discontinued due to hyponatremia  Chronic hyponatremia Acute on chronic hyponatremia. Seems chronic and slight worsening with normal saline.  Patient was also on HCTZ at home which is likely contributory.  Hyponatremia labs with slightly low serum and urine osmolality, urine sodium less than 10 TSH normal, a.m. repeat cortisol at 11.  Suspect SIADH Slowly improving with hypertonic saline-goal is 8 and 24-hour Nephrology is on board -Continue with hypertonic saline - Continue salt tablet -Monitor sodium  Acute postoperative anemia due to expected blood loss Hemoglobin decreased to 8.3, patient had his recent hip surgery and a fracture.  No other obvious bleeding at this time. No obvious dietary deficiency on anemia panel. -Started on supplement -Continue to monitor  Malnutrition of moderate degree Dietitian consult      Subjective: Patient was feeling little improved when seen today.  Still having some nausea but no vomiting.  Weakness seems improving.  Physical Exam: Vitals:   07/12/23 1000 07/12/23 1032 07/12/23 1100 07/12/23 1200  BP: (!) 99/53  (!) 155/64 (!) 134/53  Pulse: 85 87  91  Resp: 13 17 18 14   Temp: 97.6 F (36.4 C)     TempSrc: Oral     SpO2: 99% 97%  99%  Weight:      Height:       General.  Frail elderly lady, in no acute distress. Pulmonary.  Lungs clear bilaterally, normal respiratory effort. CV.  Regular rate and rhythm, no JVD, rub or murmur. Abdomen.  Soft, nontender, nondistended, BS positive. CNS.  Alert and oriented .  No focal neurologic deficit. Extremities.  No edema, no cyanosis, pulses intact and symmetrical. Psychiatry.  Judgment and insight appears normal.   Data Reviewed: Prior data reviewed  Family Communication: Discussed with daughter at  bedside  Disposition: Status is: Inpatient Remains inpatient appropriate because: Severity of illness  Planned Discharge Destination: Skilled nursing facility  DVT prophylaxis.  Lovenox  Time spent: 50 minutes  This record has been created using Conservation officer, historic buildings. Errors have been sought and corrected,but may not always be located. Such creation errors do not reflect on the standard of care.   Author: Amaryllis Dare, MD 07/12/2023 2:10 PM  For on call review [email or web address].

## 2023-07-12 NOTE — NC FL2 (Signed)
 [place]  MEDICAID FL2 LEVEL OF CARE FORM     IDENTIFICATION  Patient Name: Shelly Armstrong Birthdate: 1946-01-27 Sex: female Admission Date (Current Location): 07/08/2023  [facility] and [place] Number:  Chiropodist and Address:  [facility], [address], [place], [place] [address]      Provider Number: [id number]  Attending Physician Name and Address:  Luna Salinas, MD  Relative Name and Phone Number:  Madie Schilling (Daughter)  [phone] [facility]    Current Level of Care: Hospital Recommended Level of Care: Skilled Nursing Facility Prior Approval Number:    Date Approved/Denied:   PASRR Number: [id number]  Discharge Plan:      Current Diagnoses: Patient Active Problem List   Diagnosis Date Noted   Acute postoperative anemia due to expected blood loss 07/11/2023   Malnutrition of moderate degree 07/10/2023   Closed intertrochanteric fracture of hip, right, initial encounter (HCC) 07/09/2023   Hypertensive urgency 07/09/2023   Constipation 03/09/2023   Dyslipidemia 03/09/2023   GERD without esophagitis 03/09/2023   Essential hypertension 03/09/2023   Chronic hyponatremia 03/08/2023   Closed fracture of right proximal humerus 06/24/2018   Contusion of left leg 06/24/2018   Unstable angina (HCC) 12/03/2016   Health care maintenance 06/11/2014   Combined hyperlipidemia 09/26/2013   Edema 09/26/2013   HTN (hypertension), benign 09/26/2013   Obesity 09/26/2013   Osteopenia 09/26/2013   Prediabetes 09/26/2013    Orientation RESPIRATION BLADDER Height & Weight     Self, Situation, Place, Time  Normal Continent, External catheter Weight: 131 lb (59.4 kg) Height:  5\' 1"  (154.9 cm)  BEHAVIORAL SYMPTOMS/MOOD NEUROLOGICAL BOWEL NUTRITION STATUS      Continent Diet (reg)  AMBULATORY STATUS COMMUNICATION OF NEEDS Skin   Limited Assist Verbally Surgical wounds (R hip - honeycomb)                       Personal Care  Assistance Level of Assistance  Bathing, Feeding, Dressing Bathing Assistance: Limited assistance Feeding assistance: Limited assistance Dressing Assistance: Limited assistance     Functional Limitations Info             SPECIAL CARE FACTORS FREQUENCY  PT (By licensed PT), OT (By licensed OT)     PT Frequency: 5 times per week OT Frequency: 5 times per week            Contractures      Additional Factors Info  Code Status, Allergies Code Status Info: full Allergies Info: Sulfamethoxazole-trimethoprim           Current Medications (07/12/2023):  This is the current hospital active medication list Current Facility-Administered Medications  Medication Dose Route Frequency Provider Last Rate Last Admin   acetaminophen  (TYLENOL ) tablet 1,000 mg  1,000 mg Oral Q6H PRN Krugh, Marissa C, DO       aspirin  EC tablet 81 mg  81 mg Oral Daily Krugh, Marissa C, DO   81 mg at 07/12/23 1022   atorvastatin  (LIPITOR) tablet 40 mg  40 mg Oral Daily Brion Cancel V, MD   40 mg at 07/12/23 1022   Chlorhexidine  Gluconate Cloth 2 % PADS 6 each  6 each Topical Daily Amin, Sumayya, MD   6 each at 07/11/23 1415   docusate sodium  (COLACE) capsule 100 mg  100 mg Oral BID Aberman, Zachary, MD   100 mg at 07/12/23 1022   enoxaparin  (LOVENOX ) injection 40 mg  40 mg Subcutaneous Q24H Krugh, Marissa C, DO  40 mg at 07/12/23 0049   Fe Fum-Vit C-Vit B12-FA (TRIGELS-F FORTE) capsule 1 capsule  1 capsule Oral BID Amin, Sumayya, MD   1 capsule at 07/11/23 2104   feeding supplement (ENSURE ENLIVE / ENSURE PLUS) liquid 237 mL  237 mL Oral BID BM Amin, Sumayya, MD   237 mL at 07/12/23 1023   hydrALAZINE  (APRESOLINE ) injection 10 mg  10 mg Intravenous Q4H PRN Krugh, Marissa C, DO       HYDROcodone -acetaminophen  (NORCO/VICODIN) 5-325 MG per tablet 1-2 tablet  1-2 tablet Oral Q6H PRN Duncan, Hazel V, MD   2 tablet at 07/12/23 1032   irbesartan  (AVAPRO ) tablet 300 mg  300 mg Oral Daily Krugh, Marissa C, DO   300  mg at 07/12/23 1022   menthol -cetylpyridinium (CEPACOL) lozenge 3 mg  1 lozenge Oral PRN Aberman, Zachary, MD       Or   phenol (CHLORASEPTIC) mouth spray 1 spray  1 spray Mouth/Throat PRN Aberman, Zachary, MD       methocarbamol  (ROBAXIN ) tablet 500 mg  500 mg Oral Q6H PRN Duncan, Hazel V, MD   500 mg at 07/11/23 0730   Or   methocarbamol  (ROBAXIN ) injection 500 mg  500 mg Intravenous Q6H PRN Lanetta Pion, MD       metoCLOPramide  (REGLAN ) tablet 5-10 mg  5-10 mg Oral Q8H PRN Aberman, Zachary, MD       Or   metoCLOPramide  (REGLAN ) injection 5-10 mg  5-10 mg Intravenous Q8H PRN Aberman, Zachary, MD       multivitamin with minerals tablet 1 tablet  1 tablet Oral Daily Amin, Sumayya, MD   1 tablet at 07/12/23 1022   ondansetron  (ZOFRAN ) injection 4 mg  4 mg Intravenous Q8H PRN Krugh, Marissa C, DO   4 mg at 07/11/23 4696   pantoprazole  (PROTONIX ) EC tablet 40 mg  40 mg Oral Daily Krugh, Marissa C, DO   40 mg at 07/12/23 1022   polyethylene glycol (MIRALAX  / GLYCOLAX ) packet 17 g  17 g Oral Daily Krugh, Marissa C, DO   17 g at 07/12/23 1021   senna (SENOKOT) tablet 17.2 mg  2 tablet Oral Daily Krugh, Marissa C, DO   17.2 mg at 07/12/23 1022   sodium chloride  (hypertonic) 3 % solution   Intravenous Continuous Anola King, NP 30 mL/hr at 07/12/23 0423 New Bag at 07/12/23 0423   sodium chloride  tablet 1 g  1 g Oral BID WC Amin, Sumayya, MD   1 g at 07/12/23 1022     Discharge Medications: Please see discharge summary for a list of discharge medications.  Relevant Imaging Results:  Relevant Lab Results:   Additional Information SS #: [id number]  Markeem Noreen E Eryn Marandola, LCSW

## 2023-07-12 NOTE — Consult Note (Signed)
 MEDICATION RELATED CONSULT NOTE  Pharmacy Consult for 3% NaCl Indication: Hyponatremia  Patient Measurements: Height: 5\' 1"  (154.9 cm) Weight: 59.4 kg (131 lb) IBW/kg (Calculated) : 47.8  Intake/Output from previous day: 04/17 0701 - 04/18 0700 In: 413 [P.O.:240; I.V.:173] Out: 1075 [Urine:1075] Intake/Output from this shift: Total I/O In: 240 [P.O.:240] Out: -   Labs: Recent Labs    07/09/23 2213 07/10/23 0417 07/11/23 0536 07/12/23 0501  WBC 11.3* 9.2 7.4  --   HGB 11.6* 10.3* 8.3*  --   HCT 31.5* 28.4* 22.8*  --   PLT 149* 154 124*  --   CREATININE 0.49 0.52 0.49 0.57  ALBUMIN  --  3.3*  --   --   PROT  --  5.3*  --   --   AST  --  30  --   --   ALT  --  24  --   --   ALKPHOS  --  44  --   --   BILITOT  --  0.6  --   --   BILIDIR  --  0.1  --   --   IBILI  --  0.5  --   --    Estimated Creatinine Clearance: 47.9 mL/min (by C-G formula based on SCr of 0.57 mg/dL).  Microbiology: No results found for this or any previous visit (from the past 720 hours).  Medical History: Past Medical History:  Diagnosis Date   Chickenpox    Edema    feet/legs   Hyperglycemia    Hyperlipidemia    Hypertension    Osteopenia    PONV (postoperative nausea and vomiting)    after hand surgery   Medications:  4/17 1312 3% NaCl at 30 cc/hr  Na Levels: 4/17 1036 = 115 Hypertonic saline started  4/17 1454 = 116  4/17 1653 = 118  4/17 2134 = 119 4/17 0049 = 120 4/18 0501 = 119 4/18 0930 = 121 4/18 1249 = 121 4/18 1636 = 125  Goal of Therapy:  Increase Na by 8 - 12 mmol in 24h  Plan:  --Continue current 3%NaCl rate of 30 cc/hr --Pharmacy to assist in monitoring sodium per protocol while on hypertonic saline --Will notify provider if sodium rises > 4 mmol/L over 2 hours or > 6 mmol/L over 4 hours  Craven Do, PharmD Pharmacy Resident  07/12/2023 5:15 PM

## 2023-07-12 NOTE — Plan of Care (Signed)
  Problem: Education: Goal: Knowledge of General Education information will improve Description: Including pain rating scale, medication(s)/side effects and non-pharmacologic comfort measures Outcome: Progressing   Problem: Clinical Measurements: [place]

## 2023-07-12 NOTE — Progress Notes (Signed)
  Subjective: 3 Days Post-Op Procedure(s) (LRB): FIXATION, FRACTURE, INTERTROCHANTERIC, WITH INTRAMEDULLARY ROD (Right) Patient reports pain as severe in the left hip this AM..   Patient is  reports she is not feeling well, Nauseous. PT and care management to assist with discharge planning. Negative for chest pain and shortness of breath Fever: no Gastrointestinal:Positive for nausea and vomiting  Objective: Vital signs in last 24 hours: Temp:  [97.6 F (36.4 C)-98.3 F (36.8 C)] 97.6 F (36.4 C) (04/18 1000) Pulse Rate:  [73-95] 91 (04/18 1200) Resp:  [4-23] 14 (04/18 1200) BP: (95-155)/(40-87) 134/53 (04/18 1200) SpO2:  [93 %-100 %] 99 % (04/18 1200)  Intake/Output from previous day:  Intake/Output Summary (Last 24 hours) at 07/12/2023 1315 Last data filed at 07/12/2023 1037 Gross per 24 hour  Intake 653.03 ml  Output 1075 ml  Net -421.97 ml    Intake/Output this shift: Total I/O In: 240 [P.O.:240] Out: -   Labs: Recent Labs    07/09/23 2213 07/10/23 0417 07/11/23 0536  HGB 11.6* 10.3* 8.3*   Recent Labs    07/10/23 0417 07/11/23 0536  WBC 9.2 7.4  RBC 3.09* 2.50*  2.45*  HCT 28.4* 22.8*  PLT 154 124*   Recent Labs    07/11/23 0536 07/11/23 1036 07/12/23 0501 07/12/23 0930  NA 116*   < > 119* 121*  K 4.6  --  4.6  --   CL 85*  --  92*  --   CO2 24  --  22  --   BUN 23  --  14  --   CREATININE 0.49  --  0.57  --   GLUCOSE 125*  --  104*  --   CALCIUM  8.3*  --  8.0*  --    < > = values in this interval not displayed.   No results for input(s): "LABPT", "INR" in the last 72 hours.   EXAM General - Patient is Alert, Appropriate, and Oriented Extremity - Neurovascular intact Incision: dressing C/D/I No cellulitis present Compartment soft Dressing/Incision - clean, dry, no drainage, honeycomb dressing intact to the right hip. Motor Function - intact, moving foot and toes well on exam.  Abdomen soft with intact bowel sounds this AM.  Past  Medical History:  Diagnosis Date   Chickenpox    Edema    feet/legs   Hyperglycemia    Hyperlipidemia    Hypertension    Osteopenia    PONV (postoperative nausea and vomiting)    after hand surgery    Assessment/Plan: 3 Days Post-Op Procedure(s) (LRB): FIXATION, FRACTURE, INTERTROCHANTERIC, WITH INTRAMEDULLARY ROD (Right) Principal Problem:   Closed intertrochanteric fracture of hip, right, initial encounter (HCC) Active Problems:   Chronic hyponatremia   Hypertensive urgency   Malnutrition of moderate degree   Acute postoperative anemia due to expected blood loss  Estimated body mass index is 24.75 kg/m as calculated from the following:   Height as of this encounter: 5\' 1"  (1.549 m).   Weight as of this encounter: 59.4 kg.  Labs and vitals reviewed. Na 121, being managed by internal medicine. Last Hg 8.3, continue to monitor for possible transfusion. N/V, Reglan  and Zofran  standing order. Monitor constipation with her taking narcotics. Reports moderate-severe pain in the right hip.  Continue with Norco. Continue with PT as tolerated, will likely need SNF.  DVT Prophylaxis - Lovenox  and TED hose Weight-Bearing as tolerated to right leg  Standley Earing, PA-C [facility] 07/12/2023, 1:15 PM

## 2023-07-12 NOTE — Progress Notes (Signed)
 Physical Therapy Treatment Patient Details Name: Shelly Armstrong MRN: [id number] DOB: [birth year] Today's Date: 07/12/2023   History of Present Illness presented to ER status post mechanical fall in home environment, acute onset of R hip pain; admitted for management of closed intertrochanteric hip fracture, s/p IM nailing (07/09/23), WBAT. transfer to ICU for management of low sodium    PT Comments  Patient is agreeable to PT session. She reports feeling woozy. Pain is moderate in the right hip. She continues to require assistance for bed mobility and transfers. Pre-gait activity performed with minimal assistance. Discharge plan remains appropriate. PT will continue to follow to maximize independence and decrease caregiver burden.    If plan is discharge home, recommend the following: A lot of help with walking and/or transfers;A lot of help with bathing/dressing/bathroom   Can travel by private vehicle     No  Equipment Recommendations  None recommended by PT    Recommendations for Other Services       Precautions / Restrictions Precautions Precautions: Fall Recall of Precautions/Restrictions: Impaired Restrictions Weight Bearing Restrictions Per Provider Order: Yes RLE Weight Bearing Per Provider Order: Weight bearing as tolerated     Mobility  Bed Mobility Overal bed mobility: Needs Assistance Bed Mobility: Supine to Sit, Sit to Supine     Supine to sit: Mod assist Sit to supine: Mod assist   General bed mobility comments: assistance for BLE and intermittently for trunk support. verbal cues for technique and sequencing    Transfers Overall transfer level: Needs assistance Equipment used: Rolling walker (2 wheels) Transfers: Sit to/from Stand Sit to Stand: Min assist           General transfer comment: verbal cues for hand placement with standing. lifting assistance required    Ambulation/Gait             Pre-gait activities: side steps performed to  the right with faciliation for weight shifting and advancement of rolling walker. Min A provided for safety General Gait Details: further walking limited by reported dizziness with activity. vitals stable throughout   Stairs             Wheelchair Mobility     Tilt Bed    Modified Rankin (Stroke Patients Only)       Balance Overall balance assessment: Needs assistance Sitting-balance support: No upper extremity supported, Feet supported Sitting balance-Leahy Scale: Fair     Standing balance support: Bilateral upper extremity supported Standing balance-Leahy Scale: Poor Standing balance comment: external support required for standing                            Communication Communication Communication: No apparent difficulties  Cognition Arousal: Alert Behavior During Therapy: WFL for tasks assessed/performed   PT - Cognitive impairments: No apparent impairments                         Following commands: Intact      Cueing Cueing Techniques: Verbal cues  Exercises      General Comments General comments (skin integrity, edema, etc.): frequency adjusted based on progress. discharge plan remains appropriate      Pertinent Vitals/Pain Pain Assessment Pain Assessment: Faces Faces Pain Scale: Hurts even more Pain Location: R hip Pain Descriptors / Indicators: Aching, Discomfort Pain Intervention(s): Limited activity within patient's tolerance, Premedicated before session, Repositioned    Home Living  Prior Function            PT Goals (current goals can now be found in the care plan section) Acute Rehab PT Goals Patient Stated Goal: to return home PT Goal Formulation: With patient Time For Goal Achievement: 07/24/23 Potential to Achieve Goals: Good Progress towards PT goals: Progressing toward goals    Frequency    7X/week      PT Plan      Co-evaluation              AM-PAC  PT 6 Clicks Mobility   Outcome Measure  Help needed turning from your back to your side while in a flat bed without using bedrails?: A Little Help needed moving from lying on your back to sitting on the side of a flat bed without using bedrails?: A Lot Help needed moving to and from a bed to a chair (including a wheelchair)?: A Lot Help needed standing up from a chair using your arms (e.g., wheelchair or bedside chair)?: A Lot Help needed to walk in hospital room?: A Little Help needed climbing 3-5 steps with a railing? : A Lot 6 Click Score: 14    End of Session   Activity Tolerance: Patient tolerated treatment well Patient left: in bed;with call bell/phone within reach;with SCD's reapplied;with nursing/sitter in room Nurse Communication: Mobility status PT Visit Diagnosis: Muscle weakness (generalized) (M62.81);Difficulty in walking, not elsewhere classified (R26.2);Pain     Time: 9174-9151 PT Time Calculation (min) (ACUTE ONLY): 23 min  Charges:    $Therapeutic Activity: 23-37 mins PT General Charges $$ ACUTE PT VISIT: 1 Visit                     Randine Essex, PT, MPT    Randine LULLA Essex 07/12/2023, 8:56 AM

## 2023-07-13 DIAGNOSIS — E440 Moderate protein-calorie malnutrition: Secondary | ICD-10-CM | POA: Diagnosis not present

## 2023-07-13 DIAGNOSIS — S72141A Displaced intertrochanteric fracture of right femur, initial encounter for closed fracture: Secondary | ICD-10-CM | POA: Diagnosis not present

## 2023-07-13 DIAGNOSIS — E871 Hypo-osmolality and hyponatremia: Secondary | ICD-10-CM | POA: Diagnosis not present

## 2023-07-13 DIAGNOSIS — I160 Hypertensive urgency: Secondary | ICD-10-CM | POA: Diagnosis not present

## 2023-07-13 LAB — SODIUM
Sodium: 125 mmol/L — ABNORMAL LOW (ref 135–145)
Sodium: 122 mmol/L — ABNORMAL LOW (ref 135–145)
Sodium: 123 mmol/L — ABNORMAL LOW (ref 135–145)
Sodium: 122 mmol/L — ABNORMAL LOW (ref 135–145)
Sodium: 123 mmol/L — ABNORMAL LOW (ref 135–145)
Sodium: 124 mmol/L — ABNORMAL LOW (ref 135–145)

## 2023-07-13 MED ORDER — ORAL CARE MOUTH RINSE: 15.0000 mL | OROMUCOSAL | Status: DC | PRN

## 2023-07-13 MED ORDER — SODIUM CHLORIDE 1 G PO TABS
1.0000 g | ORAL_TABLET | Freq: Three times a day (TID) | ORAL | Status: DC
  Administered 2023-07-13 – 2023-07-16 (×11): 1 g via ORAL
  Filled 2023-07-13 (×11): qty 1

## 2023-07-13 NOTE — Progress Notes (Signed)
 PT Cancellation Note  Patient Details Name: NAEEMA PATLAN MRN: [id number] DOB: February 11, 1946   Cancelled Treatment:     PT attempt. Pt mid transfer to [facility] from recliner with RN tech. Endorsing nausea and requested author return at a later time. Acute PT will continue to follow and progress per current POC.    Koleen Perna 07/13/2023, 8:37 AM

## 2023-07-13 NOTE — Progress Notes (Signed)
 Physical Therapy Treatment Patient Details Name: Shelly Armstrong MRN: [id number] DOB: May 25, 1945 Today's Date: 07/13/2023   History of Present Illness presented to ER status post mechanical fall in home environment, acute onset of R hip pain; admitted for management of closed intertrochanteric hip fracture, s/p IM nailing (07/09/23), WBAT. transfer to ICU for management of low sodium    PT Comments  Pt was long sitting in bed upon arrival. She was premedicated for pain however endorses severe 9/10 with movements. Pain  does limit session but not as much as pt's nausea. With encouragement, pt did agree to OOB activity. She requires a lot of time + mod assist to exit bed. Pt tolerated standing 2 x EOB prior to ambulating ~ 5 ft to [facility]. Successfully urinated 200 mls prior to ambulating ~ 10 ft. HR between 104-122 bpm with gait. Distance limited by pain and nausea. At conclusion of session, pt was in bed with call bell in reach. DC recs remain appropriate to maximize pt's independence and safe functional abilities prior to returning home.    If plan is discharge home, recommend the following: A little help with walking and/or transfers;A lot of help with bathing/dressing/bathroom;Assistance with cooking/housework;Direct supervision/assist for medications management;Direct supervision/assist for financial management;Assist for transportation;Help with stairs or ramp for entrance     Equipment Recommendations  Rolling walker (2 wheels);BSC/3in1       Precautions / Restrictions Precautions Precautions: Fall Recall of Precautions/Restrictions: Intact Restrictions Weight Bearing Restrictions Per Provider Order: Yes RLE Weight Bearing Per Provider Order: Weight bearing as tolerated     Mobility  Bed Mobility Overal bed mobility: Needs Assistance Bed Mobility: Supine to Sit, Sit to Supine  Supine to sit: Mod assist Sit to supine: Mod assist General bed mobility comments: Increased time. pain  limited    Transfers Overall transfer level: Needs assistance Equipment used: Rolling walker (2 wheels) Transfers: Sit to/from Stand Sit to Stand: Min assist, Contact guard assist  General transfer comment: pt stood 2 x EOB and 1 x from [facility]. min A first attempt that progressed to CGA only for 2nd 3rd STS    Ambulation/Gait Ambulation/Gait assistance: Contact guard assist Gait Distance (Feet): 10 Feet Assistive device: Rolling walker (2 wheels) Gait Pattern/deviations: Step-to pattern, Antalgic Gait velocity: decreased  General Gait Details: Pt was bale to ambulate ~ 10 ft total with very slow antalgic step to pattern. distance limited by pain and nausea    Balance Overall balance assessment: Needs assistance Sitting-balance support: No upper extremity supported, Feet supported Sitting balance-Leahy Scale: Good Sitting balance - Comments: no LOB observed in sitting   Standing balance support: Bilateral upper extremity supported Standing balance-Leahy Scale: Fair      Hotel manager: No apparent difficulties  Cognition Arousal: Alert Behavior During Therapy: WFL for tasks assessed/performed   PT - Cognitive impairments: No apparent impairments    PT - Cognition Comments: Pt is A and O x 4. Needed alot of encouragement to participate but eventually agreeable. Following commands: Intact      Cueing Cueing Techniques: Verbal cues     General Comments General comments (skin integrity, edema, etc.): Author encourged pt to try to increase daily activity to promote improvemets however pt remains resistive due to pain and nausea." I know I will need rehab, My husband isnt going to be able to help me enough." Pt successfully urinated on BSC prior to ambulating ~ 10 ft      Pertinent Vitals/Pain Pain Assessment Pain Assessment: 0-10 Pain  Score: 8  Pain Location: R hip Pain Descriptors / Indicators: Aching, Discomfort Pain Intervention(s): Limited  activity within patient's tolerance, Monitored during session, Premedicated before session, Repositioned (encouraged to use ice pack but unwilling)     PT Goals (current goals can now be found in the care plan section) Acute Rehab PT Goals Patient Stated Goal: rehab then home Progress towards PT goals: Progressing toward goals    Frequency    7X/week       AM-PAC PT "6 Clicks" Mobility   Outcome Measure  Help needed turning from your back to your side while in a flat bed without using bedrails?: A Little Help needed moving from lying on your back to sitting on the side of a flat bed without using bedrails?: A Lot Help needed moving to and from a bed to a chair (including a wheelchair)?: A Little Help needed standing up from a chair using your arms (e.g., wheelchair or bedside chair)?: A Little Help needed to walk in hospital room?: A Little Help needed climbing 3-5 steps with a railing? : A Lot 6 Click Score: 16    End of Session   Activity Tolerance: Patient limited by fatigue;Patient limited by pain Patient left: in bed;with call bell/phone within reach;with SCD's reapplied;with nursing/sitter in room Nurse Communication: Mobility status PT Visit Diagnosis: Muscle weakness (generalized) (M62.81);Difficulty in walking, not elsewhere classified (R26.2);Pain Pain - Right/Left: Right Pain - part of body: Hip     Time: 4782-9562 PT Time Calculation (min) (ACUTE ONLY): 22 min  Charges:    $Therapeutic Activity: 8-22 mins PT General Charges $$ ACUTE PT VISIT: 1 Visit                     Chester Costa PTA 07/13/23, 3:34 PM

## 2023-07-13 NOTE — TOC Progression Note (Signed)
 Transition of Care [facility]) - Progression Note    Patient Details  Name: Shelly Armstrong MRN: [id number] Date of Birth: [birth year]  Transition of Care [facility]) CM/SW Contact  Iram Astorino E Marvell Stavola, LCSW Phone Number: 07/13/2023, 10:42 AM  Clinical Narrative:    CSW went to bedside to present bed offers. Patient sleeping.        Expected Discharge Plan and Services                                               Social Determinants of Health (SDOH) Interventions SDOH Screenings   Food Insecurity: No Food Insecurity (07/11/2023)  Housing: Unknown (07/11/2023)  Transportation Needs: No Transportation Needs (07/11/2023)  Utilities: Not At Risk (07/11/2023)  Financial Resource Strain: Low Risk  (07/04/2023)   Received from [facility]  Social Connections: Moderately Isolated (07/11/2023)  Tobacco Use: Low Risk  (07/09/2023)    Readmission Risk Interventions     No data to display

## 2023-07-13 NOTE — Progress Notes (Signed)
 [facility]  ROUNDING NOTE   Subjective:   Taken off hypertonic infusion.   Sodium 123 at 9:11am.   Patient placed on fluid restriction. She states that she is concerned about this. She is having some anxiety.   Continues to complain of weakness.   Objective:  Vital signs in last 24 hours:  Temp:  [97.7 F (36.5 C)-98.3 F (36.8 C)] 97.7 F (36.5 C) (04/19 0600) Pulse Rate:  [68-102] 71 (04/19 1000) Resp:  [9-24] 10 (04/19 1000) BP: (115-153)/(47-85) 126/51 (04/19 1000) SpO2:  [93 %-100 %] 100 % (04/19 1000)  Weight change:  Filed Weights   07/08/23 2149  Weight: 59.4 kg    Intake/Output: I/O last 3 completed shifts: In: 1156.1 [P.O.:480; I.V.:676.1] Out: 1175 [Urine:1175]   Intake/Output this shift:  Total I/O In: -  Out: 10 [Urine:10]  Physical Exam: General: NAD, laying in bed  Head: Normocephalic, atraumatic. Moist oral mucosal membranes  Eyes: Anicteric  Lungs:  Clear to auscultation  Heart: Regular rate and rhythm  Abdomen:  Soft, nontender,   Extremities:  No peripheral edema.  Neurologic: Alert, moving all four extremities  Skin: No lesions       Basic Metabolic Panel: Recent Labs  Lab 07/08/23 2348 07/09/23 2213 07/10/23 0417 07/11/23 0536 07/11/23 1036 07/12/23 0501 07/12/23 0930 07/12/23 1636 07/12/23 2101 07/13/23 0121 07/13/23 0527 07/13/23 0911  NA 124* 123* 122* 116*   < > 119*   < > 125* 126* 125* 122* 123*  K 3.6 3.8 3.9 4.6  --  4.6  --   --   --   --   --   --   CL 92* 92* 92* 85*  --  92*  --   --   --   --   --   --   CO2 23 24 23 24   --  22  --   --   --   --   --   --   GLUCOSE 123* 221* 154* 125*  --  104*  --   --   --   --   --   --   BUN 14 14 16 23   --  14  --   --   --   --   --   --   CREATININE 0.43* 0.49 0.52 0.49  --  0.57  --   --   --   --   --   --   CALCIUM  9.0 8.6* 8.6* 8.3*  --  8.0*  --   --   --   --   --   --    < > = values in this interval not displayed.    Liver Function  Tests: Recent Labs  Lab 07/10/23 0417  AST 30  ALT 24  ALKPHOS 44  BILITOT 0.6  PROT 5.3*  ALBUMIN 3.3*   No results for input(s): "LIPASE", "AMYLASE" in the last 168 hours. No results for input(s): "AMMONIA" in the last 168 hours.  CBC: Recent Labs  Lab 07/08/23 2348 07/09/23 2213 07/10/23 0417 07/11/23 0536  WBC 6.2 11.3* 9.2 7.4  HGB 11.7* 11.6* 10.3* 8.3*  HCT 32.7* 31.5* 28.4* 22.8*  MCV 91.9 92.1 91.9 91.2  PLT 196 149* 154 124*    Cardiac Enzymes: No results for input(s): "CKTOTAL", "CKMB", "CKMBINDEX", "TROPONINI" in the last 168 hours.  BNP: Invalid input(s): "POCBNP"  CBG: Recent Labs  Lab 07/11/23 1229  GLUCAP 147*    Microbiology: No  results found for this or any previous visit.  Coagulation Studies: No results for input(s): "LABPROT", "INR" in the last 72 hours.  Urinalysis: No results for input(s): "COLORURINE", "LABSPEC", "PHURINE", "GLUCOSEU", "HGBUR", "BILIRUBINUR", "KETONESUR", "PROTEINUR", "UROBILINOGEN", "NITRITE", "LEUKOCYTESUR" in the last 72 hours.  Invalid input(s): "APPERANCEUR"    Imaging: No results found.   Medications:    sodium chloride  (hypertonic) Stopped (07/12/23 1730)    aspirin  EC  81 mg Oral Daily   atorvastatin   40 mg Oral Daily   Chlorhexidine  Gluconate Cloth  6 each Topical Daily   docusate sodium   100 mg Oral BID   enoxaparin  (LOVENOX ) injection  40 mg Subcutaneous Q24H   Fe Fum-Vit C-Vit B12-FA  1 capsule Oral BID   feeding supplement  237 mL Oral BID BM   irbesartan   300 mg Oral Daily   multivitamin with minerals  1 tablet Oral Daily   pantoprazole   40 mg Oral Daily   polyethylene glycol  17 g Oral Daily   senna  2 tablet Oral Daily   sodium chloride   1 g Oral TID WC   acetaminophen , hydrALAZINE , HYDROcodone -acetaminophen , menthol -cetylpyridinium **OR** phenol, methocarbamol  **OR** methocarbamol  (ROBAXIN ) injection, metoCLOPramide  **OR** metoCLOPramide  (REGLAN ) injection, ondansetron  (ZOFRAN )  IV  Assessment/ Plan:  Ms. TALITHA DICARLO is a 78 y.o.  female with medical problems of  hypertension, hyperlipidemia presented for low back pain, chronic constipation.  She had an accidental fall which resulted in right intertrochanteric fracture.  She was admitted on 07/08/2023 for Hip fracture (HCC) [S72.009A] Hyponatremia [E87.1] Right hip pain [M25.551] Fall, initial encounter [W19.XXXA] Closed right hip fracture, initial encounter (HCC) [S72.001A] Pain in right femur [M89.8X5]   1.  Acute on chronic hyponatremia Patient appears to be euvolemic.  Serum osmolality at 253. Normal TSH 07/09/2023. A.m. cortisol was low on 416 but improved on 07/11/2023. - continue to hold hypertonic infusion.  - continue fluid restriction - increased salt tablets - education given to patient.  - pending SPEP     2.  Right intertrochanteric fracture Patient underwent right hip intramedullary nail placement on 07/09/2023 by Dr. Clyda Dark.          LOS: 4 Haitham Dolinsky 4/19/202511:58 AM

## 2023-07-13 NOTE — Assessment & Plan Note (Signed)
 Accidental fall Preoperative clearance was obtained S/p ORIF-tolerated the procedure well.  She will be weightbearing as tolerated per orthopedic surgery PT/OT are recommending SNF -Continue with supportive care and pain management

## 2023-07-13 NOTE — Consult Note (Signed)
 MEDICATION RELATED CONSULT NOTE  Pharmacy Consult for 3% NaCl Indication: Hyponatremia  Patient Measurements: Height: 5\' 1"  (154.9 cm) Weight: 59.4 kg (131 lb) IBW/kg (Calculated) : 47.8  Intake/Output from previous day: 04/18 0701 - 04/19 0700 In: 1156.1 [P.O.:480; I.V.:676.1] Out: 450 [Urine:450] Intake/Output from this shift: Total I/O In: -  Out: 10 [Urine:10]  Labs: Recent Labs    07/11/23 0536 07/12/23 0501  WBC 7.4  --   HGB 8.3*  --   HCT 22.8*  --   PLT 124*  --   CREATININE 0.49 0.57   Estimated Creatinine Clearance: 47.9 mL/min (by C-G formula based on SCr of 0.57 mg/dL).  Microbiology: No results found for this or any previous visit (from the past 720 hours).  Medical History: Past Medical History:  Diagnosis Date   Chickenpox    Edema    feet/legs   Hyperglycemia    Hyperlipidemia    Hypertension    Osteopenia    PONV (postoperative nausea and vomiting)    after hand surgery   Medications:  4/17 1312 3% NaCl at 30 cc/hr 4/16 NaCl 1g twice daily  Na Levels: 4/17 1036 = 115 Hypertonic saline started  4/17 1454 = 116  4/17 1653 = 118  4/17 2134 = 119 4/17 0049 = 120 4/18 0501 = 119 4/18 0930 = 121 4/18 1249 = 121 4/18 1636 = 125(3% NaCl stopped@1732 ) 4/18 2101 = 126 4/19 0121 = 125 4/19 0527 = 122 4/19 0911 = 123 4/19 1248 = 122  Goal of Therapy:  Increase Na by 8 - 12 mmol in 24h  Plan:  --Will increase NaCl tablets to 1g TID --Continue to hold 3% NaCl infusion --Pharmacy to assist in monitoring sodium per protocol while on hypertonic saline --Will notify provider if sodium rises > 4 mmol/L over 2 hours or > 6 mmol/L over 4 hours  Sadeel Fiddler A Krystel Fletchall, PharmD Clinical Pharmacist 07/13/2023 1:31 PM

## 2023-07-13 NOTE — Assessment & Plan Note (Signed)
 Blood pressure now within goal.  It was significantly elevated on admission likely due to pain. -Continue home irbesartan  -Home HCTZ was discontinued due to hyponatremia

## 2023-07-13 NOTE — Progress Notes (Addendum)
 Subjective: 4 Days Post-Op Procedure(s) (LRB): FIXATION, FRACTURE, INTERTROCHANTERIC, WITH INTRAMEDULLARY ROD (Right) Patient reports pain as severe in the left hip this AM..   Patient is  reports she is still having some nausea. Did have a small BM last night. PT and care management to assist with discharge planning. Negative for chest pain and shortness of breath Fever: no Gastrointestinal: Positive for nausea  Objective: Vital signs in last 24 hours: Temp:  [97.7 F (36.5 C)-98.3 F (36.8 C)] 97.7 F (36.5 C) (04/19 0600) Pulse Rate:  [68-102] 71 (04/19 1000) Resp:  [9-24] 10 (04/19 1000) BP: (115-153)/(47-85) 126/51 (04/19 1000) SpO2:  [93 %-100 %] 100 % (04/19 1000)  Intake/Output from previous day:  Intake/Output Summary (Last 24 hours) at 07/13/2023 1148 Last data filed at 07/13/2023 0843 Gross per 24 hour  Intake 916.14 ml  Output 460 ml  Net 456.14 ml    Intake/Output this shift: Total I/O In: -  Out: 10 [Urine:10]  Labs: Recent Labs    07/11/23 0536  HGB 8.3*   Recent Labs    07/11/23 0536  WBC 7.4  RBC 2.50*  2.45*  HCT 22.8*  PLT 124*   Recent Labs    07/11/23 0536 07/11/23 1036 07/12/23 0501 07/12/23 0930 07/13/23 0527 07/13/23 0911  NA 116*   < > 119*   < > 122* 123*  K 4.6  --  4.6  --   --   --   CL 85*  --  92*  --   --   --   CO2 24  --  22  --   --   --   BUN 23  --  14  --   --   --   CREATININE 0.49  --  0.57  --   --   --   GLUCOSE 125*  --  104*  --   --   --   CALCIUM  8.3*  --  8.0*  --   --   --    < > = values in this interval not displayed.   No results for input(s): "LABPT", "INR" in the last 72 hours.   EXAM General - Patient is Alert, Appropriate, and Oriented Extremity - Neurovascular intact Incision: dressing C/D/I No cellulitis present Compartment soft Dressing/Incision - clean, dry, no drainage, honeycomb dressing intact to the right hip. Motor Function - intact, moving foot and toes well on exam.   Abdomen soft   Past Medical History:  Diagnosis Date   Chickenpox    Edema    feet/legs   Hyperglycemia    Hyperlipidemia    Hypertension    Osteopenia    PONV (postoperative nausea and vomiting)    after hand surgery    Assessment/Plan: 4 Days Post-Op Procedure(s) (LRB): FIXATION, FRACTURE, INTERTROCHANTERIC, WITH INTRAMEDULLARY ROD (Right) Principal Problem:   Closed intertrochanteric fracture of hip, right, initial encounter (HCC) Active Problems:   Chronic hyponatremia   Hypertensive urgency   Malnutrition of moderate degree   Acute postoperative anemia due to expected blood loss  Estimated body mass index is 24.75 kg/m as calculated from the following:   Height as of this encounter: 5\' 1"  (1.549 m).   Weight as of this encounter: 59.4 kg.  Labs and vitals reviewed. Na 123, being managed by internal medicine. Last Hg 8.3, continue to monitor N/V, Reglan  and Zofran  standing order. Monitor constipation with her taking narcotics. Continue with PT as tolerated, will likely need SNF.  DVT Prophylaxis -  Lovenox  and TED hose Weight-Bearing as tolerated to right leg Patient will follow up with [facility] in 2 weeks  Standley Earing, PA-C [facility] 07/13/2023, 11:48 AM

## 2023-07-13 NOTE — Consult Note (Signed)
 MEDICATION RELATED CONSULT NOTE  Pharmacy Consult for 3% NaCl Indication: Hyponatremia  Patient Measurements: Height: 5\' 1"  (154.9 cm) Weight: 59.4 kg (131 lb) IBW/kg (Calculated) : 47.8  Intake/Output from previous day: 04/18 0701 - 04/19 0700 In: 1156.1 [P.O.:480; I.V.:676.1] Out: 450 [Urine:450] Intake/Output from this shift: Total I/O In: -  Out: 210 [Urine:210]  Labs: Recent Labs    07/11/23 0536 07/12/23 0501  WBC 7.4  --   HGB 8.3*  --   HCT 22.8*  --   PLT 124*  --   CREATININE 0.49 0.57   Estimated Creatinine Clearance: 47.9 mL/min (by C-G formula based on SCr of 0.57 mg/dL).  Microbiology: No results found for this or any previous visit (from the past 720 hours).  Medical History: Past Medical History:  Diagnosis Date   Chickenpox    Edema    feet/legs   Hyperglycemia    Hyperlipidemia    Hypertension    Osteopenia    PONV (postoperative nausea and vomiting)    after hand surgery   Medications:  4/17 1312 3% NaCl at 30 cc/hr 4/16 NaCl 1g twice daily  Na Levels: 4/17 1036 = 115 Hypertonic saline started  4/17 1454 = 116  4/17 1653 = 118  4/17 2134 = 119 4/17 0049 = 120 4/18 0501 = 119 4/18 0930 = 121 4/18 1249 = 121 4/18 1636 = 125(3% NaCl stopped@1732 ) 4/18 2101 = 126 4/19 0121 = 125 4/19 0527 = 122 4/19 0911 = 123 4/19 1248 = 122 4/19 1657 = 123  Goal of Therapy:  Increase Na by 8 - 12 mmol in 24h  Plan:  --Continue NaCl tablets to 1g TID --Continue to hold 3% NaCl infusion --Pharmacy to assist in monitoring sodium per protocol while on hypertonic saline --Will notify provider if sodium rises > 4 mmol/L over 2 hours or > 6 mmol/L over 4 hours  Ramonita Burow, PharmD Clinical Pharmacist 07/13/2023 5:49 PM

## 2023-07-13 NOTE — Progress Notes (Signed)
 Progress Note   Patient: Shelly Armstrong ZOX:[id number] DOB: 1945/10/26 DOA: 07/08/2023     4 DOS: the patient was seen and examined on 07/13/2023   Brief hospital course: Taken from prior notes.  DENISE WASHBURN is a 78 y.o. female with medical history significant for HTN, HLD admitted with displaced right intertrochanteric femur fracture following mechanical fall.  Orthopedic surgery was consulted and patient was taken to the OR on/15/25  4/16: S/p ORIF.  Labs with sodium of 122, chloride 92, leukocytosis resolved, hemoglobin of 10.3 decreased from 11.7 on admission likely due to intraoperative blood loss.  Starting on supplement. Hyponatremia labs ordered.  Hyponatremia seems chronic patient was on HCTZ at home which was discontinued.  Slight decrease in sodium after getting normal saline.  A.m. cortisol low at 4, serum osmolality low at 253, pending urine studies.  Will repeat a.m. cortisol tomorrow and also starting on salt tablet.  4/17: Worsening sodium, came back 116 with a.m. lab, given 1 L bolus of normal saline by night on-call provider-repeat sodium even lower at 115 Patient was feeling more lethargic and weak-transferring to stepdown, nephrology consult and starting on hypertonic saline. Hemoglobin with another significant drop to 8.3, no obvious bleeding but obviously patient recently had a hip fracture and surgery. Repeat a.m. cortisol levels at 11.7  4/18: Vital stable, sodium at 119>>121-patient remained on hypertonic saline, urine studies with sodium less than 10 and decreased urine osmolality at 196. Anemia panel with no significant deficiency.  4/19: Sodium slowly started decreasing again, at 122, hypertonic saline was discontinued when it started going up more rapidly and was at 126. Salt tablets dose was increased and doing fluid restriction today.  Assessment and Plan: * Closed intertrochanteric fracture of hip, right, initial encounter (HCC) Accidental  fall Preoperative clearance was obtained S/p ORIF-tolerated the procedure well.  She will be weightbearing as tolerated per orthopedic surgery PT/OT are recommending SNF -Continue with supportive care and pain management  Hypertensive urgency Blood pressure now within goal.  It was significantly elevated on admission likely due to pain. -Continue home irbesartan  -Home HCTZ was discontinued due to hyponatremia  Chronic hyponatremia Acute on chronic hyponatremia. Seems chronic and slight worsening with normal saline.  Patient was also on HCTZ at home which is likely contributory.  Hyponatremia labs with slightly low serum and urine osmolality, urine sodium less than 10 TSH normal, a.m. repeat cortisol at 11.  Suspect SIADH Patient received hypertonic saline which was stopped yesterday evening when sodium reached up to 126 and trending up above the goal.  Sodium at 122 this morning -Nephrology increase the dose of salt tablet -Fluid restriction -Monitor sodium  Acute postoperative anemia due to expected blood loss Hemoglobin decreased to 8.3, patient had his recent hip surgery and a fracture.  No other obvious bleeding at this time. No obvious dietary deficiency on anemia panel. -Started on supplement -Continue to monitor  Malnutrition of moderate degree Dietitian consult      Subjective: Patient was seen and examined today.  She was quite worried that her sodium is started trending down again.  No nausea, vomiting or worsening weakness.  Physical Exam: Vitals:   07/13/23 0900 07/13/23 1000 07/13/23 1100 07/13/23 1200  BP: (!) 120/56 (!) 126/51 (!) 133/58 132/78  Pulse: 76 71 85 99  Resp: 13 10 16 18   Temp: (!) 97.4 F (36.3 C)     TempSrc: Oral     SpO2: 99% 100% 99% 100%  Weight:  Height:       General.  Frail and malnourished elderly lady, in no acute distress. Pulmonary.  Lungs clear bilaterally, normal respiratory effort. CV.  Regular rate and rhythm, no JVD, rub  or murmur. Abdomen.  Soft, nontender, nondistended, BS positive. CNS.  Alert and oriented .  No focal neurologic deficit. Extremities.  No edema, no cyanosis, pulses intact and symmetrical. Psychiatry.  Judgment and insight appears normal.   Data Reviewed: Prior data reviewed  Family Communication: Talked with daughter on phone.  Disposition: Status is: Inpatient Remains inpatient appropriate because: Severity of illness  Planned Discharge Destination: Skilled nursing facility  DVT prophylaxis.  Lovenox  Time spent: 45 minutes  This record has been created using Conservation officer, historic buildings. Errors have been sought and corrected,but may not always be located. Such creation errors do not reflect on the standard of care.   Author: Luna Salinas, MD 07/13/2023 1:34 PM  For on call review [email or web address].

## 2023-07-13 NOTE — Plan of Care (Signed)
  Problem: Health Behavior/Discharge Planning: Goal: Ability to manage health-related needs will improve Outcome: Progressing   Problem: Clinical Measurements: Goal: Ability to maintain clinical measurements within normal limits will improve Outcome: Progressing Goal: Respiratory complications will improve Outcome: Progressing   Problem: Nutrition: Goal: Adequate nutrition will be maintained Outcome: Progressing   Problem: Coping: Goal: Level of anxiety will decrease Outcome: Progressing

## 2023-07-14 ENCOUNTER — Inpatient Hospital Stay

## 2023-07-14 DIAGNOSIS — E440 Moderate protein-calorie malnutrition: Secondary | ICD-10-CM | POA: Diagnosis not present

## 2023-07-14 DIAGNOSIS — E871 Hypo-osmolality and hyponatremia: Secondary | ICD-10-CM | POA: Diagnosis not present

## 2023-07-14 DIAGNOSIS — I160 Hypertensive urgency: Secondary | ICD-10-CM | POA: Diagnosis not present

## 2023-07-14 DIAGNOSIS — S72141A Displaced intertrochanteric fracture of right femur, initial encounter for closed fracture: Secondary | ICD-10-CM | POA: Diagnosis not present

## 2023-07-14 LAB — CBC
HCT: 19.7 % — ABNORMAL LOW (ref 36.0–46.0)
Hemoglobin: 7.1 g/dL — ABNORMAL LOW (ref 12.0–15.0)
MCH: 34.5 pg — ABNORMAL HIGH (ref 26.0–34.0)
MCHC: 36 g/dL (ref 30.0–36.0)
MCV: 95.6 fL (ref 80.0–100.0)
Platelets: 172 10*3/uL (ref 150–400)
RBC: 2.06 MIL/uL — ABNORMAL LOW (ref 3.87–5.11)
RDW: 13.5 % (ref 11.5–15.5)
WBC: 5.8 10*3/uL (ref 4.0–10.5)
nRBC: 0 % (ref 0.0–0.2)

## 2023-07-14 LAB — PREPARE RBC (CROSSMATCH)

## 2023-07-14 LAB — SODIUM
Sodium: 124 mmol/L — ABNORMAL LOW (ref 135–145)
Sodium: 125 mmol/L — ABNORMAL LOW (ref 135–145)
Sodium: 127 mmol/L — ABNORMAL LOW (ref 135–145)

## 2023-07-14 LAB — HEMOGLOBIN AND HEMATOCRIT, BLOOD
HCT: 25.2 % — ABNORMAL LOW (ref 36.0–46.0)
Hemoglobin: 8.8 g/dL — ABNORMAL LOW (ref 12.0–15.0)

## 2023-07-14 LAB — BASIC METABOLIC PANEL WITH GFR
Anion gap: 3 — ABNORMAL LOW (ref 5–15)
BUN: 13 mg/dL (ref 8–23)
CO2: 27 mmol/L (ref 22–32)
Calcium: 8.3 mg/dL — ABNORMAL LOW (ref 8.9–10.3)
Chloride: 96 mmol/L — ABNORMAL LOW (ref 98–111)
Creatinine, Ser: 0.49 mg/dL (ref 0.44–1.00)
GFR, Estimated: >60 mL/min (ref >60)
Glucose, Bld: 105 mg/dL — ABNORMAL HIGH (ref 70–99)
Potassium: 4.6 mmol/L (ref 3.5–5.1)
Sodium: 126 mmol/L — ABNORMAL LOW (ref 135–145)

## 2023-07-14 LAB — ABO/RH: ABO/RH(D): O POS

## 2023-07-14 MED ORDER — POLYETHYLENE GLYCOL 3350 17 G PO PACK
17.0000 g | PACK | Freq: Two times a day (BID) | ORAL | Status: DC
  Administered 2023-07-14 – 2023-07-16 (×4): 17 g via ORAL
  Filled 2023-07-14 (×3): qty 1

## 2023-07-14 MED ORDER — SMOG ENEMA
960.0000 mL | Freq: Once | RECTAL | Status: DC
  Filled 2023-07-14: qty 960

## 2023-07-14 MED ORDER — CARVEDILOL 6.25 MG PO TABS: 6.2500 mg | ORAL_TABLET | Freq: Two times a day (BID) | ORAL | Status: DC

## 2023-07-14 MED ORDER — CARVEDILOL 3.125 MG PO TABS
6.2500 mg | ORAL_TABLET | Freq: Two times a day (BID) | ORAL | Status: DC
  Administered 2023-07-14 – 2023-07-16 (×4): 6.25 mg via ORAL
  Filled 2023-07-14: qty 2
  Filled 2023-07-14: qty 1
  Filled 2023-07-14: qty 2
  Filled 2023-07-14: qty 1

## 2023-07-14 MED ORDER — MORPHINE SULFATE (PF) 2 MG/ML IV SOLN
2.0000 mg | Freq: Once | INTRAVENOUS | Status: AC
  Administered 2023-07-14: 2 mg via INTRAVENOUS
  Filled 2023-07-14: qty 1

## 2023-07-14 MED ORDER — SODIUM CHLORIDE 0.9% IV SOLUTION: Freq: Once | INTRAVENOUS | Status: AC

## 2023-07-14 MED ORDER — LABETALOL HCL 5 MG/ML IV SOLN: 5.0000 mg | INTRAVENOUS | Status: DC | PRN

## 2023-07-14 MED ORDER — LACTULOSE 10 GM/15ML PO SOLN
30.0000 g | Freq: Two times a day (BID) | ORAL | Status: DC | PRN
  Filled 2023-07-14: qty 60

## 2023-07-14 MED ORDER — FLEET ENEMA RE ENEM
1.0000 | ENEMA | Freq: Every day | RECTAL | Status: DC | PRN
  Administered 2023-07-14: 1 via RECTAL

## 2023-07-14 NOTE — Consult Note (Signed)
 MEDICATION RELATED CONSULT NOTE  Pharmacy Consult for 3% NaCl Indication: Hyponatremia  Patient Measurements: Height: 5\' 1"  (154.9 cm) Weight: 59.4 kg (131 lb) IBW/kg (Calculated) : 47.8  Intake/Output from previous day: 04/19 0701 - 04/20 0700 In: 120 [P.O.:120] Out: 1010 [Urine:1010] Intake/Output from this shift: Total I/O In: 304 [Blood:304] Out: 500 [Urine:500]  Labs: Recent Labs    07/12/23 0501 07/14/23 0518  WBC  --  5.8  HGB  --  7.1*  HCT  --  19.7*  PLT  --  172  CREATININE 0.57 0.49   Estimated Creatinine Clearance: 47.9 mL/min (by C-G formula based on SCr of 0.49 mg/dL).  Microbiology: No results found for this or any previous visit (from the past 720 hours).  Medical History: Past Medical History:  Diagnosis Date   Chickenpox    Edema    feet/legs   Hyperglycemia    Hyperlipidemia    Hypertension    Osteopenia    PONV (postoperative nausea and vomiting)    after hand surgery   Medications:  4/17 1312 3% NaCl at 30 cc/hr 4/16 NaCl 1g twice daily  Na Levels: 4/17 1036 = 115 Hypertonic saline started  4/17 1454 = 116  4/17 1653 = 118  4/17 2134 = 119 4/17 0049 = 120 4/18 0501 = 119 4/18 0930 = 121 4/18 1249 = 121 4/18 1636 = 125(3% NaCl stopped@1732 ) 4/18 2101 = 126 4/19 0121 = 125 4/19 0527 = 122 4/19 0911 = 123 4/19 1248 = 122 4/19 1657 = 123 4/19 2042 = 124 4/20 0053 = 124 4/20 0518 = 126 4/20 0845 = 125 4/20 1301 = 127  Goal of Therapy:  Increase Na by 8 - 12 mmol in 24h  Plan:  --Continue NaCl tablets to 1g TID --Continue to hold 3% NaCl infusion --Pharmacy to assist in monitoring sodium per protocol while on hypertonic saline --Will notify provider if sodium rises > 4 mmol/L over 2 hours or > 6 mmol/L over 4 hours  Carol Loftin A Mikhaela Zaugg, PharmD Clinical Pharmacist 07/14/2023 1:33 PM

## 2023-07-14 NOTE — Plan of Care (Signed)
  Problem: Health Behavior/Discharge Planning: Goal: Ability to manage health-related needs will improve Outcome: Progressing   Problem: Clinical Measurements: Goal: Ability to maintain clinical measurements within normal limits will improve Outcome: Progressing Goal: Will remain free from infection Outcome: Progressing Goal: Diagnostic test results will improve Outcome: Progressing Goal: Cardiovascular complication will be avoided Outcome: Progressing   Problem: Nutrition: Goal: Adequate nutrition will be maintained Outcome: Progressing   Problem: Coping: Goal: Level of anxiety will decrease Outcome: Progressing   Problem: Pain Managment: Goal: General experience of comfort will improve and/or be controlled Outcome: Progressing

## 2023-07-14 NOTE — Progress Notes (Signed)
 [facility]  ROUNDING NOTE   Subjective:   Sodium 126  Sister at bedside.   Objective:  Vital signs in last 24 hours:  Temp:  [97.4 F (36.3 C)-98.4 F (36.9 C)] 98.4 F (36.9 C) (04/20 0800) Pulse Rate:  [71-101] 73 (04/20 0800) Resp:  [9-19] 9 (04/20 0800) BP: (109-163)/(48-78) 144/57 (04/20 0800) SpO2:  [96 %-100 %] 99 % (04/20 0800)  Weight change:  Filed Weights   07/08/23 2149  Weight: 59.4 kg    Intake/Output: I/O last 3 completed shifts: In: 360 [P.O.:360] Out: 1460 [Urine:1460]   Intake/Output this shift:  No intake/output data recorded.  Physical Exam: General: NAD, laying in bed  Head: Normocephalic, atraumatic. Moist oral mucosal membranes  Eyes: Anicteric  Lungs:  Clear to auscultation  Heart: Regular rate and rhythm  Abdomen:  Soft, nontender,   Extremities:  No peripheral edema.  Neurologic: Alert, moving all four extremities  Skin: No lesions       Basic Metabolic Panel: Recent Labs  Lab 07/09/23 2213 07/10/23 0417 07/11/23 0536 07/11/23 1036 07/12/23 0501 07/12/23 0930 07/13/23 1248 07/13/23 1657 07/13/23 2042 07/14/23 0053 07/14/23 0518  NA 123* 122* 116*   < > 119*   < > 122* 123* 124* 124* 126*  K 3.8 3.9 4.6  --  4.6  --   --   --   --   --  4.6  CL 92* 92* 85*  --  92*  --   --   --   --   --  96*  CO2 24 23 24   --  22  --   --   --   --   --  27  GLUCOSE 221* 154* 125*  --  104*  --   --   --   --   --  105*  BUN 14 16 23   --  14  --   --   --   --   --  13  CREATININE 0.49 0.52 0.49  --  0.57  --   --   --   --   --  0.49  CALCIUM  8.6* 8.6* 8.3*  --  8.0*  --   --   --   --   --  8.3*   < > = values in this interval not displayed.    Liver Function Tests: Recent Labs  Lab 07/10/23 0417  AST 30  ALT 24  ALKPHOS 44  BILITOT 0.6  PROT 5.3*  ALBUMIN 3.3*   No results for input(s): "LIPASE", "AMYLASE" in the last 168 hours. No results for input(s): "AMMONIA" in the last 168 hours.  CBC: Recent Labs   Lab 07/08/23 2348 07/09/23 2213 07/10/23 0417 07/11/23 0536 07/14/23 0518  WBC 6.2 11.3* 9.2 7.4 5.8  HGB 11.7* 11.6* 10.3* 8.3* 7.1*  HCT 32.7* 31.5* 28.4* 22.8* 19.7*  MCV 91.9 92.1 91.9 91.2 95.6  PLT 196 149* 154 124* 172    Cardiac Enzymes: No results for input(s): "CKTOTAL", "CKMB", "CKMBINDEX", "TROPONINI" in the last 168 hours.  BNP: Invalid input(s): "POCBNP"  CBG: Recent Labs  Lab 07/11/23 1229  GLUCAP 147*    Microbiology: No results found for this or any previous visit.  Coagulation Studies: No results for input(s): "LABPROT", "INR" in the last 72 hours.  Urinalysis: No results for input(s): "COLORURINE", "LABSPEC", "PHURINE", "GLUCOSEU", "HGBUR", "BILIRUBINUR", "KETONESUR", "PROTEINUR", "UROBILINOGEN", "NITRITE", "LEUKOCYTESUR" in the last 72 hours.  Invalid input(s): "APPERANCEUR"    Imaging: No  results found.   Medications:    sodium chloride  (hypertonic) Stopped (07/12/23 1730)    sodium chloride    Intravenous Once   aspirin  EC  81 mg Oral Daily   atorvastatin   40 mg Oral Daily   Chlorhexidine  Gluconate Cloth  6 each Topical Daily   enoxaparin  (LOVENOX ) injection  40 mg Subcutaneous Q24H   Fe Fum-Vit C-Vit B12-FA  1 capsule Oral BID   feeding supplement  237 mL Oral BID BM   irbesartan   300 mg Oral Daily   multivitamin with minerals  1 tablet Oral Daily   pantoprazole   40 mg Oral Daily   polyethylene glycol  17 g Oral Daily   senna  2 tablet Oral Daily   sodium chloride   1 g Oral TID WC   acetaminophen , HYDROcodone -acetaminophen , menthol -cetylpyridinium **OR** phenol, methocarbamol  **OR** methocarbamol  (ROBAXIN ) injection, metoCLOPramide  **OR** metoCLOPramide  (REGLAN ) injection, ondansetron  (ZOFRAN ) IV, mouth rinse  Assessment/ Plan:  Shelly Armstrong is a 78 y.o.  female with medical problems of  hypertension, hyperlipidemia presented for low back pain, chronic constipation.  She had an accidental fall which resulted in right  intertrochanteric fracture.  She was admitted on 07/08/2023 for Hip fracture (HCC) [S72.009A] Hyponatremia [E87.1] Right hip pain [M25.551] Fall, initial encounter [W19.XXXA] Closed right hip fracture, initial encounter (HCC) [S72.001A] Pain in right femur [M89.8X5]   1.  Acute on chronic hyponatremia Patient appears to be euvolemic.  Serum osmolality at 253. Normal TSH 07/09/2023. A.m. cortisol was low on 416 but improved on 07/11/2023. - no indication hypertonic infusion.  - continue fluid restriction - Continue salt tablets - pending SPEP     2.  Right intertrochanteric fracture Patient underwent right hip intramedullary nail placement on 07/09/2023 by Dr. Clyda Dark.       3.  Anemia: NOS: hemoglobin 7.1      LOS: 5 Clarissa Laird 4/20/20258:43 AM

## 2023-07-14 NOTE — Progress Notes (Signed)
 Progress Note   Patient: Shelly Armstrong XBM:[id number] DOB: 04-11-1945 DOA: 07/08/2023     5 DOS: the patient was seen and examined on 07/14/2023   Brief hospital course: Taken from prior notes.  Shelly Armstrong is a 78 y.o. female with medical history significant for HTN, HLD admitted with displaced right intertrochanteric femur fracture following mechanical fall.  Orthopedic surgery was consulted and patient was taken to the OR on/15/25  4/16: S/p ORIF.  Labs with sodium of 122, chloride 92, leukocytosis resolved, hemoglobin of 10.3 decreased from 11.7 on admission likely due to intraoperative blood loss.  Starting on supplement. Hyponatremia labs ordered.  Hyponatremia seems chronic patient was on HCTZ at home which was discontinued.  Slight decrease in sodium after getting normal saline.  A.m. cortisol low at 4, serum osmolality low at 253, pending urine studies.  Will repeat a.m. cortisol tomorrow and also starting on salt tablet.  4/17: Worsening sodium, came back 116 with a.m. lab, given 1 L bolus of normal saline by night on-call provider-repeat sodium even lower at 115 Patient was feeling more lethargic and weak-transferring to stepdown, nephrology consult and starting on hypertonic saline. Hemoglobin with another significant drop to 8.3, no obvious bleeding but obviously patient recently had a hip fracture and surgery. Repeat a.m. cortisol levels at 11.7  4/18: Vital stable, sodium at 119>>121-patient remained on hypertonic saline, urine studies with sodium less than 10 and decreased urine osmolality at 196. Anemia panel with no significant deficiency.  4/19: Sodium slowly started decreasing again, at 122, hypertonic saline was discontinued when it started going up more rapidly and was at 126. Salt tablets dose was increased and doing fluid restriction today.  4/20: Vitals stable, slowly improving sodium currently at 126, hemoglobin with another decrease to 7.1, recent anemia  panel was without any significant deficiency patient was on supplement.  No obvious bleeding. Protein electrophoresis and kappa lab data labs are pending. Ordered 1 unit of PRBC. Persistent constipation-KUB was without any concern of obstruction, ordered more aggressive bowel regimen.  Assessment and Plan: * Closed intertrochanteric fracture of hip, right, initial encounter (HCC) Accidental fall Preoperative clearance was obtained S/p ORIF-tolerated the procedure well.  She will be weightbearing as tolerated per orthopedic surgery PT/OT are recommending SNF -Continue with supportive care and pain management  Hypertensive urgency Blood pressure now within goal.  It was significantly elevated on admission likely due to pain. -Continue home irbesartan  -Home HCTZ was discontinued due to hyponatremia  Chronic hyponatremia Acute on chronic hyponatremia. Seems chronic and slight worsening with normal saline.  Patient was also on HCTZ at home which is likely contributory.  Hyponatremia labs with slightly low serum and urine osmolality, urine sodium less than 10 TSH normal, a.m. repeat cortisol at 11.  Suspect SIADH Patient received hypertonic saline which was stopped  when sodium reached up to 126 and trending up above the goal.  Sodium at 126 this morning -Nephrology increase the dose of salt tablet -Fluid restriction -Monitor sodium  Acute postoperative anemia due to expected blood loss Hemoglobin decreased to 7.1, patient had his recent hip surgery and a fracture.  No other obvious bleeding at this time. No obvious dietary deficiency on anemia panel. -Ordered 1 unit of PRBC -Started on supplement -Continue to monitor  Constipation Patient with persistent constipation causing nausea and poor p.o. intake now.  History of chronic constipation. Simple MiraLAX  was not working.  KUB done today was negative for any concern of bowel obstruction. - More extensive  bowel regimen including p.o.  and enema  Malnutrition of moderate degree Dietitian consult      Subjective: Patient was having some nausea and poor appetite when seen today.  Did not had any bowel movement.  Per sister at bedside patient has history of chronic constipation.  Physical Exam: Vitals:   07/14/23 1200 07/14/23 1233 07/14/23 1300 07/14/23 1400  BP: (!) 166/71 (!) 160/63 133/64 (!) 150/75  Pulse: 83 82 82 80  Resp: 18 16 17  (!) 23  Temp: 97.6 F (36.4 C) 98.1 F (36.7 C)    TempSrc: Axillary Oral    SpO2: 98% 99% 97% 99%  Weight:      Height:       General.  Frail elderly lady, in no acute distress. Pulmonary.  Lungs clear bilaterally, normal respiratory effort. CV.  Regular rate and rhythm, no JVD, rub or murmur. Abdomen.  Soft, nontender, nondistended, BS positive. CNS.  Alert and oriented .  No focal neurologic deficit. Extremities.  No edema, no cyanosis, pulses intact and symmetrical. Psychiatry.  Judgment and insight appears normal.    Data Reviewed: Prior data reviewed  Family Communication: Discussed with sister at bedside  Disposition: Status is: Inpatient Remains inpatient appropriate because: Severity of illness  Planned Discharge Destination: Skilled nursing facility  DVT prophylaxis.  Lovenox  Time spent: 44 minutes  This record has been created using Conservation officer, historic buildings. Errors have been sought and corrected,but may not always be located. Such creation errors do not reflect on the standard of care.   Author: Luna Salinas, MD 07/14/2023 2:49 PM  For on call review [email or web address].

## 2023-07-14 NOTE — Progress Notes (Signed)
 Physical Therapy Treatment Patient Details Name: Shelly Armstrong MRN: [id number] DOB: [birth year] Today's Date: 07/14/2023   History of Present Illness presented to ER status post mechanical fall in home environment, acute onset of R hip pain; admitted for management of closed intertrochanteric hip fracture, s/p IM nailing (07/09/23), WBAT. transfer to ICU for management of low sodium    PT Comments  Seen this pm after blood transfusion.  She initially declined due to malaise but while talking has large loose BM.  Rolling/left right with mod a x 63for care assisted by tech.  Once completed she agrees to RLE AAROM and sitting EOB.  Encouraged pt to try BSC due to cramping and transfers slowly with min a x 1.  She has an additional large liquid BM and is able to stand for care then swap out of BSC to recliner.  She does report some dizziness upon sitting but is relieved with time, BP stable.  Overall tolerates well given feeling poorly and amount of BM passed.   If plan is discharge home, recommend the following: A little help with walking and/or transfers;A lot of help with bathing/dressing/bathroom;Assistance with cooking/housework;Direct supervision/assist for medications management;Direct supervision/assist for financial management;Assist for transportation;Help with stairs or ramp for entrance   Can travel by private vehicle        Equipment Recommendations  Rolling walker (2 wheels);BSC/3in1    Recommendations for Other Services       Precautions / Restrictions Precautions Precautions: Fall Recall of Precautions/Restrictions: Intact Restrictions Weight Bearing Restrictions Per Provider Order: Yes RLE Weight Bearing Per Provider Order: Weight bearing as tolerated     Mobility  Bed Mobility Overal bed mobility: Needs Assistance Bed Mobility: Supine to Sit     Supine to sit: Mod assist       Patient Response: Cooperative  Transfers Overall transfer level: Needs  assistance Equipment used: Rolling walker (2 wheels) Transfers: Sit to/from Stand, Bed to chair/wheelchair/BSC Sit to Stand: Min assist, Contact guard assist   Step pivot transfers: Min assist            Ambulation/Gait Ambulation/Gait assistance: Min assist Gait Distance (Feet): 3 Feet Assistive device: Rolling walker (2 wheels) Gait Pattern/deviations: Step-to pattern, Antalgic Gait velocity: decreased     General Gait Details: slow steps to chair limited by need to use BSC and some dizziness   Stairs             Wheelchair Mobility     Tilt Bed Tilt Bed Patient Response: Cooperative  Modified Rankin (Stroke Patients Only)       Balance Overall balance assessment: Needs assistance Sitting-balance support: No upper extremity supported, Feet supported Sitting balance-Leahy Scale: Good     Standing balance support: Bilateral upper extremity supported Standing balance-Leahy Scale: Fair Standing balance comment: external support required for standing                            Communication Communication Communication: No apparent difficulties  Cognition Arousal: Alert Behavior During Therapy: WFL for tasks assessed/performed   PT - Cognitive impairments: No apparent impairments                         Following commands: Intact      Cueing Cueing Techniques: Verbal cues  Exercises Other Exercises Other Exercises: RLE AAROM in supine Other Exercises: rolling left/rgith for BM care in bed, transfer to [facility] to complete BM  General Comments        Pertinent Vitals/Pain Pain Assessment Pain Assessment: Faces Faces Pain Scale: Hurts even more Pain Location: R hip Pain Descriptors / Indicators: Aching, Discomfort Pain Intervention(s): Limited activity within patient's tolerance, Monitored during session, Repositioned    Home Living                          Prior Function            PT Goals (current  goals can now be found in the care plan section) Progress towards PT goals: Progressing toward goals    Frequency    7X/week      PT Plan      Co-evaluation              AM-PAC PT "6 Clicks" Mobility   Outcome Measure  Help needed turning from your back to your side while in a flat bed without using bedrails?: A Little Help needed moving from lying on your back to sitting on the side of a flat bed without using bedrails?: A Lot Help needed moving to and from a bed to a chair (including a wheelchair)?: A Little Help needed standing up from a chair using your arms (e.g., wheelchair or bedside chair)?: A Little Help needed to walk in hospital room?: A Little Help needed climbing 3-5 steps with a railing? : A Lot 6 Click Score: 16    End of Session Equipment Utilized During Treatment: Gait belt Activity Tolerance: Patient tolerated treatment well Patient left: in chair;with call bell/phone within reach Nurse Communication: Mobility status PT Visit Diagnosis: Muscle weakness (generalized) (M62.81);Difficulty in walking, not elsewhere classified (R26.2);Pain Pain - Right/Left: Right Pain - part of body: Hip     Time: 1400-1436 PT Time Calculation (min) (ACUTE ONLY): 36 min  Charges:    $Therapeutic Exercise: 8-22 mins $Therapeutic Activity: 8-22 mins PT General Charges $$ ACUTE PT VISIT: 1 Visit                   Charlanne Cong, PTA 07/14/23, 2:43 PM

## 2023-07-14 NOTE — Progress Notes (Addendum)
 Subjective: 5 Days Post-Op Procedure(s) (LRB): FIXATION, FRACTURE, INTERTROCHANTERIC, WITH INTRAMEDULLARY ROD (Right) Patient reports pain as severe in the left hip this AM..   Patient is  reports she is still having some nausea. Did have a small BM last night. PT and care management to assist with discharge planning. Negative for chest pain and shortness of breath Fever: no Gastrointestinal: Positive for nausea  Objective: Vital signs in last 24 hours: Temp:  [97.9 F (36.6 C)-98.4 F (36.9 C)] 98.4 F (36.9 C) (04/20 0800) Pulse Rate:  [71-101] 73 (04/20 0800) Resp:  [9-19] 9 (04/20 0800) BP: (109-163)/(48-78) 144/57 (04/20 0800) SpO2:  [96 %-100 %] 99 % (04/20 0800)  Intake/Output from previous day:  Intake/Output Summary (Last 24 hours) at 07/14/2023 0915 Last data filed at 07/14/2023 0600 Gross per 24 hour  Intake 120 ml  Output 1000 ml  Net -880 ml    Intake/Output this shift: No intake/output data recorded.  Labs: Recent Labs    07/14/23 0518  HGB 7.1*   Recent Labs    07/14/23 0518  WBC 5.8  RBC 2.06*  HCT 19.7*  PLT 172   Recent Labs    07/12/23 0501 07/12/23 0930 07/14/23 0518 07/14/23 0845  NA 119*   < > 126* 125*  K 4.6  --  4.6  --   CL 92*  --  96*  --   CO2 22  --  27  --   BUN 14  --  13  --   CREATININE 0.57  --  0.49  --   GLUCOSE 104*  --  105*  --   CALCIUM  8.0*  --  8.3*  --    < > = values in this interval not displayed.   No results for input(s): "LABPT", "INR" in the last 72 hours.   EXAM General - Patient is Alert, Appropriate, and Oriented Extremity - Neurovascular intact Incision: dressing C/D/I No cellulitis present Compartment soft Dressing/Incision - clean, dry, no drainage, honeycomb dressing intact to the right hip. Motor Function - intact, moving foot and toes well on exam.  Abdomen soft   Past Medical History:  Diagnosis Date   Chickenpox    Edema    feet/legs   Hyperglycemia    Hyperlipidemia     Hypertension    Osteopenia    PONV (postoperative nausea and vomiting)    after hand surgery    Assessment/Plan: 5 Days Post-Op Procedure(s) (LRB): FIXATION, FRACTURE, INTERTROCHANTERIC, WITH INTRAMEDULLARY ROD (Right) Principal Problem:   Closed intertrochanteric fracture of hip, right, initial encounter (HCC) Active Problems:   Chronic hyponatremia   Hypertensive urgency   Malnutrition of moderate degree   Acute postoperative anemia due to expected blood loss  Estimated body mass index is 24.75 kg/m as calculated from the following:   Height as of this encounter: 5\' 1"  (1.549 m).   Weight as of this encounter: 59.4 kg.  Up with therapy Labs and vitals reviewed. Na 125, being managed by internal medicine. Last Hg 7.1, pRBC ordered by internal medicine, continue to trend N/V, Reglan  and Zofran  standing order. Monitor constipation with her taking narcotics. Still reporting increased stomach discomfort, may be worth attaining ABD CT, will defer to medicine service Continue with PT as tolerated, will likely need SNF.  DVT Prophylaxis - Lovenox  and TED hose Weight-Bearing as tolerated to right leg Patient will follow up with [facility] in 2 weeks postoperatively  Standley Earing, PA-C [facility] 07/14/2023, 9:15 AM

## 2023-07-14 NOTE — Plan of Care (Signed)
  Problem: Clinical Measurements: Goal: Ability to maintain clinical measurements within normal limits will improve Outcome: Progressing Goal: Will remain free from infection Outcome: Progressing Goal: Diagnostic test results will improve Outcome: Progressing Goal: Respiratory complications will improve Outcome: Progressing Goal: Cardiovascular complication will be avoided Outcome: Progressing   Problem: Activity: Goal: Risk for activity intolerance will decrease Outcome: Not Progressing

## 2023-07-14 NOTE — Assessment & Plan Note (Addendum)
 Resolved with extensive bowel regimen, had 2 large BMs Patient with persistent constipation causing nausea and poor p.o. intake now.  History of chronic constipation. Simple MiraLAX  was not working.  KUB done today was negative for any concern of bowel obstruction. - Continue with bowel regimen

## 2023-07-15 DIAGNOSIS — S72141A Displaced intertrochanteric fracture of right femur, initial encounter for closed fracture: Secondary | ICD-10-CM | POA: Diagnosis not present

## 2023-07-15 DIAGNOSIS — E871 Hypo-osmolality and hyponatremia: Secondary | ICD-10-CM | POA: Diagnosis not present

## 2023-07-15 DIAGNOSIS — I160 Hypertensive urgency: Secondary | ICD-10-CM | POA: Diagnosis not present

## 2023-07-15 DIAGNOSIS — E440 Moderate protein-calorie malnutrition: Secondary | ICD-10-CM | POA: Diagnosis not present

## 2023-07-15 LAB — BASIC METABOLIC PANEL WITH GFR
Anion gap: 6 (ref 5–15)
BUN: 12 mg/dL (ref 8–23)
CO2: 27 mmol/L (ref 22–32)
Calcium: 8.2 mg/dL — ABNORMAL LOW (ref 8.9–10.3)
Chloride: 96 mmol/L — ABNORMAL LOW (ref 98–111)
Creatinine, Ser: 0.47 mg/dL (ref 0.44–1.00)
GFR, Estimated: >60 mL/min (ref >60)
Glucose, Bld: 93 mg/dL (ref 70–99)
Potassium: 4.3 mmol/L (ref 3.5–5.1)
Sodium: 129 mmol/L — ABNORMAL LOW (ref 135–145)

## 2023-07-15 LAB — TYPE AND SCREEN
ABO/RH(D): O POS
Antibody Screen: NEGATIVE
Unit division: 0

## 2023-07-15 LAB — CBC
HCT: 23.4 % — ABNORMAL LOW (ref 36.0–46.0)
Hemoglobin: 8.3 g/dL — ABNORMAL LOW (ref 12.0–15.0)
MCH: 33.2 pg (ref 26.0–34.0)
MCHC: 35.5 g/dL (ref 30.0–36.0)
MCV: 93.6 fL (ref 80.0–100.0)
Platelets: 177 10*3/uL (ref 150–400)
RBC: 2.5 MIL/uL — ABNORMAL LOW (ref 3.87–5.11)
RDW: 15.2 % (ref 11.5–15.5)
WBC: 5.2 10*3/uL (ref 4.0–10.5)
nRBC: 0 % (ref 0.0–0.2)

## 2023-07-15 LAB — PROTEIN ELECTROPHORESIS, SERUM
A/G Ratio: 1.6 (ref 0.7–1.7)
Albumin ELP: 2.9 g/dL (ref 2.9–4.4)
Alpha-1-Globulin: 0.3 g/dL (ref 0.0–0.4)
Alpha-2-Globulin: 0.5 g/dL (ref 0.4–1.0)
Beta Globulin: 0.6 g/dL — ABNORMAL LOW (ref 0.7–1.3)
Gamma Globulin: 0.4 g/dL (ref 0.4–1.8)
Globulin, Total: 1.8 g/dL — ABNORMAL LOW (ref 2.2–3.9)
Total Protein ELP: 4.7 g/dL — ABNORMAL LOW (ref 6.0–8.5)

## 2023-07-15 LAB — BPAM RBC
Blood Product Expiration Date: 202505222359
ISSUE DATE / TIME: 202504200958
Unit Type and Rh: 5100

## 2023-07-15 LAB — KAPPA/LAMBDA LIGHT CHAINS
Kappa free light chain: 11.6 mg/L (ref 3.3–19.4)
Kappa, lambda light chain ratio: 1.1 (ref 0.26–1.65)
Lambda free light chains: 10.5 mg/L (ref 5.7–26.3)

## 2023-07-15 NOTE — TOC Progression Note (Signed)
 Transition of Care [facility]) - Progression Note    Patient Details  Name: Shelly Armstrong MRN: [id number] Date of Birth: April 13, 1945  [facility] CM/SW Contact  Crayton Docker, RN 07/15/2023, 2:52 PM  Clinical Narrative:     CM to patient's room regarding SNF preferences. Noted, accepting SNF offers from [facility], [facility], [facility] and [facility]. Patient states prefers [facility]. CM call to [facility] regarding bed offer. Per Antony Baumgartner, SNF is extending bed offer and can accept patient for tomorrow. CM follow up call to [facility]. CM spoke to [place] regarding auth approval [facility] [employer] has not come back yet.    Expected Discharge Plan and Services    SNF     Social Determinants of Health (SDOH) Interventions SDOH Screenings   Food Insecurity: No Food Insecurity (07/11/2023)  Housing: Unknown (07/11/2023)  Transportation Needs: No Transportation Needs (07/11/2023)  Utilities: Not At Risk (07/11/2023)  Financial Resource Strain: Low Risk  (07/04/2023)   Received from [facility]  Social Connections: Moderately Isolated (07/11/2023)  Tobacco Use: Low Risk  (07/09/2023)    Readmission Risk Interventions     No data to display

## 2023-07-15 NOTE — Progress Notes (Signed)
 Physical Therapy Treatment Patient Details Name: Shelly Armstrong MRN: [id number] DOB: 04/29/1945 Today's Date: 07/15/2023   History of Present Illness presented to ER status post mechanical fall in home environment, acute onset of R hip pain; admitted for management of closed intertrochanteric hip fracture, s/p IM nailing (07/09/23), WBAT. transfer to ICU for management of low sodium    PT Comments  Pt ready for session.  Finished breakfast at EOB.  Stood with min a x 1 and transferred to recliner with slow but steady steps.  Pre-gait weight shifting and standing AROM for R LE.  After short seated rest, she is able to progress gait 30' with cga/min a x 1 and recliner follow by daughter.  Remained in chair after session.   If plan is discharge home, recommend the following: A little help with walking and/or transfers;Assistance with cooking/housework;Direct supervision/assist for medications management;Direct supervision/assist for financial management;Assist for transportation;Help with stairs or ramp for entrance;A little help with bathing/dressing/bathroom   Can travel by private vehicle        Equipment Recommendations  Rolling walker (2 wheels);BSC/3in1    Recommendations for Other Services       Precautions / Restrictions Precautions Precautions: Fall Recall of Precautions/Restrictions: Intact Restrictions Weight Bearing Restrictions Per Provider Order: Yes RLE Weight Bearing Per Provider Order: Weight bearing as tolerated     Mobility  Bed Mobility               General bed mobility comments: sitting EOB eating breakfast upon arrival.  daughter in room. Patient Response: Cooperative  Transfers Overall transfer level: Needs assistance Equipment used: Rolling walker (2 wheels) Transfers: Sit to/from Stand Sit to Stand: Min assist                Ambulation/Gait Ambulation/Gait assistance: Min assist, Contact guard assist Gait Distance (Feet): 30  Feet Assistive device: Rolling walker (2 wheels) Gait Pattern/deviations: Step-through pattern, Decreased step length - right, Decreased step length - left Gait velocity: decreased     General Gait Details: limited by fatigue   Stairs             Wheelchair Mobility     Tilt Bed Tilt Bed Patient Response: Cooperative  Modified Rankin (Stroke Patients Only)       Balance Overall balance assessment: Needs assistance Sitting-balance support: No upper extremity supported, Feet supported Sitting balance-Leahy Scale: Good     Standing balance support: Bilateral upper extremity supported Standing balance-Leahy Scale: Fair Standing balance comment: external support required for standing                            Communication Communication Communication: No apparent difficulties  Cognition Arousal: Alert Behavior During Therapy: WFL for tasks assessed/performed   PT - Cognitive impairments: No apparent impairments                         Following commands: Intact      Cueing Cueing Techniques: Verbal cues  Exercises      General Comments        Pertinent Vitals/Pain Pain Assessment Pain Assessment: Faces Faces Pain Scale: Hurts a little bit Pain Location: R hip Pain Descriptors / Indicators: Aching, Discomfort Pain Intervention(s): Limited activity within patient's tolerance, Monitored during session, Repositioned    Home Living  Prior Function            PT Goals (current goals can now be found in the care plan section) Progress towards PT goals: Progressing toward goals    Frequency    7X/week      PT Plan      Co-evaluation              AM-PAC PT "6 Clicks" Mobility   Outcome Measure  Help needed turning from your back to your side while in a flat bed without using bedrails?: A Little Help needed moving from lying on your back to sitting on the side of a flat bed  without using bedrails?: A Little Help needed moving to and from a bed to a chair (including a wheelchair)?: A Little Help needed standing up from a chair using your arms (e.g., wheelchair or bedside chair)?: A Little Help needed to walk in hospital room?: A Little Help needed climbing 3-5 steps with a railing? : A Lot 6 Click Score: 17    End of Session Equipment Utilized During Treatment: Gait belt Activity Tolerance: Patient tolerated treatment well Patient left: in chair;with call bell/phone within reach;with family/visitor present Nurse Communication: Mobility status PT Visit Diagnosis: Muscle weakness (generalized) (M62.81);Difficulty in walking, not elsewhere classified (R26.2);Pain Pain - Right/Left: Right Pain - part of body: Hip     Time: 1610-9604 PT Time Calculation (min) (ACUTE ONLY): 21 min  Charges:    $Gait Training: 8-22 mins PT General Charges $$ ACUTE PT VISIT: 1 Visit                    Charlanne Cong, PTA 07/15/23, 9:31 AM

## 2023-07-15 NOTE — Progress Notes (Signed)
 Nutrition Follow-up  DOCUMENTATION CODES:   Non-severe (moderate) malnutrition in context of chronic illness  INTERVENTION:   Ensure Enlive po BID, each supplement provides 350 kcal and 20 grams of protein.  MVI po daily   Daily weights   Referral sent to [facility] for outpatient nutrition education  NUTRITION DIAGNOSIS:   Moderate Malnutrition related to social / environmental circumstances as evidenced by mild fat depletion, mild muscle depletion, moderate muscle depletion, percent weight loss. -ongoing   GOAL:   Patient will meet greater than or equal to 90% of their needs -progressing   MONITOR:   PO intake, Supplement acceptance, Labs, Weight trends, I & O's, Skin  ASSESSMENT:   78 y/o with h/o HLD, HTN, chronic constipation, back pain, hyponatremia and GERD who is admitted with hip fracture after fall s/p intramedullary nailing 4/15 complicated by hyponatremia.  Met with pt in room today. Pt reports intermittent poor appetite and oral intake in hospital. Pt reports that she has been struggling with constipation; pt reports that she was having issues with constipation pta and had requested to be referred to a dietitian. Pt reports that she had a good bowel movement yesterday and she feels better today. Pt reports some nausea this morning but reports this improved after she ate breakfast. Pt reports eating a pancake and sausage patty for breakfast this morning. Pt also reports drinking an Ensure. RD discussed with pt the importance of adequate nutrition needed to preserve lean muscle. Pt reports that she is drinking two Ensure per day. Hypernatremia is improving. No new weight since admit; will request daily weights. Pt appears similar on exam today from last week. Referral sent to [facility] per pt's request.    Medications reviewed and include: aspirin , lovenox , fe fum-vit-c-b12 FA, protonix , senokot, NaCl tabs, miralax    Labs reviewed: Na 129(L), K 4.3 wnl Hgb 8.3(L), Hct  23.4(L)  NUTRITION - FOCUSED PHYSICAL EXAM:  Flowsheet Row Most Recent Value  Orbital Region No depletion  Upper Arm Region Moderate depletion  Thoracic and Lumbar Region No depletion  Buccal Region No depletion  Temple Region No depletion  Clavicle Bone Region Mild depletion  Clavicle and Acromion Bone Region Mild depletion  Scapular Bone Region Mild depletion  Dorsal Hand Moderate depletion  Patellar Region Moderate depletion  Anterior Thigh Region Moderate depletion  Posterior Calf Region Moderate depletion  Edema (RD Assessment) Mild  Hair Reviewed  Eyes Reviewed  Mouth Reviewed  Skin Reviewed  Nails Reviewed   Diet Order:   Diet Order             Diet regular Room service appropriate? Yes; Fluid consistency: Thin; Fluid restriction: 1500 mL Fluid  Diet effective now                  EDUCATION NEEDS:   Education needs have been addressed  Skin:  Skin Assessment: Skin Integrity Issues: Skin Integrity Issues:: Incisions Incisions: closed lt hip  Last BM:  4/20- type 6  Height:   Ht Readings from Last 1 Encounters:  07/08/23 5\' 1"  (1.549 m)    Weight:   Wt Readings from Last 1 Encounters:  07/08/23 59.4 kg    Ideal Body Weight:  47.7 kg  BMI:  Body mass index is 24.75 kg/m.  Estimated Nutritional Needs:   Kcal:  1400-1600kcal/day  Protein:  70-80g/day  Fluid:  1.3-1.5L/day  Torrance Freestone MS, RD, LDN If unable to be reached, please send secure chat to "RD inpatient" available from 8:00a-4:00p daily

## 2023-07-15 NOTE — Progress Notes (Signed)
   Subjective: 6 Days Post-Op Procedure(s) (LRB): FIXATION, FRACTURE, INTERTROCHANTERIC, WITH INTRAMEDULLARY ROD (Right) Patient reports pain as mild.   Patient is well, and has had no acute complaints or problems Denies any CP, SOB, ABD pain. We will continue therapy today.  Plan is to go Skilled nursing facility after hospital stay.  Objective: Vital signs in last 24 hours: Temp:  [97.5 F (36.4 C)-98.6 F (37 C)] 97.5 F (36.4 C) (04/21 0728) Pulse Rate:  [71-100] 75 (04/21 0800) Resp:  [11-23] 21 (04/21 0800) BP: (109-176)/(50-126) 136/98 (04/21 0800) SpO2:  [96 %-100 %] 100 % (04/21 0800)  Intake/Output from previous day: 04/20 0701 - 04/21 0700 In: 424 [P.O.:120; Blood:304] Out: 500 [Urine:500] Intake/Output this shift: Total I/O In: 240 [P.O.:240] Out: 250 [Urine:250]  Recent Labs    07/14/23 0518 07/14/23 1712 07/15/23 0308  HGB 7.1* 8.8* 8.3*   Recent Labs    07/14/23 0518 07/14/23 1712 07/15/23 0308  WBC 5.8  --  5.2  RBC 2.06*  --  2.50*  HCT 19.7* 25.2* 23.4*  PLT 172  --  177   Recent Labs    07/14/23 0518 07/14/23 0845 07/14/23 1301 07/15/23 0308  NA 126*   < > 127* 129*  K 4.6  --   --  4.3  CL 96*  --   --  96*  CO2 27  --   --  27  BUN 13  --   --  12  CREATININE 0.49  --   --  0.47  GLUCOSE 105*  --   --  93  CALCIUM  8.3*  --   --  8.2*   < > = values in this interval not displayed.   No results for input(s): "LABPT", "INR" in the last 72 hours.  EXAM General - Patient is Alert, Appropriate, and Oriented Extremity - Neurovascular intact Sensation intact distally Intact pulses distally Dorsiflexion/Plantar flexion intact No cellulitis present Compartment soft, mild swelling Mild ecchymosis  Dressing - dressing C/D/I and no drainage Motor Function - intact, moving foot and toes well on exam.   Past Medical History:  Diagnosis Date   Chickenpox    Edema    feet/legs   Hyperglycemia    Hyperlipidemia    Hypertension     Osteopenia    PONV (postoperative nausea and vomiting)    after hand surgery    Assessment/Plan:   6 Days Post-Op Procedure(s) (LRB): FIXATION, FRACTURE, INTERTROCHANTERIC, WITH INTRAMEDULLARY ROD (Right) Principal Problem:   Closed intertrochanteric fracture of hip, right, initial encounter (HCC) Active Problems:   Chronic hyponatremia   Constipation   Hypertensive urgency   Malnutrition of moderate degree   Acute postoperative anemia due to expected blood loss  Estimated body mass index is 24.75 kg/m as calculated from the following:   Height as of this encounter: 5\' 1"  (1.549 m).   Weight as of this encounter: 59.4 kg. Advance diet Up with therapy Pain well controlled VSS Labs stable, Hgb 8.3. s/p 1 unit PRBC 07/14/2023. Na improving, Na 129 CM to assist with discharge to SNF pending medical clearance   Follow up with [facility] in 2 weeks Lovenox  40 mg SQ daily x 14 days   DVT Prophylaxis - Lovenox , TED hose, and SCDs Weight-Bearing as tolerated to right leg   T. Thomos Flies, PA-C [facility] 07/15/2023, 10:25 AM

## 2023-07-15 NOTE — Progress Notes (Signed)
 Progress Note   Patient: Shelly Armstrong:[id number] DOB: 1946/03/23 DOA: 07/08/2023     6 DOS: the patient was seen and examined on 07/15/2023   Brief hospital course: Taken from prior notes.  Shelly Armstrong is a 78 y.o. female with medical history significant for HTN, HLD admitted with displaced right intertrochanteric femur fracture following mechanical fall.  Orthopedic surgery was consulted and patient was taken to the OR on/15/25  4/16: S/p ORIF.  Labs with sodium of 122, chloride 92, leukocytosis resolved, hemoglobin of 10.3 decreased from 11.7 on admission likely due to intraoperative blood loss.  Starting on supplement. Hyponatremia labs ordered.  Hyponatremia seems chronic patient was on HCTZ at home which was discontinued.  Slight decrease in sodium after getting normal saline.  A.m. cortisol low at 4, serum osmolality low at 253, pending urine studies.  Will repeat a.m. cortisol tomorrow and also starting on salt tablet.  4/17: Worsening sodium, came back 116 with a.m. lab, given 1 L bolus of normal saline by night on-call provider-repeat sodium even lower at 115 Patient was feeling more lethargic and weak-transferring to stepdown, nephrology consult and starting on hypertonic saline. Hemoglobin with another significant drop to 8.3, no obvious bleeding but obviously patient recently had a hip fracture and surgery. Repeat a.m. cortisol levels at 11.7  4/18: Vital stable, sodium at 119>>121-patient remained on hypertonic saline, urine studies with sodium less than 10 and decreased urine osmolality at 196. Anemia panel with no significant deficiency.  4/19: Sodium slowly started decreasing again, at 122, hypertonic saline was discontinued when it started going up more rapidly and was at 126. Salt tablets dose was increased and doing fluid restriction today.  4/20: Vitals stable, slowly improving sodium currently at 126, hemoglobin with another decrease to 7.1, recent anemia  panel was without any significant deficiency patient was on supplement.  No obvious bleeding. Protein electrophoresis and kappa lab data labs are pending. Ordered 1 unit of PRBC. Persistent constipation-KUB was without any concern of obstruction, ordered more aggressive bowel regimen.  4/21: Hemodynamically stable, had a bowel movement and sodium at 129.  Hemoglobin at 8.3 after getting 1 unit of PRBC. TOC to work on placement  Assessment and Plan: * Closed intertrochanteric fracture of hip, right, initial encounter (HCC) Accidental fall Preoperative clearance was obtained S/p ORIF-tolerated the procedure well.  She will be weightbearing as tolerated per orthopedic surgery PT/OT are recommending SNF -Continue with supportive care and pain management  Hypertensive urgency Blood pressure now within goal.  It was significantly elevated on admission likely due to pain. -Continue home irbesartan  -Home HCTZ was discontinued due to hyponatremia  Chronic hyponatremia Acute on chronic hyponatremia. Seems chronic and slight worsening with normal saline.  Patient was also on HCTZ at home which is likely contributory.  Hyponatremia labs with slightly low serum and urine osmolality, urine sodium less than 10 TSH normal, a.m. repeat cortisol at 11.  Suspect SIADH Patient received hypertonic saline which was stopped  when sodium reached up to 126 and trending up above the goal.  Sodium at 129 this morning -Nephrology increase the dose of salt tablet -Fluid restriction -Monitor sodium  Acute postoperative anemia due to expected blood loss Hemoglobin decreased to 7.1, patient had his recent hip surgery and a fracture.  No other obvious bleeding at this time. No obvious dietary deficiency on anemia panel. Hemoglobin improved to 8.3 s/p 1 unit of PRBC -Started on supplement -Continue to monitor  Constipation Resolved with extensive bowel regimen,  had 2 large BMs Patient with persistent  constipation causing nausea and poor p.o. intake now.  History of chronic constipation. Simple MiraLAX  was not working.  KUB done today was negative for any concern of bowel obstruction. - Continue with bowel regimen  Malnutrition of moderate degree Dietitian consult      Subjective: Patient was feeling much improved when seen today.  Did had some breakfast.  Had 2 large bowel movements overnight and feeling much relieved.  Physical Exam: Vitals:   07/15/23 0400 07/15/23 0728 07/15/23 0800 07/15/23 1130  BP: (!) 143/50  (!) 136/98   Pulse: 71  75   Resp: 16  (!) 21   Temp: 98.6 F (37 C) (!) 97.5 F (36.4 C)  (!) 97.5 F (36.4 C)  TempSrc:  Oral  Axillary  SpO2: 100%  100%   Weight:      Height:       General.  Frail elderly lady, in no acute distress. Pulmonary.  Lungs clear bilaterally, normal respiratory effort. CV.  Regular rate and rhythm, no JVD, rub or murmur. Abdomen.  Soft, nontender, nondistended, BS positive. CNS.  Alert and oriented .  No focal neurologic deficit. Extremities.  No edema, no cyanosis, pulses intact and symmetrical. Psychiatry.  Judgment and insight appears normal.   Data Reviewed: Prior data reviewed  Family Communication: Talked with daughter on phone.  Disposition: Status is: Inpatient Remains inpatient appropriate because: Severity of illness  Planned Discharge Destination: Skilled nursing facility  DVT prophylaxis.  Lovenox  Time spent: 43 minutes  This record has been created using Conservation officer, historic buildings. Errors have been sought and corrected,but may not always be located. Such creation errors do not reflect on the standard of care.   Author: Luna Salinas, MD 07/15/2023 1:09 PM  For on call review [email or web address].

## 2023-07-15 NOTE — TOC Progression Note (Signed)
 Transition of Care [facility]) - Progression Note    Patient Details  Name: Shelly Armstrong MRN: [id number] Date of Birth: 08/31/1945  [facility] CM/SW Contact  Marybeth Dandy A Toris Laverdiere, RN Phone Number: 07/15/2023, 11:57 AM  Clinical Narrative:    Chart reviewed.  Noted that patient was admitted with right fixation, fracture.  Noted that patient had a right intertrochanteric with intramedullary rod on 07-09-23.    I have meet with patient at bedside today.  I have reviewed bed offers with patient.  She has selected [facility].  I have made [place] with [facility] aware that patient has accepted bed offer.  I have informed [place] that I will ask [employer] to submit for SNF and ambulance authorization.    I have left a voice message for [employer] to start SNF authorization and also requested for ambulance transport authorization.    TOC will continue to follow for discharge planning.            Expected Discharge Plan and Services                                               Social Determinants of Health (SDOH) Interventions SDOH Screenings   Food Insecurity: No Food Insecurity (07/11/2023)  Housing: Unknown (07/11/2023)  Transportation Needs: No Transportation Needs (07/11/2023)  Utilities: Not At Risk (07/11/2023)  Financial Resource Strain: Low Risk  (07/04/2023)   Received from [facility]  Social Connections: Moderately Isolated (07/11/2023)  Tobacco Use: Low Risk  (07/09/2023)    Readmission Risk Interventions     No data to display

## 2023-07-15 NOTE — Progress Notes (Signed)
 [facility]  ROUNDING NOTE   Subjective:   Patient reports feeling a bit nauseous this a.m. Resting comfortably in bed otherwise. Serum sodium up to 129.  Objective:  Vital signs in last 24 hours:  Temp:  [97.6 F (36.4 C)-98.6 F (37 C)] 98.6 F (37 C) (04/21 0400) Pulse Rate:  [71-100] 71 (04/21 0400) Resp:  [9-24] 16 (04/21 0400) BP: (92-176)/(50-126) 143/50 (04/21 0400) SpO2:  [96 %-100 %] 100 % (04/21 0400)  Weight change:  Filed Weights   07/08/23 2149  Weight: 59.4 kg    Intake/Output: I/O last 3 completed shifts: In: 544 [P.O.:240; Blood:304] Out: 1300 [Urine:1300]   Intake/Output this shift:  No intake/output data recorded.  Physical Exam: General: NAD, laying in bed  Head: Normocephalic, atraumatic. Moist oral mucosal membranes  Eyes: Anicteric  Lungs:  Clear to auscultation  Heart: Regular rate and rhythm  Abdomen:  Soft, nontender,   Extremities: No peripheral edema.  Neurologic: Alert, moving all four extremities  Skin: No lesions       Basic Metabolic Panel: Recent Labs  Lab 07/10/23 0417 07/11/23 0536 07/11/23 1036 07/12/23 0501 07/12/23 0930 07/14/23 0053 07/14/23 0518 07/14/23 0845 07/14/23 1301 07/15/23 0308  NA 122* 116*   < > 119*   < > 124* 126* 125* 127* 129*  K 3.9 4.6  --  4.6  --   --  4.6  --   --  4.3  CL 92* 85*  --  92*  --   --  96*  --   --  96*  CO2 23 24  --  22  --   --  27  --   --  27  GLUCOSE 154* 125*  --  104*  --   --  105*  --   --  93  BUN 16 23  --  14  --   --  13  --   --  12  CREATININE 0.52 0.49  --  0.57  --   --  0.49  --   --  0.47  CALCIUM  8.6* 8.3*  --  8.0*  --   --  8.3*  --   --  8.2*   < > = values in this interval not displayed.    Liver Function Tests: Recent Labs  Lab 07/10/23 0417  AST 30  ALT 24  ALKPHOS 44  BILITOT 0.6  PROT 5.3*  ALBUMIN 3.3*   No results for input(s): "LIPASE", "AMYLASE" in the last 168 hours. No results for input(s): "AMMONIA" in the last  168 hours.  CBC: Recent Labs  Lab 07/09/23 2213 07/10/23 0417 07/11/23 0536 07/14/23 0518 07/14/23 1712 07/15/23 0308  WBC 11.3* 9.2 7.4 5.8  --  5.2  HGB 11.6* 10.3* 8.3* 7.1* 8.8* 8.3*  HCT 31.5* 28.4* 22.8* 19.7* 25.2* 23.4*  MCV 92.1 91.9 91.2 95.6  --  93.6  PLT 149* 154 124* 172  --  177    Cardiac Enzymes: No results for input(s): "CKTOTAL", "CKMB", "CKMBINDEX", "TROPONINI" in the last 168 hours.  BNP: Invalid input(s): "POCBNP"  CBG: Recent Labs  Lab 07/11/23 1229  GLUCAP 147*    Microbiology: No results found for this or any previous visit.  Coagulation Studies: No results for input(s): "LABPROT", "INR" in the last 72 hours.  Urinalysis: No results for input(s): "COLORURINE", "LABSPEC", "PHURINE", "GLUCOSEU", "HGBUR", "BILIRUBINUR", "KETONESUR", "PROTEINUR", "UROBILINOGEN", "NITRITE", "LEUKOCYTESUR" in the last 72 hours.  Invalid input(s): "APPERANCEUR"    Imaging: DG  Abd 1 View Result Date: 07/14/2023 CLINICAL DATA:  Abdominal pain. EXAM: ABDOMEN - 1 VIEW COMPARISON:  None Available. FINDINGS: The bowel gas pattern is normal. No radio-opaque calculi or other significant radiographic abnormality are seen. IMPRESSION: Negative. Electronically Signed   By: Sydell Eva M.D.   On: 07/14/2023 14:05     Medications:    sodium chloride  (hypertonic) Stopped (07/12/23 1730)    aspirin  EC  81 mg Oral Daily   atorvastatin   40 mg Oral Daily   carvedilol   6.25 mg Oral BID WC   Chlorhexidine  Gluconate Cloth  6 each Topical Daily   enoxaparin  (LOVENOX ) injection  40 mg Subcutaneous Q24H   Fe Fum-Vit C-Vit B12-FA  1 capsule Oral BID   feeding supplement  237 mL Oral BID BM   irbesartan   300 mg Oral Daily   multivitamin with minerals  1 tablet Oral Daily   pantoprazole   40 mg Oral Daily   polyethylene glycol  17 g Oral BID   senna  2 tablet Oral Daily   sodium chloride   1 g Oral TID WC   SMOG  960 mL Rectal Once   acetaminophen ,  HYDROcodone -acetaminophen , labetalol , lactulose , menthol -cetylpyridinium **OR** phenol, methocarbamol  **OR** methocarbamol  (ROBAXIN ) injection, metoCLOPramide  **OR** metoCLOPramide  (REGLAN ) injection, ondansetron  (ZOFRAN ) IV, mouth rinse, sodium phosphate   Assessment/ Plan:  Ms. Shelly Armstrong is a 78 y.o.  female with medical problems of  hypertension, hyperlipidemia presented for low back pain, chronic constipation.  She had an accidental fall which resulted in right intertrochanteric fracture.  She was admitted on 07/08/2023 for Hip fracture (HCC) [S72.009A] Hyponatremia [E87.1] Right hip pain [M25.551] Fall, initial encounter [W19.XXXA] Closed right hip fracture, initial encounter (HCC) [S72.001A] Pain in right femur [M89.8X5]   1.  Acute on chronic hyponatremia Patient appears to be euvolemic.  Serum osmolality at 253. Normal TSH 07/09/2023. A.m. cortisol was low on 416 but improved on 07/11/2023. Update: Patient doing well.  Serum sodium up to 129.  No longer requiring 3% saline.  Continue salt tablets 1 g 3 times daily.  She will need continued monitoring of sodium as outpatient.     2.  Right intertrochanteric fracture Patient underwent right hip intramedullary nail placement on 07/09/2023 by Dr. Clyda Dark.       3.  Anemia: NOS: Hemoglobin improved to 8.3.  Continue to monitor CBC.     LOS: 6 Marypat Kimmet 4/21/20257:26 AM

## 2023-07-15 NOTE — Plan of Care (Signed)
  Problem: Education: Goal: Knowledge of General Education information will improve Description: Including pain rating scale, medication(s)/side effects and non-pharmacologic comfort measures 07/15/2023 0818 by Bartley Borrow, RN Outcome: Progressing 07/15/2023 0817 by Bartley Borrow, RN Outcome: Progressing   Problem: Health Behavior/Discharge Planning: Goal: Ability to manage health-related needs will improve 07/15/2023 0818 by Bartley Borrow, RN Outcome: Progressing 07/15/2023 0817 by Bartley Borrow, RN Outcome: Progressing   Problem: Clinical Measurements: Goal: Ability to maintain clinical measurements within normal limits will improve Outcome: Progressing Goal: Will remain free from infection Outcome: Progressing Goal: Diagnostic test results will improve Outcome: Progressing Goal: Respiratory complications will improve Outcome: Progressing Goal: Cardiovascular complication will be avoided Outcome: Progressing   Problem: Activity: Goal: Risk for activity intolerance will decrease Outcome: Progressing   Problem: Nutrition: Goal: Adequate nutrition will be maintained Outcome: Progressing   Problem: Elimination: Goal: Will not experience complications related to bowel motility Outcome: Progressing Goal: Will not experience complications related to urinary retention Outcome: Progressing   Problem: Pain Managment: Goal: General experience of comfort will improve and/or be controlled Outcome: Progressing   Problem: Safety: Goal: Ability to remain free from injury will improve Outcome: Progressing   Problem: Skin Integrity: Goal: Risk for impaired skin integrity will decrease Outcome: Progressing   Problem: Education: Goal: Verbalization of understanding the information provided (i.e., activity precautions, restrictions, etc) will improve Outcome: Progressing   Problem: Activity: Goal: Ability to ambulate and perform ADLs will  improve Outcome: Progressing   Problem: Clinical Measurements: Goal: Postoperative complications will be avoided or minimized Outcome: Progressing   Problem: Self-Concept: Goal: Ability to maintain and perform role responsibilities to the fullest extent possible will improve Outcome: Progressing   Problem: Pain Management: Goal: Pain level will decrease Outcome: Progressing

## 2023-07-15 NOTE — Plan of Care (Signed)
  Problem: Clinical Measurements: Goal: Ability to maintain clinical measurements within normal limits will improve Outcome: Progressing Goal: Will remain free from infection Outcome: Progressing Goal: Diagnostic test results will improve Outcome: Progressing Goal: Respiratory complications will improve Outcome: Progressing Goal: Cardiovascular complication will be avoided Outcome: Progressing   Problem: Activity: Goal: Risk for activity intolerance will decrease Outcome: Progressing   Problem: Elimination: Goal: Will not experience complications related to bowel motility Outcome: Progressing Goal: Will not experience complications related to urinary retention Outcome: Progressing   Problem: Elimination: Goal: Will not experience complications related to bowel motility Outcome: Progressing Goal: Will not experience complications related to urinary retention Outcome: Progressing

## 2023-07-15 NOTE — Progress Notes (Signed)
 Patient transferred to 1A with this RN. AxOx4. VSS. All patient belongings transferred to patient's room with sister.

## 2023-07-16 DIAGNOSIS — D62 Acute posthemorrhagic anemia: Secondary | ICD-10-CM | POA: Diagnosis not present

## 2023-07-16 DIAGNOSIS — I959 Hypotension, unspecified: Secondary | ICD-10-CM | POA: Diagnosis not present

## 2023-07-16 DIAGNOSIS — S72141A Displaced intertrochanteric fracture of right femur, initial encounter for closed fracture: Secondary | ICD-10-CM | POA: Diagnosis not present

## 2023-07-16 DIAGNOSIS — I160 Hypertensive urgency: Secondary | ICD-10-CM | POA: Diagnosis not present

## 2023-07-16 DIAGNOSIS — S72141D Displaced intertrochanteric fracture of right femur, subsequent encounter for closed fracture with routine healing: Secondary | ICD-10-CM | POA: Diagnosis not present

## 2023-07-16 DIAGNOSIS — K5900 Constipation, unspecified: Secondary | ICD-10-CM | POA: Diagnosis not present

## 2023-07-16 DIAGNOSIS — Z7982 Long term (current) use of aspirin: Secondary | ICD-10-CM | POA: Diagnosis not present

## 2023-07-16 DIAGNOSIS — R600 Localized edema: Secondary | ICD-10-CM | POA: Diagnosis not present

## 2023-07-16 DIAGNOSIS — W19XXXD Unspecified fall, subsequent encounter: Secondary | ICD-10-CM | POA: Diagnosis not present

## 2023-07-16 DIAGNOSIS — E871 Hypo-osmolality and hyponatremia: Secondary | ICD-10-CM | POA: Diagnosis not present

## 2023-07-16 DIAGNOSIS — W19XXXA Unspecified fall, initial encounter: Secondary | ICD-10-CM

## 2023-07-16 DIAGNOSIS — K219 Gastro-esophageal reflux disease without esophagitis: Secondary | ICD-10-CM | POA: Diagnosis not present

## 2023-07-16 DIAGNOSIS — Z7401 Bed confinement status: Secondary | ICD-10-CM | POA: Diagnosis not present

## 2023-07-16 DIAGNOSIS — I10 Essential (primary) hypertension: Secondary | ICD-10-CM | POA: Diagnosis not present

## 2023-07-16 DIAGNOSIS — S72001A Fracture of unspecified part of neck of right femur, initial encounter for closed fracture: Secondary | ICD-10-CM | POA: Diagnosis not present

## 2023-07-16 DIAGNOSIS — Z9181 History of falling: Secondary | ICD-10-CM | POA: Diagnosis not present

## 2023-07-16 DIAGNOSIS — E785 Hyperlipidemia, unspecified: Secondary | ICD-10-CM | POA: Diagnosis not present

## 2023-07-16 DIAGNOSIS — E440 Moderate protein-calorie malnutrition: Secondary | ICD-10-CM | POA: Diagnosis not present

## 2023-07-16 LAB — BASIC METABOLIC PANEL WITH GFR
Anion gap: 6 (ref 5–15)
Anion gap: 7 (ref 5–15)
BUN: 21 mg/dL (ref 8–23)
BUN: 19 mg/dL (ref 8–23)
CO2: 26 mmol/L (ref 22–32)
CO2: 27 mmol/L (ref 22–32)
Calcium: 8.4 mg/dL — ABNORMAL LOW (ref 8.9–10.3)
Calcium: 8.5 mg/dL — ABNORMAL LOW (ref 8.9–10.3)
Chloride: 96 mmol/L — ABNORMAL LOW (ref 98–111)
Chloride: 96 mmol/L — ABNORMAL LOW (ref 98–111)
Creatinine, Ser: 0.37 mg/dL — ABNORMAL LOW (ref 0.44–1.00)
Creatinine, Ser: 0.52 mg/dL (ref 0.44–1.00)
GFR, Estimated: >60 mL/min (ref >60)
GFR, Estimated: >60 mL/min (ref >60)
Glucose, Bld: 104 mg/dL — ABNORMAL HIGH (ref 70–99)
Glucose, Bld: 138 mg/dL — ABNORMAL HIGH (ref 70–99)
Potassium: 4.1 mmol/L (ref 3.5–5.1)
Potassium: 4.1 mmol/L (ref 3.5–5.1)
Sodium: 128 mmol/L — ABNORMAL LOW (ref 135–145)
Sodium: 130 mmol/L — ABNORMAL LOW (ref 135–145)

## 2023-07-16 LAB — CBC
HCT: 23.6 % — ABNORMAL LOW (ref 36.0–46.0)
Hemoglobin: 8.2 g/dL — ABNORMAL LOW (ref 12.0–15.0)
MCH: 32.5 pg (ref 26.0–34.0)
MCHC: 34.7 g/dL (ref 30.0–36.0)
MCV: 93.7 fL (ref 80.0–100.0)
Platelets: 206 10*3/uL (ref 150–400)
RBC: 2.52 MIL/uL — ABNORMAL LOW (ref 3.87–5.11)
RDW: 15.2 % (ref 11.5–15.5)
WBC: 5.8 10*3/uL (ref 4.0–10.5)
nRBC: 0 % (ref 0.0–0.2)

## 2023-07-16 LAB — HEPATIC FUNCTION PANEL
ALT: 29 U/L (ref 0–44)
AST: 41 U/L (ref 15–41)
Albumin: 3.1 g/dL — ABNORMAL LOW (ref 3.5–5.0)
Alkaline Phosphatase: 60 U/L (ref 38–126)
Bilirubin, Direct: 0.2 mg/dL (ref 0.0–0.2)
Indirect Bilirubin: 0.9 mg/dL (ref 0.3–0.9)
Total Bilirubin: 1.1 mg/dL (ref 0.0–1.2)
Total Protein: 5.1 g/dL — ABNORMAL LOW (ref 6.5–8.1)

## 2023-07-16 LAB — SODIUM: Sodium: 133 mmol/L — ABNORMAL LOW (ref 135–145)

## 2023-07-16 MED ORDER — ENSURE ENLIVE PO LIQD: 237.0000 mL | Freq: Two times a day (BID) | ORAL | Status: AC

## 2023-07-16 MED ORDER — FE FUM-VIT C-VIT B12-FA 460-60-0.01-1 MG PO CAPS: 1.0000 | ORAL_CAPSULE | Freq: Two times a day (BID) | ORAL | Status: AC

## 2023-07-16 MED ORDER — HYDROCODONE-ACETAMINOPHEN 5-325 MG PO TABS
1.0000 | ORAL_TABLET | Freq: Four times a day (QID) | ORAL | 0 refills | Status: AC | PRN
Start: 2023-07-16 — End: ?

## 2023-07-16 MED ORDER — METHOCARBAMOL 500 MG PO TABS: 500.0000 mg | ORAL_TABLET | Freq: Four times a day (QID) | ORAL | Status: AC | PRN

## 2023-07-16 MED ORDER — IRBESARTAN 300 MG PO TABS: 300.0000 mg | ORAL_TABLET | Freq: Every day | ORAL | Status: DC

## 2023-07-16 MED ORDER — CARVEDILOL 6.25 MG PO TABS: 6.2500 mg | ORAL_TABLET | Freq: Two times a day (BID) | ORAL | Status: DC

## 2023-07-16 MED ORDER — SODIUM CHLORIDE 1 G PO TABS: 1.0000 g | ORAL_TABLET | Freq: Three times a day (TID) | ORAL | Status: AC

## 2023-07-16 MED ORDER — TOLVAPTAN 15 MG PO TABS
15.0000 mg | ORAL_TABLET | Freq: Once | ORAL | Status: AC
  Administered 2023-07-16: 15 mg via ORAL
  Filled 2023-07-16: qty 1

## 2023-07-16 NOTE — Discharge Summary (Signed)
 Physician Discharge Summary   Patient: Shelly Armstrong MRN: [id number] DOB: 1946/01/29  Admit date:     07/08/2023  Discharge date: 07/16/23  Discharge Physician: Luna Salinas   PCP: Jimmy Moulding, MD   Recommendations at discharge:  Please obtain CBC and BMP in next few days Please monitor sodium and continue with salt tablet Please do not restart hydrochlorothiazide  or similar antihypertensives, another agent can be added if needed Follow-up with primary care provider Follow-up with orthopedic surgery  Discharge Diagnoses: Principal Problem:   Closed intertrochanteric fracture of hip, right, initial encounter [facility]) Active Problems:   Hypertensive urgency   Chronic hyponatremia   Acute postoperative anemia due to expected blood loss   Constipation   Malnutrition of moderate degree   [facility]: Taken from prior notes.  Shelly Armstrong is a 78 y.o. female with medical history significant for HTN, HLD admitted with displaced right intertrochanteric femur fracture following mechanical fall.  Orthopedic surgery was consulted and patient was taken to the OR on/15/25  4/16: S/p ORIF.  Labs with sodium of 122, chloride 92, leukocytosis resolved, hemoglobin of 10.3 decreased from 11.7 on admission likely due to intraoperative blood loss.  Starting on supplement. Hyponatremia labs ordered.  Hyponatremia seems chronic patient was on HCTZ at home which was discontinued.  Slight decrease in sodium after getting normal saline.  A.m. cortisol low at 4, serum osmolality low at 253, pending urine studies.  Will repeat a.m. cortisol tomorrow and also starting on salt tablet.  4/17: Worsening sodium, came back 116 with a.m. lab, given 1 L bolus of normal saline by night on-call provider-repeat sodium even lower at 115 Patient was feeling more lethargic and weak-transferring to stepdown, nephrology consult and starting on hypertonic saline. Hemoglobin with another  significant drop to 8.3, no obvious bleeding but obviously patient recently had a hip fracture and surgery. Repeat a.m. cortisol levels at 11.7  4/18: Vital stable, sodium at 119>>121-patient remained on hypertonic saline, urine studies with sodium less than 10 and decreased urine osmolality at 196. Anemia panel with no significant deficiency.  4/19: Sodium slowly started decreasing again, at 122, hypertonic saline was discontinued when it started going up more rapidly and was at 126. Salt tablets dose was increased and doing fluid restriction today.  4/20: Vitals stable, slowly improving sodium currently at 126, hemoglobin with another decrease to 7.1, recent anemia panel was without any significant deficiency patient was on supplement.  No obvious bleeding. Protein electrophoresis and kappa lab data labs are pending. Ordered 1 unit of PRBC. Persistent constipation-KUB was without any concern of obstruction, ordered more aggressive bowel regimen.  4/21: Hemodynamically stable, had a bowel movement and sodium at 129.  Hemoglobin at 8.3 after getting 1 unit of PRBC. TOC to work on placement  4/22: Remained hemodynamically stable.  Large healing bruising on right hip due to recent fracture, likely a hematoma and ORIF.  Sodium at 128. Continue to have some intermittent pain which increases with ambulation.  Slowly improving sodium and hip pain. Hemoglobin seems stable-patient will continue with iron supplement HCTZ was discontinued and should not be restarted.  PCP can titrate irbesartan  or add  another agent if needed.  Patient should avoid constipation and use regular bowel regimen especially while taking pain medications.  She will continue with current medications and need to have a close follow-up with her PCP and orthopedic surgeon for further recommendations.  Assessment and Plan: * Closed intertrochanteric fracture of hip, right,  initial encounter [facility]) Accidental fall Preoperative  clearance was obtained S/p ORIF-tolerated the procedure well.  She will be weightbearing as tolerated per orthopedic surgery PT/OT are recommending SNF -Continue with supportive care and pain management  Hypertensive urgency Blood pressure now within goal.  It was significantly elevated on admission likely due to pain. -Continue home irbesartan  -Home HCTZ was discontinued due to hyponatremia  Chronic hyponatremia Acute on chronic hyponatremia. Seems chronic and slight worsening with normal saline.  Patient was also on HCTZ at home which is likely contributory.  Hyponatremia labs with slightly low serum and urine osmolality, urine sodium less than 10 TSH normal, a.m. repeat cortisol at 11.  Suspect SIADH Patient received hypertonic saline which was stopped  when sodium reached up to 126 and trending up above the goal.  Sodium at 128 this morning -Continue with 1 g of salt tablet 3 times daily with meals -Fluid restriction -Monitor sodium  Acute postoperative anemia due to expected blood loss Hemoglobin decreased to 7.1, patient had his recent hip surgery and a fracture.  No other obvious bleeding at this time, likely a postoperative hematoma and intraoperative blood loss No obvious dietary deficiency on anemia panel. Hemoglobin improved to 8.3 s/p 1 unit of PRBC -Started on supplement -Continue to monitor  Constipation Resolved with extensive bowel regimen, had 2 large BMs Patient with persistent constipation causing nausea and poor p.o. intake now.  History of chronic constipation. Simple MiraLAX  was not working.  KUB done today was negative for any concern of bowel obstruction. - Continue with bowel regimen  Malnutrition of moderate degree Dietitian consult      Pain control - [place]  Controlled Substance Reporting System database was reviewed. and patient was instructed, not to drive, operate heavy machinery, perform activities at heights, swimming or participation in  water activities or provide baby-sitting services while on Pain, Sleep and Anxiety Medications; until their outpatient Physician has advised to do so again. Also recommended to not to take more than prescribed Pain, Sleep and Anxiety Medications.  Consultants: Orthopedic surgery.  Nephrology Procedures performed: ORIF Disposition: Skilled nursing facility Diet recommendation:  Discharge Diet Orders (From admission, onward)     Start     Ordered   07/16/23 0000  Diet - low sodium heart healthy        07/16/23 1440           Regular diet DISCHARGE MEDICATION: Allergies as of 07/16/2023       Reactions   Sulfamethoxazole-trimethoprim Rash, Other (See Comments)        Medication List     STOP taking these medications    olmesartan-hydrochlorothiazide  40-25 MG tablet Commonly known as: BENICAR HCT       TAKE these medications    acetaminophen  500 MG tablet Commonly known as: TYLENOL  Take 1,000 mg by mouth every 6 (six) hours as needed for moderate pain.   aspirin  81 MG tablet Take 81 mg by mouth daily.   atorvastatin  40 MG tablet Commonly known as: LIPITOR Take 40 mg by mouth daily.   CALTRATE 600 PLUS-VIT D PO Take 1 tablet by mouth 2 (two) times daily.   carvedilol  6.25 MG tablet Commonly known as: COREG  Take 1 tablet (6.25 mg total) by mouth 2 (two) times daily with a meal.   CENTRUM SILVER PO Take 1 tablet by mouth daily.   Constulose  10 GM/15ML solution Generic drug: lactulose  Take 10 g by mouth daily as needed.   dicyclomine  10 MG capsule Commonly known  as: BENTYL  Take 10 mg by mouth 4 (four) times daily -  before meals and at bedtime.   Fe Fum-Vit C-Vit B12-FA Caps capsule Commonly known as: TRIGELS-F FORTE Take 1 capsule by mouth 2 (two) times daily.   feeding supplement Liqd Take 237 mLs by mouth 2 (two) times daily between meals.   HYDROcodone -acetaminophen  5-325 MG tablet Commonly known as: NORCO/VICODIN Take 1-2 tablets by mouth every  6 (six) hours as needed for moderate pain (pain score 4-6).   irbesartan  300 MG tablet Commonly known as: AVAPRO  Take 1 tablet (300 mg total) by mouth daily. Start taking on: July 17, 2023   methocarbamol  500 MG tablet Commonly known as: ROBAXIN  Take 1 tablet (500 mg total) by mouth every 6 (six) hours as needed for muscle spasms.   Miebo 1.338 GM/ML Soln Generic drug: Perfluorohexyloctane Apply 1 drop to eye 4 (four) times daily.   pantoprazole  40 MG tablet Commonly known as: PROTONIX  Take 40 mg by mouth daily.   Polyethylene Glycol 3350 -GRX Powd Take 17 g by mouth daily as needed.   senna 8.6 MG Tabs tablet Commonly known as: SENOKOT Take 1 tablet by mouth.   sodium chloride  1 g tablet Take 1 tablet (1 g total) by mouth 3 (three) times daily with meals.        Contact information for follow-up providers     Venus Ginsberg, MD. Schedule an appointment as soon as possible for a visit in 1 week(s).   Specialty: Orthopedic Surgery Contact information: [address] [place] [address] [phone]         Jimmy Moulding, MD. Schedule an appointment as soon as possible for a visit in 1 week(s).   Specialty: Internal Medicine Contact information: [address] [facility] [place] [address] [phone]              Contact information for after-discharge care     Destination     [facility] Preferred SNF .   Service: Skilled Nursing Contact information: [address] [place]  [address] [phone]                    Discharge Exam: Cleavon Curls Weights   07/08/23 2149 07/16/23 0500  Weight: 59.4 kg 69.8 kg   General.  Frail elderly lady, in no acute distress. Pulmonary.  Lungs clear bilaterally, normal respiratory effort. CV.  Regular rate and rhythm, no JVD, rub or murmur. Abdomen.  Soft, nontender,  nondistended, BS positive. CNS.  Alert and oriented .  No focal neurologic deficit. Extremities.  No edema, no cyanosis, pulses intact and symmetrical. Psychiatry.  Judgment and insight appears normal.   Condition at discharge: stable  The results of significant diagnostics from this hospitalization (including imaging, microbiology, ancillary and laboratory) are listed below for reference.   Imaging Studies: DG Abd 1 View Result Date: 07/14/2023 CLINICAL DATA:  Abdominal pain. EXAM: ABDOMEN - 1 VIEW COMPARISON:  None Available. FINDINGS: The bowel gas pattern is normal. No radio-opaque calculi or other significant radiographic abnormality are seen. IMPRESSION: Negative. Electronically Signed   By: Sydell Eva M.D.   On: 07/14/2023 14:05   DG HIP UNILAT WITH PELVIS 2-3 VIEWS RIGHT Result Date: 07/09/2023 CLINICAL DATA:  Elective surgery. EXAM: DG HIP (WITH OR WITHOUT PELVIS) 2-3V RIGHT COMPARISON:  Preoperative imaging FINDINGS: Four fluoroscopic spot views of the right hip submitted from the operating room.  Femoral intramedullary nail with trans trochanteric and distal locking screw fixation traverse proximal femur fracture. Fluoroscopy time 3 minutes. Dose 32.52 mGy. IMPRESSION: Intraoperative fluoroscopy during right femur fracture fixation. Electronically Signed   By: Chadwick Colonel M.D.   On: 07/09/2023 16:54   DG C-Arm 1-60 Min-No Report Result Date: 07/09/2023 Fluoroscopy was utilized by the requesting physician.  No radiographic interpretation.   DG C-Arm 1-60 Min-No Report Result Date: 07/09/2023 Fluoroscopy was utilized by the requesting physician.  No radiographic interpretation.   DG Chest Port 1 View Result Date: 07/09/2023 CLINICAL DATA:  Known right hip fracture preoperative evaluation EXAM: PORTABLE CHEST 1 VIEW COMPARISON:  12/03/2016 FINDINGS: The heart size and mediastinal contours are within normal limits. Both lungs are clear. The visualized skeletal structures are  unremarkable. IMPRESSION: No active disease. Electronically Signed   By: Violeta Grey M.D.   On: 07/09/2023 00:01   DG Femur Min 2 Views Right Result Date: 07/08/2023 CLINICAL DATA:  Fall, right hip pain EXAM: RIGHT FEMUR 2 VIEWS COMPARISON:  None Available. FINDINGS: There is a right femoral intertrochanteric fracture with mild displacement. No significant angulation. No additional acute bony abnormality. Mild degenerative changes in the right hip and knee joints. IMPRESSION: Right femoral intertrochanteric fracture. Electronically Signed   By: Janeece Mechanic M.D.   On: 07/08/2023 23:30   DG Hip Unilat  With Pelvis 2-3 Views Right Result Date: 07/08/2023 CLINICAL DATA:  Right hip pain EXAM: DG HIP (WITH OR WITHOUT PELVIS) 2-3V RIGHT COMPARISON:  None Available. FINDINGS: Right femoral intertrochanteric fracture noted. No significant angulation. Mild displacement. No subluxation or dislocation. IMPRESSION: Right femoral intertrochanteric fracture. Electronically Signed   By: Janeece Mechanic M.D.   On: 07/08/2023 23:30    Microbiology: No results found for this or any previous visit.  Labs: CBC: Recent Labs  Lab 07/10/23 0417 07/11/23 0536 07/14/23 0518 07/14/23 1712 07/15/23 0308 07/16/23 0502  WBC 9.2 7.4 5.8  --  5.2 5.8  HGB 10.3* 8.3* 7.1* 8.8* 8.3* 8.2*  HCT 28.4* 22.8* 19.7* 25.2* 23.4* 23.6*  MCV 91.9 91.2 95.6  --  93.6 93.7  PLT 154 124* 172  --  177 206   Basic Metabolic Panel: Recent Labs  Lab 07/12/23 0501 07/12/23 0930 07/14/23 0518 07/14/23 0845 07/14/23 1301 07/15/23 0308 07/16/23 0502 07/16/23 0852  NA 119*   < > 126* 125* 127* 129* 128* 130*  K 4.6  --  4.6  --   --  4.3 4.1 4.1  CL 92*  --  96*  --   --  96* 96* 96*  CO2 22  --  27  --   --  27 26 27   GLUCOSE 104*  --  105*  --   --  93 104* 138*  BUN 14  --  13  --   --  12 21 19   CREATININE 0.57  --  0.49  --   --  0.47 0.37* 0.52  CALCIUM  8.0*  --  8.3*  --   --  8.2* 8.4* 8.5*   < > = values in this  interval not displayed.   Liver Function Tests: Recent Labs  Lab 07/10/23 0417 07/16/23 0852  AST 30 41  ALT 24 29  ALKPHOS 44 60  BILITOT 0.6 1.1  PROT 5.3* 5.1*  ALBUMIN 3.3* 3.1*   CBG: Recent Labs  Lab 07/11/23 1229  GLUCAP 147*    Discharge time spent: greater than 30 minutes.  This record has been created  using Conservation officer, historic buildings. Errors have been sought and corrected,but may not always be located. Such creation errors do not reflect on the standard of care.   Signed: Luna Salinas, MD [facility] 07/16/2023

## 2023-07-16 NOTE — Progress Notes (Addendum)
 PHARMACY CONSULT NOTE - Tolvaptan   Pharmacy Consult for Tolvaptan  Monitoring  Recent Labs: Potassium (mmol/L)  Date Value  07/16/2023 4.1   Calcium  (mg/dL)  Date Value  40/98/1191 8.5 (L)   Albumin (g/dL)  Date Value  47/82/9562 3.1 (L)   Sodium (mmol/L)  Date Value  07/16/2023 130 (L)   Assessment  Shelly Armstrong is a 78 y.o. female presenting with hip fx. PMH significant for  HTN, HLD admitted with displaced right intertrochanteric femur fracture following mechanical fall.  Orthopedic surgery was consulted and patient was taken to the OR on/15/25.  Patient found to have serum Na of  124 on admission. Pharmacy has been consulted to monitor tolvaptan . Note: recently on 3% Nacl drip from 4/17 thru 07/15/23  Pertinent medications: sodium chloride  1 gm  TID  (increased from BID on 4/19) Drug interactions: N/A  4/22 0852 Na 130 4/22 0935 tolvaptan  15 mg po x 1 given 4/22 1603 Na 133   Goal of Therapy:  Na > 130 Do not exceed increase in Na by 8 mEq/L per 8 hours or 12 mEq/L in 24 hours  Plan:  Tolvaptan  15 mg x 1 given Check Na Q8H x2 then daily thereafter Continue to monitor for signs of clinical improvement and recommendations per nephrology  Pansy Bogus, PharmD Pharmacy Resident  07/16/2023 4:37 PM

## 2023-07-16 NOTE — TOC Transition Note (Signed)
 Transition of Care [facility]) - Discharge Note   Patient Details  Name: Shelly Armstrong MRN: [id number] Date of Birth: [birth year]  [facility]) CM/SW Contact:  Crayton Docker, RN Phone Number: 07/16/2023, 2:56 PM   Clinical Narrative:     Discharge orders noted, discharge summary noted. CM faxed discharge discharg summary, discharge orders and SNF transfer report to [facility] and [facility]. CM call placed to [facility]. CM spoke to [place]. [facility] scheduled for 1630. CM secure message to Antony Baumgartner, Admissions regarding SNF Auth number.CM provided RN report number to [employer].   Final next level of care: Skilled Nursing Facility Barriers to Discharge: No Barriers Identified   Patient Goals and CMS Choice    SNF   Discharge Placement     SNF             Discharge Plan and Services Additional resources added to the After Visit Summary for      SNF     Social Drivers of Health (SDOH) Interventions SDOH Screenings   Food Insecurity: No Food Insecurity (07/11/2023)  Housing: Unknown (07/11/2023)  Transportation Needs: No Transportation Needs (07/11/2023)  Utilities: Not At Risk (07/11/2023)  Financial Resource Strain: Low Risk  (07/04/2023)   Received from [facility]  Social Connections: Moderately Isolated (07/11/2023)  Tobacco Use: Low Risk  (07/09/2023)     Readmission Risk Interventions     No data to display

## 2023-07-16 NOTE — Plan of Care (Signed)
  Problem: Education: Goal: Knowledge of General Education information will improve Description: Including pain rating scale, medication(s)/side effects and non-pharmacologic comfort measures Outcome: Progressing   Problem: Health Behavior/Discharge Planning: Goal: Ability to manage health-related needs will improve Outcome: Progressing   Problem: Clinical Measurements: Goal: Ability to maintain clinical measurements within normal limits will improve Outcome: Progressing Goal: Will remain free from infection Outcome: Progressing Goal: Diagnostic test results will improve Outcome: Progressing Goal: Cardiovascular complication will be avoided Outcome: Progressing   Problem: Activity: Goal: Risk for activity intolerance will decrease Outcome: Progressing   Problem: Nutrition: Goal: Adequate nutrition will be maintained Outcome: Progressing   Problem: Coping: Goal: Level of anxiety will decrease Outcome: Progressing   Problem: Elimination: Goal: Will not experience complications related to bowel motility Outcome: Progressing Goal: Will not experience complications related to urinary retention Outcome: Progressing   Problem: Pain Managment: Goal: General experience of comfort will improve and/or be controlled Outcome: Progressing   Problem: Safety: Goal: Ability to remain free from injury will improve Outcome: Progressing   Problem: Skin Integrity: Goal: Risk for impaired skin integrity will decrease Outcome: Progressing   Problem: Education: Goal: Verbalization of understanding the information provided (i.e., activity precautions, restrictions, etc) will improve Outcome: Progressing Goal: Individualized Educational Video(s) Outcome: Progressing   Problem: Activity: Goal: Ability to ambulate and perform ADLs will improve Outcome: Progressing   Problem: Clinical Measurements: Goal: Postoperative complications will be avoided or minimized Outcome: Progressing    Problem: Self-Concept: Goal: Ability to maintain and perform role responsibilities to the fullest extent possible will improve Outcome: Progressing   Problem: Pain Management: Goal: Pain level will decrease Outcome: Progressing   Problem: Clinical Measurements: Goal: Respiratory complications will improve Outcome: Adequate for Discharge

## 2023-07-16 NOTE — Plan of Care (Signed)
  Problem: Clinical Measurements: Goal: Will remain free from infection Outcome: Adequate for Discharge   Problem: Clinical Measurements: Goal: Ability to maintain clinical measurements within normal limits will improve Outcome: Adequate for Discharge   Problem: Health Behavior/Discharge Planning: Goal: Ability to manage health-related needs will improve Outcome: Adequate for Discharge

## 2023-07-16 NOTE — Care Management Important Message (Signed)
 Important Message  Patient Details  Name: Shelly Armstrong MRN: [id number] Date of Birth: May 19, 1945   Important Message Given:        Brookie Cantor, CMA 07/16/2023, 10:09 AM

## 2023-07-16 NOTE — Progress Notes (Signed)
 [facility]  ROUNDING NOTE   Subjective:   Patient sitting up in bed Partially completed breakfast tray at bedside Voices frustrations that she has not seen staff since lab draws this morning.   Sodium 128  Objective:  Vital signs in last 24 hours:  Temp:  [97.9 F (36.6 C)-98.4 F (36.9 C)] 98.2 F (36.8 C) (04/22 0848) Pulse Rate:  [72-87] 87 (04/22 0848) Resp:  [16-19] 16 (04/22 0848) BP: (127-145)/(52-105) 145/55 (04/22 0848) SpO2:  [94 %-100 %] 95 % (04/22 0848) Weight:  [69.8 kg] 69.8 kg (04/22 0500)  Weight change:  Filed Weights   07/08/23 2149 07/16/23 0500  Weight: 59.4 kg 69.8 kg    Intake/Output: I/O last 3 completed shifts: In: 900 [P.O.:900] Out: 1150 [Urine:1150]   Intake/Output this shift:  No intake/output data recorded.  Physical Exam: General: NAD, laying in bed  Head: Normocephalic, atraumatic. Moist oral mucosal membranes  Eyes: Anicteric  Lungs:  Clear to auscultation  Heart: Regular rate and rhythm  Abdomen:  Soft, nontender  Extremities: No peripheral edema.  Neurologic: Alert, moving all four extremities  Skin: No lesions       Basic Metabolic Panel: Recent Labs  Lab 07/12/23 0501 07/12/23 0930 07/14/23 0518 07/14/23 0845 07/14/23 1301 07/15/23 0308 07/16/23 0502 07/16/23 0852  NA 119*   < > 126* 125* 127* 129* 128* 130*  K 4.6  --  4.6  --   --  4.3 4.1 4.1  CL 92*  --  96*  --   --  96* 96* 96*  CO2 22  --  27  --   --  27 26 27   GLUCOSE 104*  --  105*  --   --  93 104* 138*  BUN 14  --  13  --   --  12 21 19   CREATININE 0.57  --  0.49  --   --  0.47 0.37* 0.52  CALCIUM  8.0*  --  8.3*  --   --  8.2* 8.4* 8.5*   < > = values in this interval not displayed.    Liver Function Tests: Recent Labs  Lab 07/10/23 0417 07/16/23 0852  AST 30 41  ALT 24 29  ALKPHOS 44 60  BILITOT 0.6 1.1  PROT 5.3* 5.1*  ALBUMIN 3.3* 3.1*   No results for input(s): "LIPASE", "AMYLASE" in the last 168 hours. No results  for input(s): "AMMONIA" in the last 168 hours.  CBC: Recent Labs  Lab 07/10/23 0417 07/11/23 0536 07/14/23 0518 07/14/23 1712 07/15/23 0308 07/16/23 0502  WBC 9.2 7.4 5.8  --  5.2 5.8  HGB 10.3* 8.3* 7.1* 8.8* 8.3* 8.2*  HCT 28.4* 22.8* 19.7* 25.2* 23.4* 23.6*  MCV 91.9 91.2 95.6  --  93.6 93.7  PLT 154 124* 172  --  177 206    Cardiac Enzymes: No results for input(s): "CKTOTAL", "CKMB", "CKMBINDEX", "TROPONINI" in the last 168 hours.  BNP: Invalid input(s): "POCBNP"  CBG: Recent Labs  Lab 07/11/23 1229  GLUCAP 147*    Microbiology: No results found for this or any previous visit.  Coagulation Studies: No results for input(s): "LABPROT", "INR" in the last 72 hours.  Urinalysis: No results for input(s): "COLORURINE", "LABSPEC", "PHURINE", "GLUCOSEU", "HGBUR", "BILIRUBINUR", "KETONESUR", "PROTEINUR", "UROBILINOGEN", "NITRITE", "LEUKOCYTESUR" in the last 72 hours.  Invalid input(s): "APPERANCEUR"    Imaging: No results found.    Medications:      aspirin  EC  81 mg Oral Daily   atorvastatin   40  mg Oral Daily   carvedilol   6.25 mg Oral BID WC   Chlorhexidine  Gluconate Cloth  6 each Topical Daily   enoxaparin  (LOVENOX ) injection  40 mg Subcutaneous Q24H   Fe Fum-Vit C-Vit B12-FA  1 capsule Oral BID   feeding supplement  237 mL Oral BID BM   irbesartan   300 mg Oral Daily   multivitamin with minerals  1 tablet Oral Daily   pantoprazole   40 mg Oral Daily   polyethylene glycol  17 g Oral BID   senna  2 tablet Oral Daily   sodium chloride   1 g Oral TID WC   SMOG  960 mL Rectal Once   acetaminophen , HYDROcodone -acetaminophen , labetalol , lactulose , menthol -cetylpyridinium **OR** phenol, methocarbamol  **OR** methocarbamol  (ROBAXIN ) injection, metoCLOPramide  **OR** metoCLOPramide  (REGLAN ) injection, ondansetron  (ZOFRAN ) IV, mouth rinse, sodium phosphate   Assessment/ Plan:  Shelly Armstrong is a 78 y.o.  female with medical problems of  hypertension,  hyperlipidemia presented for low back pain, chronic constipation.  She had an accidental fall which resulted in right intertrochanteric fracture.  She was admitted on 07/08/2023 for Hip fracture (HCC) [S72.009A] Hyponatremia [E87.1] Right hip pain [M25.551] Fall, initial encounter [W19.XXXA] Closed right hip fracture, initial encounter (HCC) [S72.001A] Pain in right femur [M89.8X5]   1.  Acute on chronic hyponatremia Patient appears to be euvolemic.  Serum osmolality at 253. Normal TSH 07/09/2023. A.m. cortisol was low on 416 but improved on 07/11/2023. Update: Sodium 128. Will order Tolvaptan  15mg  today. Baseline sodium 129-134.      2.  Right intertrochanteric fracture Patient underwent right hip intramedullary nail placement on 07/09/2023 by Dr. Clyda Dark.       3.  Anemia: NOS: Hemoglobin 8.2, acceptable for this patient.      LOS: 7 Shelly Armstrong 4/22/202511:52 AM

## 2023-07-16 NOTE — Care Management Important Message (Signed)
 Important Message  Patient Details  Name: Shelly Armstrong MRN: [id number] Date of Birth: Apr 04, 1945   Important Message Given:  Yes - Medicare IM     Boleslaw Borghi W, CMA 07/16/2023, 10:12 AM

## 2023-07-16 NOTE — TOC Progression Note (Addendum)
 Transition of Care [facility]) - Progression Note    Patient Details  Name: Shelly Armstrong MRN: [id number] Date of Birth: 10-09-1945  Transition of Care [facility]) CM/SW Contact  Crayton Docker, RN Phone Number: 07/16/2023, 11:10 AM  Clinical Narrative:     Call received from [facility], [employer]. Per Brian Campanile, EMS transport Siegfried Dress is [phone] and  SNF auth declined due to not medically necessary, Dr. Marysue Sola is offering a peer to peer. Per Brian Campanile, request for attending to please call Dr. Luster Salters,  at [phone]. CM alert to Dr. Ariel Begun regarding peer to peer request.   Call received from [facility] [employer] regarding CM fax number.   Alert received from Dr. Ariel Begun, [employer] will approve SNF. CM awaiting confirmation of SNF approval.  Expected Discharge Plan and Services    TBD, awaiting results of peer to peer     Social Determinants of Health (SDOH) Interventions SDOH Screenings   Food Insecurity: No Food Insecurity (07/11/2023)  Housing: Unknown (07/11/2023)  Transportation Needs: No Transportation Needs (07/11/2023)  Utilities: Not At Risk (07/11/2023)  Financial Resource Strain: Low Risk  (07/04/2023)   Received from [facility]  Social Connections: Moderately Isolated (07/11/2023)  Tobacco Use: Low Risk  (07/09/2023)    Readmission Risk Interventions     No data to display

## 2023-07-16 NOTE — Progress Notes (Signed)
 Physical Therapy Treatment Patient Details Name: Shelly Armstrong MRN: [id number] DOB: [birth year] Today's Date: 07/16/2023   History of Present Illness presented to ER status post mechanical fall in home environment, acute onset of R hip pain; admitted for management of closed intertrochanteric hip fracture, s/p IM nailing (07/09/23), WBAT. transfer to ICU for management of low sodium    PT Comments  Pt ready for session on BSC.  Some difficulty starting stream.  She is able to stand with light min a x 1 and asks for assist for care.  "I can't do it."  She is able to walk 50' x 1 with RW with slow gait but no LOB ot buckling.  Stated she had increased incisional area pain last night and it is carrying through this am.  Remained in chair after session with needs met.  Questions answered for transition and expectations at SNF.   If plan is discharge home, recommend the following: A little help with walking and/or transfers;Assistance with cooking/housework;Direct supervision/assist for medications management;Direct supervision/assist for financial management;Assist for transportation;Help with stairs or ramp for entrance;A little help with bathing/dressing/bathroom   Can travel by private vehicle        Equipment Recommendations       Recommendations for Other Services       Precautions / Restrictions Precautions Precautions: Fall Recall of Precautions/Restrictions: Intact Restrictions Weight Bearing Restrictions Per Provider Order: Yes RLE Weight Bearing Per Provider Order: Weight bearing as tolerated     Mobility  Bed Mobility               General bed mobility comments: on BSC upon arrival Patient Response: Cooperative  Transfers Overall transfer level: Needs assistance Equipment used: Rolling walker (2 wheels) Transfers: Sit to/from Stand Sit to Stand: Contact guard assist, Min assist                Ambulation/Gait Ambulation/Gait assistance: Min assist,  Contact guard assist Gait Distance (Feet): 40 Feet Assistive device: Rolling walker (2 wheels) Gait Pattern/deviations: Step-through pattern, Decreased step length - right, Decreased step length - left Gait velocity: decreased     General Gait Details: limited by fatigue adn some inc pain today L  hip - incisional area   Stairs             Wheelchair Mobility     Tilt Bed Tilt Bed Patient Response: Cooperative  Modified Rankin (Stroke Patients Only)       Balance Overall balance assessment: Needs assistance Sitting-balance support: No upper extremity supported, Feet supported Sitting balance-Leahy Scale: Good       Standing balance-Leahy Scale: Fair Standing balance comment: external support required for standing                            Communication Communication Communication: No apparent difficulties  Cognition Arousal: Alert Behavior During Therapy: WFL for tasks assessed/performed   PT - Cognitive impairments: No apparent impairments                                Cueing Cueing Techniques: Verbal cues  Exercises Other Exercises Other Exercises: void on BSC    General Comments        Pertinent Vitals/Pain Pain Assessment Pain Assessment: Faces Faces Pain Scale: Hurts even more Pain Location: R hip Pain Descriptors / Indicators: Aching, Discomfort Pain Intervention(s): Limited activity within patient's tolerance, Monitored  during session, Repositioned    Home Living                          Prior Function            PT Goals (current goals can now be found in the care plan section) Progress towards PT goals: Progressing toward goals    Frequency    7X/week      PT Plan      Co-evaluation              AM-PAC PT "6 Clicks" Mobility   Outcome Measure  Help needed turning from your back to your side while in a flat bed without using bedrails?: A Little Help needed moving from lying on  your back to sitting on the side of a flat bed without using bedrails?: A Little Help needed moving to and from a bed to a chair (including a wheelchair)?: A Little Help needed standing up from a chair using your arms (e.g., wheelchair or bedside chair)?: A Little Help needed to walk in hospital room?: A Little Help needed climbing 3-5 steps with a railing? : A Lot 6 Click Score: 17    End of Session Equipment Utilized During Treatment: Gait belt Activity Tolerance: Patient tolerated treatment well Patient left: in chair;with call bell/phone within reach;with family/visitor present Nurse Communication: Mobility status PT Visit Diagnosis: Muscle weakness (generalized) (M62.81);Difficulty in walking, not elsewhere classified (R26.2);Pain Pain - Right/Left: Right Pain - part of body: Hip     Time: 1012-1030 PT Time Calculation (min) (ACUTE ONLY): 18 min  Charges:    $Gait Training: 8-22 mins PT General Charges $$ ACUTE PT VISIT: 1 Visit                   Charlanne Cong, PTA 07/16/23, 10:39 AM

## 2023-07-16 NOTE — Progress Notes (Signed)
 The Clinical research associate called [employer] and gave report to [place], Charity fundraiser. Pt will be going to room 609. Pt is in room she denies and pain at this times Will continue to monitor.

## 2023-07-19 DIAGNOSIS — K5900 Constipation, unspecified: Secondary | ICD-10-CM | POA: Diagnosis not present

## 2023-07-19 DIAGNOSIS — S72141D Displaced intertrochanteric fracture of right femur, subsequent encounter for closed fracture with routine healing: Secondary | ICD-10-CM | POA: Diagnosis not present

## 2023-07-19 DIAGNOSIS — W19XXXD Unspecified fall, subsequent encounter: Secondary | ICD-10-CM | POA: Diagnosis not present

## 2023-07-19 DIAGNOSIS — I160 Hypertensive urgency: Secondary | ICD-10-CM | POA: Diagnosis not present

## 2023-07-19 DIAGNOSIS — E871 Hypo-osmolality and hyponatremia: Secondary | ICD-10-CM | POA: Diagnosis not present

## 2023-07-19 DIAGNOSIS — D62 Acute posthemorrhagic anemia: Secondary | ICD-10-CM | POA: Diagnosis not present

## 2023-07-19 DIAGNOSIS — R600 Localized edema: Secondary | ICD-10-CM | POA: Diagnosis not present

## 2023-07-22 DIAGNOSIS — W19XXXD Unspecified fall, subsequent encounter: Secondary | ICD-10-CM | POA: Diagnosis not present

## 2023-07-22 DIAGNOSIS — S72141D Displaced intertrochanteric fracture of right femur, subsequent encounter for closed fracture with routine healing: Secondary | ICD-10-CM | POA: Diagnosis not present

## 2023-07-22 DIAGNOSIS — R600 Localized edema: Secondary | ICD-10-CM | POA: Diagnosis not present

## 2023-07-22 DIAGNOSIS — E871 Hypo-osmolality and hyponatremia: Secondary | ICD-10-CM | POA: Diagnosis not present

## 2023-07-22 DIAGNOSIS — D62 Acute posthemorrhagic anemia: Secondary | ICD-10-CM | POA: Diagnosis not present

## 2023-07-22 DIAGNOSIS — K5900 Constipation, unspecified: Secondary | ICD-10-CM | POA: Diagnosis not present

## 2023-07-22 DIAGNOSIS — I160 Hypertensive urgency: Secondary | ICD-10-CM | POA: Diagnosis not present

## 2023-07-24 DIAGNOSIS — I10 Essential (primary) hypertension: Secondary | ICD-10-CM | POA: Diagnosis not present

## 2023-07-24 DIAGNOSIS — R600 Localized edema: Secondary | ICD-10-CM | POA: Diagnosis not present

## 2023-07-24 DIAGNOSIS — K5900 Constipation, unspecified: Secondary | ICD-10-CM | POA: Diagnosis not present

## 2023-07-24 DIAGNOSIS — W19XXXD Unspecified fall, subsequent encounter: Secondary | ICD-10-CM | POA: Diagnosis not present

## 2023-07-24 DIAGNOSIS — D62 Acute posthemorrhagic anemia: Secondary | ICD-10-CM | POA: Diagnosis not present

## 2023-07-24 DIAGNOSIS — E871 Hypo-osmolality and hyponatremia: Secondary | ICD-10-CM | POA: Diagnosis not present

## 2023-07-24 DIAGNOSIS — S72141D Displaced intertrochanteric fracture of right femur, subsequent encounter for closed fracture with routine healing: Secondary | ICD-10-CM | POA: Diagnosis not present

## 2023-07-25 DIAGNOSIS — S72141D Displaced intertrochanteric fracture of right femur, subsequent encounter for closed fracture with routine healing: Secondary | ICD-10-CM | POA: Diagnosis not present

## 2023-07-25 DIAGNOSIS — R600 Localized edema: Secondary | ICD-10-CM | POA: Diagnosis not present

## 2023-07-25 DIAGNOSIS — I10 Essential (primary) hypertension: Secondary | ICD-10-CM | POA: Diagnosis not present

## 2023-07-25 DIAGNOSIS — E871 Hypo-osmolality and hyponatremia: Secondary | ICD-10-CM | POA: Diagnosis not present

## 2023-07-25 DIAGNOSIS — D62 Acute posthemorrhagic anemia: Secondary | ICD-10-CM | POA: Diagnosis not present

## 2023-07-26 DIAGNOSIS — E871 Hypo-osmolality and hyponatremia: Secondary | ICD-10-CM | POA: Diagnosis not present

## 2023-07-26 DIAGNOSIS — K5900 Constipation, unspecified: Secondary | ICD-10-CM | POA: Diagnosis not present

## 2023-07-26 DIAGNOSIS — R600 Localized edema: Secondary | ICD-10-CM | POA: Diagnosis not present

## 2023-07-26 DIAGNOSIS — S72141D Displaced intertrochanteric fracture of right femur, subsequent encounter for closed fracture with routine healing: Secondary | ICD-10-CM | POA: Diagnosis not present

## 2023-07-26 DIAGNOSIS — I10 Essential (primary) hypertension: Secondary | ICD-10-CM | POA: Diagnosis not present

## 2023-07-26 DIAGNOSIS — D62 Acute posthemorrhagic anemia: Secondary | ICD-10-CM | POA: Diagnosis not present

## 2023-07-29 DIAGNOSIS — K5900 Constipation, unspecified: Secondary | ICD-10-CM | POA: Diagnosis not present

## 2023-07-29 DIAGNOSIS — D62 Acute posthemorrhagic anemia: Secondary | ICD-10-CM | POA: Diagnosis not present

## 2023-07-29 DIAGNOSIS — R600 Localized edema: Secondary | ICD-10-CM | POA: Diagnosis not present

## 2023-07-29 DIAGNOSIS — E871 Hypo-osmolality and hyponatremia: Secondary | ICD-10-CM | POA: Diagnosis not present

## 2023-07-29 DIAGNOSIS — S72141D Displaced intertrochanteric fracture of right femur, subsequent encounter for closed fracture with routine healing: Secondary | ICD-10-CM | POA: Diagnosis not present

## 2023-07-29 DIAGNOSIS — I10 Essential (primary) hypertension: Secondary | ICD-10-CM | POA: Diagnosis not present

## 2023-07-31 DIAGNOSIS — S72141D Displaced intertrochanteric fracture of right femur, subsequent encounter for closed fracture with routine healing: Secondary | ICD-10-CM | POA: Diagnosis not present

## 2023-07-31 DIAGNOSIS — S72001A Fracture of unspecified part of neck of right femur, initial encounter for closed fracture: Secondary | ICD-10-CM | POA: Diagnosis not present

## 2023-07-31 DIAGNOSIS — D62 Acute posthemorrhagic anemia: Secondary | ICD-10-CM | POA: Diagnosis not present

## 2023-07-31 DIAGNOSIS — I10 Essential (primary) hypertension: Secondary | ICD-10-CM | POA: Diagnosis not present

## 2023-07-31 DIAGNOSIS — E871 Hypo-osmolality and hyponatremia: Secondary | ICD-10-CM | POA: Diagnosis not present

## 2023-07-31 DIAGNOSIS — K5900 Constipation, unspecified: Secondary | ICD-10-CM | POA: Diagnosis not present

## 2023-07-31 DIAGNOSIS — R600 Localized edema: Secondary | ICD-10-CM | POA: Diagnosis not present

## 2023-08-05 DIAGNOSIS — R600 Localized edema: Secondary | ICD-10-CM | POA: Diagnosis not present

## 2023-08-05 DIAGNOSIS — K5900 Constipation, unspecified: Secondary | ICD-10-CM | POA: Diagnosis not present

## 2023-08-05 DIAGNOSIS — I10 Essential (primary) hypertension: Secondary | ICD-10-CM | POA: Diagnosis not present

## 2023-08-05 DIAGNOSIS — E871 Hypo-osmolality and hyponatremia: Secondary | ICD-10-CM | POA: Diagnosis not present

## 2023-08-05 DIAGNOSIS — S72141D Displaced intertrochanteric fracture of right femur, subsequent encounter for closed fracture with routine healing: Secondary | ICD-10-CM | POA: Diagnosis not present

## 2023-08-05 DIAGNOSIS — D62 Acute posthemorrhagic anemia: Secondary | ICD-10-CM | POA: Diagnosis not present

## 2023-08-05 DIAGNOSIS — Z9181 History of falling: Secondary | ICD-10-CM | POA: Diagnosis not present

## 2023-08-09 DIAGNOSIS — M8580 Other specified disorders of bone density and structure, unspecified site: Secondary | ICD-10-CM | POA: Diagnosis not present

## 2023-08-09 DIAGNOSIS — Z6824 Body mass index (BMI) 24.0-24.9, adult: Secondary | ICD-10-CM | POA: Diagnosis not present

## 2023-08-09 DIAGNOSIS — E440 Moderate protein-calorie malnutrition: Secondary | ICD-10-CM | POA: Diagnosis not present

## 2023-08-09 DIAGNOSIS — E871 Hypo-osmolality and hyponatremia: Secondary | ICD-10-CM | POA: Diagnosis not present

## 2023-08-09 DIAGNOSIS — I10 Essential (primary) hypertension: Secondary | ICD-10-CM | POA: Diagnosis not present

## 2023-08-09 DIAGNOSIS — Z9181 History of falling: Secondary | ICD-10-CM | POA: Diagnosis not present

## 2023-08-09 DIAGNOSIS — Z90710 Acquired absence of both cervix and uterus: Secondary | ICD-10-CM | POA: Diagnosis not present

## 2023-08-09 DIAGNOSIS — E785 Hyperlipidemia, unspecified: Secondary | ICD-10-CM | POA: Diagnosis not present

## 2023-08-09 DIAGNOSIS — D62 Acute posthemorrhagic anemia: Secondary | ICD-10-CM | POA: Diagnosis not present

## 2023-08-09 DIAGNOSIS — K5900 Constipation, unspecified: Secondary | ICD-10-CM | POA: Diagnosis not present

## 2023-08-09 DIAGNOSIS — S72141D Displaced intertrochanteric fracture of right femur, subsequent encounter for closed fracture with routine healing: Secondary | ICD-10-CM | POA: Diagnosis not present

## 2023-08-09 DIAGNOSIS — Z9089 Acquired absence of other organs: Secondary | ICD-10-CM | POA: Diagnosis not present

## 2023-08-12 ENCOUNTER — Other Ambulatory Visit: Payer: Self-pay

## 2023-08-12 ENCOUNTER — Encounter: Payer: Self-pay | Admitting: Emergency Medicine

## 2023-08-12 ENCOUNTER — Emergency Department
Admission: EM | Admit: 2023-08-12 | Discharge: 2023-08-12 | Disposition: A | Discharge status: Home / Self Care | Attending: Emergency Medicine | Admitting: Emergency Medicine

## 2023-08-12 DIAGNOSIS — R030 Elevated blood-pressure reading, without diagnosis of hypertension: Secondary | ICD-10-CM | POA: Diagnosis present

## 2023-08-12 DIAGNOSIS — I10 Essential (primary) hypertension: Secondary | ICD-10-CM | POA: Insufficient documentation

## 2023-08-12 LAB — BASIC METABOLIC PANEL WITH GFR
Anion gap: 10 (ref 5–15)
BUN: 20 mg/dL (ref 8–23)
CO2: 23 mmol/L (ref 22–32)
Calcium: 9.1 mg/dL (ref 8.9–10.3)
Chloride: 98 mmol/L (ref 98–111)
Creatinine, Ser: 0.37 mg/dL — ABNORMAL LOW (ref 0.44–1.00)
GFR, Estimated: >60 mL/min (ref >60)
Glucose, Bld: 139 mg/dL — ABNORMAL HIGH (ref 70–99)
Potassium: 3.7 mmol/L (ref 3.5–5.1)
Sodium: 131 mmol/L — ABNORMAL LOW (ref 135–145)

## 2023-08-12 LAB — CBC WITH DIFFERENTIAL/PLATELET
Abs Immature Granulocytes: 0.01 10*3/uL (ref 0.00–0.07)
Basophils Absolute: 0 10*3/uL (ref 0.0–0.1)
Basophils Relative: 1 %
Eosinophils Absolute: 0.1 10*3/uL (ref 0.0–0.5)
Eosinophils Relative: 3 %
HCT: 35.3 % — ABNORMAL LOW (ref 36.0–46.0)
Hemoglobin: 11.9 g/dL — ABNORMAL LOW (ref 12.0–15.0)
Immature Granulocytes: 0 %
Lymphocytes Relative: 26 %
Lymphs Abs: 0.9 10*3/uL (ref 0.7–4.0)
MCH: 34.4 pg — ABNORMAL HIGH (ref 26.0–34.0)
MCHC: 33.7 g/dL (ref 30.0–36.0)
MCV: 102 fL — ABNORMAL HIGH (ref 80.0–100.0)
Monocytes Absolute: 0.4 10*3/uL (ref 0.1–1.0)
Monocytes Relative: 10 %
Neutro Abs: 2.2 10*3/uL (ref 1.7–7.7)
Neutrophils Relative %: 60 %
Platelets: 231 10*3/uL (ref 150–400)
RBC: 3.46 MIL/uL — ABNORMAL LOW (ref 3.87–5.11)
RDW: 15.9 % — ABNORMAL HIGH (ref 11.5–15.5)
WBC: 3.6 10*3/uL — ABNORMAL LOW (ref 4.0–10.5)
nRBC: 0 % (ref 0.0–0.2)

## 2023-08-12 MED ORDER — CARVEDILOL 12.5 MG PO TABS: 6.2500 mg | ORAL_TABLET | Freq: Two times a day (BID) | ORAL | 1 refills | Status: DC

## 2023-08-12 MED ORDER — CEPHALEXIN 500 MG PO CAPS: 500.0000 mg | ORAL_CAPSULE | Freq: Three times a day (TID) | ORAL | 0 refills | Status: AC

## 2023-08-12 MED ORDER — IRBESARTAN 300 MG PO TABS: 300.0000 mg | ORAL_TABLET | Freq: Every day | ORAL | 1 refills | Status: AC

## 2023-08-12 MED ORDER — CARVEDILOL 12.5 MG PO TABS: 12.5000 mg | ORAL_TABLET | Freq: Two times a day (BID) | ORAL | 1 refills | Status: AC

## 2023-08-12 NOTE — ED Notes (Signed)
 See triage notes. Patient c/o multiple issues. Patient c/o right leg pain, inability to pee, and hypertension.

## 2023-08-12 NOTE — ED Provider Notes (Signed)
 [facility] Provider Note    Event Date/Time   First MD Initiated Contact with Patient 08/12/23 1313     (approximate)   History   Hypertension (/)   HPI  Shelly Armstrong is a 78 y.o. female with history of hypertension, hyperlipidemia, recent surgical repair of her right hip who presents with elevated blood pressure.  She reports home nurse took her blood pressure and found to be elevated and referred her to the emergency department.  Overall she feels well and has no complaints besides continued pain in her right leg which she thinks is related to her elevated blood pressure.  She is compliant with her medications.  Review of records demonstrates recent hospitalization for hyponatremia     Physical Exam   Triage Vital Signs: ED Triage Vitals [08/12/23 1239]  Encounter Vitals Group     BP (!) 182/69     Systolic BP Percentile      Diastolic BP Percentile      Pulse Rate 66     Resp 16     Temp 98.2 F (36.8 C)     Temp Source Oral     SpO2 96 %     Weight 69.8 kg (153 lb 14.1 oz)     Height 1.549 m (5\' 1" )     Head Circumference      Peak Flow      Pain Score 0     Pain Loc      Pain Education      Exclude from Growth Chart     Most recent vital signs: Vitals:   08/12/23 1239 08/12/23 1417  BP: (!) 182/69 (!) 188/81  Pulse: 66 70  Resp: 16 18  Temp: 98.2 F (36.8 C)   SpO2: 96% 100%     General: Awake, no distress.  CV:  Good peripheral perfusion.  Resp:  Normal effort.  Abd:  No distention.  Other:     ED Results / Procedures / Treatments   Labs (all labs ordered are listed, but only abnormal results are displayed) Labs Reviewed  CBC WITH DIFFERENTIAL/PLATELET - Abnormal; Notable for the following components:      Result Value   WBC 3.6 (*)    RBC 3.46 (*)    Hemoglobin 11.9 (*)    HCT 35.3 (*)    MCV 102.0 (*)    MCH 34.4 (*)    RDW 15.9 (*)    All other components within normal limits  BASIC METABOLIC PANEL  WITH GFR - Abnormal; Notable for the following components:   Sodium 131 (*)    Glucose, Bld 139 (*)    Creatinine, Ser 0.37 (*)    All other components within normal limits     EKG     RADIOLOGY     PROCEDURES:  Critical Care performed:   Procedures   MEDICATIONS ORDERED IN ED: Medications - No data to display   IMPRESSION / MDM / ASSESSMENT AND PLAN / ED COURSE  I reviewed the triage vital signs and the nursing notes. Patient's presentation is most consistent with exacerbation of chronic illness.  Patient presents with elevated blood pressure as detailed above, she reports compliance with her medications but is also suffering from pain in her right hip.  I suspect this has to do with her elevated blood pressure.  Lab work reviewed and is overall reassuring, she is asymptomatic here.  I will increase her carvedilol , refill provided for her Avapro , she  also complains of dysuria for which we have prescribed Keflex         FINAL CLINICAL IMPRESSION(S) / ED DIAGNOSES   Final diagnoses:  Primary hypertension     Rx / DC Orders   ED Discharge Orders          Ordered    irbesartan  (AVAPRO ) 300 MG tablet  Daily        08/12/23 1407    carvedilol  (COREG ) 12.5 MG tablet  2 times daily with meals,   Status:  Discontinued        08/12/23 1407    cephALEXin  (KEFLEX ) 500 MG capsule  3 times daily        08/12/23 1407    carvedilol  (COREG ) 12.5 MG tablet  2 times daily with meals        08/12/23 1407             Note:  This document was prepared using Dragon voice recognition software and may include unintentional dictation errors.   Bryson Carbine, MD 08/12/23 1530

## 2023-08-12 NOTE — ED Triage Notes (Signed)
 Pt to ED via POV. Pts husband states that OT came out for a visit today and when they did pts BP was 200/86. Pt had already taken her medication today. Pt does not have any other symptoms at this time. Pt is in NAD.

## 2023-08-15 DIAGNOSIS — K5900 Constipation, unspecified: Secondary | ICD-10-CM | POA: Diagnosis not present

## 2023-08-15 DIAGNOSIS — S72141D Displaced intertrochanteric fracture of right femur, subsequent encounter for closed fracture with routine healing: Secondary | ICD-10-CM | POA: Diagnosis not present

## 2023-08-15 DIAGNOSIS — D62 Acute posthemorrhagic anemia: Secondary | ICD-10-CM | POA: Diagnosis not present

## 2023-08-15 DIAGNOSIS — I10 Essential (primary) hypertension: Secondary | ICD-10-CM | POA: Diagnosis not present

## 2023-08-15 DIAGNOSIS — E785 Hyperlipidemia, unspecified: Secondary | ICD-10-CM | POA: Diagnosis not present

## 2023-08-15 DIAGNOSIS — M8580 Other specified disorders of bone density and structure, unspecified site: Secondary | ICD-10-CM | POA: Diagnosis not present

## 2023-08-15 DIAGNOSIS — E871 Hypo-osmolality and hyponatremia: Secondary | ICD-10-CM | POA: Diagnosis not present

## 2023-08-22 DIAGNOSIS — M8580 Other specified disorders of bone density and structure, unspecified site: Secondary | ICD-10-CM | POA: Diagnosis not present

## 2023-08-22 DIAGNOSIS — M25551 Pain in right hip: Secondary | ICD-10-CM | POA: Diagnosis not present

## 2023-08-22 DIAGNOSIS — I10 Essential (primary) hypertension: Secondary | ICD-10-CM | POA: Diagnosis not present

## 2023-08-22 DIAGNOSIS — R350 Frequency of micturition: Secondary | ICD-10-CM | POA: Diagnosis not present

## 2023-08-28 DIAGNOSIS — S72001A Fracture of unspecified part of neck of right femur, initial encounter for closed fracture: Secondary | ICD-10-CM | POA: Diagnosis not present

## 2023-09-03 ENCOUNTER — Encounter: Payer: Self-pay | Admitting: *Deleted

## 2023-09-20 ENCOUNTER — Ambulatory Visit: Admission: RE | Admit: 2023-09-20 | Source: Home / Self Care

## 2023-09-20 HISTORY — DX: Prediabetes: R73.03

## 2023-09-20 SURGERY — COLONOSCOPY
Anesthesia: General

## 2023-10-09 DIAGNOSIS — R7303 Prediabetes: Secondary | ICD-10-CM | POA: Diagnosis not present

## 2023-10-09 DIAGNOSIS — I10 Essential (primary) hypertension: Secondary | ICD-10-CM | POA: Diagnosis not present

## 2023-10-16 DIAGNOSIS — E782 Mixed hyperlipidemia: Secondary | ICD-10-CM | POA: Diagnosis not present

## 2023-10-16 DIAGNOSIS — Z0000 Encounter for general adult medical examination without abnormal findings: Secondary | ICD-10-CM | POA: Diagnosis not present

## 2023-10-16 DIAGNOSIS — I10 Essential (primary) hypertension: Secondary | ICD-10-CM | POA: Diagnosis not present

## 2023-10-16 DIAGNOSIS — R7303 Prediabetes: Secondary | ICD-10-CM | POA: Diagnosis not present

## 2023-10-16 DIAGNOSIS — Z1331 Encounter for screening for depression: Secondary | ICD-10-CM | POA: Diagnosis not present

## 2023-11-12 DIAGNOSIS — M7061 Trochanteric bursitis, right hip: Secondary | ICD-10-CM | POA: Diagnosis not present

## 2023-11-12 DIAGNOSIS — S72001D Fracture of unspecified part of neck of right femur, subsequent encounter for closed fracture with routine healing: Secondary | ICD-10-CM | POA: Diagnosis not present

## 2024-01-17 DIAGNOSIS — M1711 Unilateral primary osteoarthritis, right knee: Secondary | ICD-10-CM | POA: Diagnosis not present

## 2024-01-17 DIAGNOSIS — S72001D Fracture of unspecified part of neck of right femur, subsequent encounter for closed fracture with routine healing: Secondary | ICD-10-CM | POA: Diagnosis not present
